# Patient Record
Sex: Male | Born: 1995
Health system: Southern US, Community
[De-identification: ages and names within clinical notes are randomized; demographics above are authoritative.]

## PROBLEM LIST (undated history)

## (undated) DIAGNOSIS — F419 Anxiety disorder, unspecified: Secondary | ICD-10-CM

## (undated) DIAGNOSIS — IMO0001 Reserved for inherently not codable concepts without codable children: Secondary | ICD-10-CM

## (undated) DIAGNOSIS — K219 Gastro-esophageal reflux disease without esophagitis: Secondary | ICD-10-CM

## (undated) HISTORY — DX: Anxiety disorder, unspecified: F41.9

## (undated) HISTORY — PX: WISDOM TOOTH EXTRACTION: SHX21

## (undated) HISTORY — PX: TONSILLECTOMY: SUR1361

---
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
## 2016-02-13 ENCOUNTER — Emergency Department (HOSPITAL_BASED_OUTPATIENT_CLINIC_OR_DEPARTMENT_OTHER)
Admission: EM | Admit: 2016-02-13 | Discharge: 2016-02-13 | Disposition: A | Discharge status: Home / Self Care | Payer: 59 | Attending: Emergency Medicine | Admitting: Emergency Medicine

## 2016-02-13 ENCOUNTER — Encounter (HOSPITAL_BASED_OUTPATIENT_CLINIC_OR_DEPARTMENT_OTHER): Payer: Self-pay | Admitting: *Deleted

## 2016-02-13 DIAGNOSIS — H66001 Acute suppurative otitis media without spontaneous rupture of ear drum, right ear: Secondary | ICD-10-CM | POA: Diagnosis not present

## 2016-02-13 DIAGNOSIS — Z79899 Other long term (current) drug therapy: Secondary | ICD-10-CM | POA: Insufficient documentation

## 2016-02-13 DIAGNOSIS — H9201 Otalgia, right ear: Secondary | ICD-10-CM | POA: Diagnosis present

## 2016-02-13 HISTORY — DX: Reserved for inherently not codable concepts without codable children: IMO0001

## 2016-02-13 HISTORY — DX: Gastro-esophageal reflux disease without esophagitis: K21.9

## 2016-02-13 MED ORDER — HYDROCODONE-ACETAMINOPHEN 5-325 MG PO TABS: 1.0000 | ORAL_TABLET | ORAL | 0 refills | Status: DC | PRN

## 2016-02-13 MED ORDER — AMOXICILLIN 500 MG PO CAPS: 500.0000 mg | ORAL_CAPSULE | Freq: Three times a day (TID) | ORAL | 0 refills | Status: DC

## 2016-02-13 MED ORDER — IBUPROFEN 800 MG PO TABS
800.0000 mg | ORAL_TABLET | Freq: Once | ORAL | Status: AC
  Administered 2016-02-13: 800 mg via ORAL
  Filled 2016-02-13: qty 1

## 2016-02-13 MED ORDER — AMOXICILLIN 500 MG PO CAPS
500.0000 mg | ORAL_CAPSULE | Freq: Once | ORAL | Status: AC
  Administered 2016-02-13: 500 mg via ORAL
  Filled 2016-02-13: qty 1

## 2016-02-13 MED ORDER — IBUPROFEN 800 MG PO TABS: 800.0000 mg | ORAL_TABLET | Freq: Three times a day (TID) | ORAL | 0 refills | Status: DC

## 2016-02-13 NOTE — ED Provider Notes (Signed)
[facility]   CSN: [id number] Arrival date & time: 02/13/16  0807  First Provider Contact:  None       History   Chief Complaint Chief Complaint  Patient presents with  . Otalgia    right    HPI Tracy Rich is a 20 y.o. male.  Pt said that he woke up around 0300 with right ear pain.  He said that he felt a loud pop.  He denies any trouble hearing.  He said that the pain is severe.      Past Medical History:  Diagnosis Date  . Reflux     There are no active problems to display for this patient.   Past Surgical History:  Procedure Laterality Date  . TONSILLECTOMY    . WISDOM TOOTH EXTRACTION         Home Medications    Prior to Admission medications   Medication Sig Start Date End Date Taking? Authorizing Provider  RaNITidine HCl (ZANTAC PO) Take by mouth.   Yes Historical Provider, MD  amoxicillin (AMOXIL) 500 MG capsule Take 1 capsule (500 mg total) by mouth 3 (three) times daily. 02/13/16   Jacalyn Lefevre, MD  HYDROcodone-acetaminophen (NORCO/VICODIN) 5-325 MG tablet Take 1 tablet by mouth every 4 (four) hours as needed. 02/13/16   Jacalyn Lefevre, MD  ibuprofen (ADVIL,MOTRIN) 800 MG tablet Take 1 tablet (800 mg total) by mouth 3 (three) times daily. 02/13/16   Jacalyn Lefevre, MD    Family History No family history on file.  Social History Social History  Substance Use Topics  . Smoking status: Not on file  . Smokeless tobacco: Not on file  . Alcohol use Not on file     Allergies   Review of patient's allergies indicates no known allergies.   Review of Systems Review of Systems  HENT: Positive for ear pain.   All other systems reviewed and are negative.    Physical Exam Updated Vital Signs BP 129/99 (BP Location: Right Arm)   Pulse 73   Temp 98.2 F (36.8 C)   Resp 22   Ht 6' (1.829 m)   Wt 250 lb (113.4 kg)   SpO2 100%   BMI 33.91 kg/m   Physical Exam  Constitutional: He appears well-developed and  well-nourished.  HENT:  Head: Normocephalic and atraumatic.  Right Ear: External ear normal. Tympanic membrane is injected, erythematous and bulging. Tympanic membrane is not perforated.  Left Ear: Tympanic membrane and external ear normal.  Nose: Nose normal.  Mouth/Throat: Oropharynx is clear and moist.  Eyes: Conjunctivae and EOM are normal. Pupils are equal, round, and reactive to light.  Neck: Normal range of motion. Neck supple.  Cardiovascular: Normal rate, regular rhythm, normal heart sounds and intact distal pulses.   Pulmonary/Chest: Effort normal and breath sounds normal.  Abdominal: Soft. Bowel sounds are normal.  Nursing note and vitals reviewed.    ED Treatments / Results  Labs (all labs ordered are listed, but only abnormal results are displayed) Labs Reviewed - No data to display  EKG  EKG Interpretation None       Radiology No results found.  Procedures Procedures (including critical care time)  Medications Ordered in ED Medications  amoxicillin (AMOXIL) capsule 500 mg (not administered)  ibuprofen (ADVIL,MOTRIN) tablet 800 mg (not administered)     Initial Impression / Assessment and Plan / ED Course  I have reviewed the triage vital signs and the nursing notes.  Pertinent labs & imaging  results that were available during my care of the patient were reviewed by me and considered in my medical decision making (see chart for details).  Clinical Course   Pt will be placed on amox and is given a rx for lortab and ibuprofen for pain.  Return if worse.  Final Clinical Impressions(s) / ED Diagnoses   Final diagnoses:  Acute suppurative otitis media of right ear without spontaneous rupture of tympanic membrane, recurrence not specified    New Prescriptions New Prescriptions   AMOXICILLIN (AMOXIL) 500 MG CAPSULE    Take 1 capsule (500 mg total) by mouth 3 (three) times daily.   HYDROCODONE-ACETAMINOPHEN (NORCO/VICODIN) 5-325 MG TABLET    Take 1  tablet by mouth every 4 (four) hours as needed.   IBUPROFEN (ADVIL,MOTRIN) 800 MG TABLET    Take 1 tablet (800 mg total) by mouth 3 (three) times daily.     Jacalyn LefevreJulie Rohen Kimes, MD 02/13/16 [phone]

## 2016-02-13 NOTE — ED Triage Notes (Signed)
Patient states approximately 2 hours ago while sleeping, he was awakened by a loud pop in his right ear, and now has severe pain.

## 2016-09-06 DIAGNOSIS — J029 Acute pharyngitis, unspecified: Secondary | ICD-10-CM | POA: Diagnosis not present

## 2016-11-16 DIAGNOSIS — L0390 Cellulitis, unspecified: Secondary | ICD-10-CM | POA: Diagnosis not present

## 2016-11-16 DIAGNOSIS — B9562 Methicillin resistant Staphylococcus aureus infection as the cause of diseases classified elsewhere: Secondary | ICD-10-CM | POA: Diagnosis not present

## 2016-11-16 DIAGNOSIS — L0291 Cutaneous abscess, unspecified: Secondary | ICD-10-CM | POA: Diagnosis not present

## 2016-11-22 DIAGNOSIS — L02416 Cutaneous abscess of left lower limb: Secondary | ICD-10-CM | POA: Diagnosis not present

## 2017-01-27 DIAGNOSIS — J019 Acute sinusitis, unspecified: Secondary | ICD-10-CM | POA: Diagnosis not present

## 2017-01-27 DIAGNOSIS — H6693 Otitis media, unspecified, bilateral: Secondary | ICD-10-CM | POA: Diagnosis not present

## 2017-02-20 DIAGNOSIS — L02416 Cutaneous abscess of left lower limb: Secondary | ICD-10-CM | POA: Diagnosis not present

## 2017-02-20 DIAGNOSIS — R062 Wheezing: Secondary | ICD-10-CM | POA: Diagnosis not present

## 2017-02-21 ENCOUNTER — Emergency Department
Admission: EM | Admit: 2017-02-21 | Discharge: 2017-02-21 | Disposition: A | Discharge status: Home / Self Care | Payer: 59 | Source: Home / Self Care | Attending: Family Medicine | Admitting: Family Medicine

## 2017-02-21 ENCOUNTER — Encounter: Payer: Self-pay | Admitting: Emergency Medicine

## 2017-02-21 DIAGNOSIS — L02416 Cutaneous abscess of left lower limb: Secondary | ICD-10-CM | POA: Diagnosis not present

## 2017-02-21 MED ORDER — HYDROCODONE-ACETAMINOPHEN 5-325 MG PO TABS: 1.0000 | ORAL_TABLET | ORAL | 0 refills | Status: DC | PRN

## 2017-02-21 NOTE — ED Provider Notes (Signed)
[facility]    CSN: [id number] Arrival date & time: 02/21/17  1736     History   Chief Complaint Chief Complaint  Patient presents with  . Abscess    HPI Tracy Rich is a 21 y.o. male.   HPI Tracy Rich is a 21 y.o. male presenting to UC with c/o gradually worsening abscess of Left medial thigh that started about 3 days ago.  He [facility] at [facility] yesterday and prescribed Clindamycin.  He has been taking as prescribed and using warm compresses but states the pain is only worsening.  Denies fever, chills, n/v/d. 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  He has had them drained in the past.    Past Medical History:  Diagnosis Date  . Reflux     There are no active problems to display for this patient.   Past Surgical History:  Procedure Laterality Date  . TONSILLECTOMY    . WISDOM TOOTH EXTRACTION         Home Medications    Prior to Admission medications   Medication Sig Start Date End Date Taking? Authorizing Provider  HYDROcodone-acetaminophen (NORCO/VICODIN) 5-325 MG tablet Take 1-2 tablets by mouth every 4 (four) hours as needed for severe pain. 02/21/17   Lurene Shadow, PA-C  ibuprofen (ADVIL,MOTRIN) 800 MG tablet Take 1 tablet (800 mg total) by mouth 3 (three) times daily. 02/13/16   Jacalyn Lefevre, MD  RaNITidine HCl (ZANTAC PO) Take by mouth.    [provider]    Family History History reviewed. No pertinent family history.  Social History Social History  Substance Use Topics  . Smoking status: Former Games developer  . Smokeless tobacco: Never Used  . Alcohol use No     Allergies   Patient has no known allergies.   Review of Systems Review of Systems  Constitutional: Negative for chills and fever.  Gastrointestinal: Positive for nausea. Negative for diarrhea and vomiting.  Skin: Positive for color change and wound. Negative for rash.     Physical Exam Triage Vital Signs ED Triage Vitals  Enc Vitals Group    BP 02/21/17 1756 129/90     Pulse Rate 02/21/17 1756 87     Resp --      Temp 02/21/17 1756 98.1 F (36.7 C)     Temp Source 02/21/17 1756 Oral     SpO2 02/21/17 1756 100 %     Weight 02/21/17 1757 260 lb (117.9 kg)     Height 02/21/17 1757 6\' 1"  (1.854 m)     Head Circumference --      Peak Flow --      Pain Score 02/21/17 1757 8     Pain Loc --      Pain Edu? --      Excl. in GC? --    No data found.   Updated Vital Signs BP 129/90 (BP Location: Left Arm)   Pulse 87   Temp 98.1 F (36.7 C) (Oral)   Ht 6\' 1"  (1.854 m)   Wt 260 lb (117.9 kg)   SpO2 100%   BMI 34.30 kg/m   Visual Acuity Right Eye Distance:   Left Eye Distance:   Bilateral Distance:    Right Eye Near:   Left Eye Near:    Bilateral Near:     Physical Exam  Constitutional: He is oriented to person, place, and time. He appears well-developed and well-nourished. No distress.  HENT:  Head: Normocephalic and  atraumatic.  Eyes: EOM are normal.  Neck: Normal range of motion.  Cardiovascular: Normal rate and regular rhythm.   Pulmonary/Chest: Effort normal.  Musculoskeletal: Normal range of motion.  Neurological: He is alert and oriented to person, place, and time.  Skin: Skin is warm and dry. He is not diaphoretic. There is erythema.  Left medial thigh: 3-4cm area of erythema, induration and tenderness with 1cm centralized fluctuance. Skin in tact. No active bleeding or drainage.   Psychiatric: He has a normal mood and affect. His behavior is normal.  Nursing note and vitals reviewed.    UC Treatments / Results  Labs (all labs ordered are listed, but only abnormal results are displayed) Labs Reviewed  WOUND CULTURE    EKG  EKG Interpretation None       Radiology No results found.  Procedures .Marland KitchenIncision and Drainage Date/Time: 02/21/2017 6:26 PM Performed by: Lurene Shadow Authorized by: Donna Christen A   Consent:    Consent obtained:  Verbal   Consent given by:  Patient    Risks discussed:  Incomplete drainage, infection, bleeding and pain   Alternatives discussed:  Delayed treatment Location:    Type:  Abscess   Size:  3.5cm   Location:  Lower extremity   Lower extremity location:  Leg   Leg location:  L upper leg Pre-procedure details:    Skin preparation:  Betadine Anesthesia (see MAR for exact dosages):    Anesthesia method:  Topical application and local infiltration   Topical anesthesia: freeze spray.   Local anesthetic:  Lidocaine 1% WITH epi Procedure type:    Complexity:  Simple Procedure details:    Incision types:  Single straight   Incision depth:  Dermal   Scalpel blade:  11   Wound management:  Probed and deloculated and irrigated with saline   Drainage:  Bloody and purulent   Drainage amount:  Moderate   Wound treatment:  Wound left open   Packing materials:  None Post-procedure details:    Patient tolerance of procedure:  Tolerated well, no immediate complications   (including critical care time)  Medications Ordered in UC Medications - No data to display   Initial Impression / Assessment and Plan / UC Course  I have reviewed the triage vital signs and the nursing notes.  Pertinent labs & imaging results that were available during my care of the patient were reviewed by me and considered in my medical decision making (see chart for details).     Hx and exam c/w cellulitis and abscess. I&D successfully performed. Wound culture sent. Encouraged to keep taking Clindamycin as prescribed. Continue warm compresses.  May take Norco as needed for severe pain.   Final Clinical Impressions(s) / UC Diagnoses   Final diagnoses:  Abscess of left thigh   Will notify patient of culture report when it results. Encouraged pt to call in 3-4 days if he has not heard back from [place]. May f/u in 3-4 days if not improving, sooner if significantly worsening.   New Prescriptions New Prescriptions   HYDROCODONE-ACETAMINOPHEN (NORCO/VICODIN)  5-325 MG TABLET    Take 1-2 tablets by mouth every 4 (four) hours as needed for severe pain.     Controlled Substance Prescriptions [place] Controlled Substance Registry consulted? Yes, I have consulted the [place] Controlled Substances Registry for this patient, and feel the risk/benefit ratio today is favorable for proceeding with this prescription for a controlled substance.   Lurene Shadow, [place] 02/21/17 [phone]

## 2017-02-21 NOTE — Discharge Instructions (Signed)
°  The culture results should come back in about 3 days, if you do not hear back from our office by this weekend, feel free to call to check on your results.    Norco/Vicodin (hydrocodone-acetaminophen) is a narcotic pain medication, do not combine these medications with others containing tylenol. While taking, do not drink alcohol, drive, or perform any other activities that requires focus while taking these medications.

## 2017-02-21 NOTE — ED Triage Notes (Signed)
Abscess on back of upper left thigh x 3 days went to [facility] yesterday and was given Clindimycin

## 2017-02-24 ENCOUNTER — Telehealth: Payer: Self-pay | Admitting: Emergency Medicine

## 2017-02-24 LAB — WOUND CULTURE
Gram Stain: NONE SEEN
Gram Stain: NONE SEEN
Organism ID, Bacteria: NORMAL

## 2017-02-24 NOTE — Telephone Encounter (Signed)
Spoke to patient about his lab culture, he remains on antibiotics and he states that he is doing much better, he was advised that if he did not continue to improve or if he got worse he would need to follow up either at [facility] or his PCP. Patient verbalized an understanding.

## 2017-02-24 NOTE — Telephone Encounter (Signed)
Spoke to patient about his lab culture, he remains on antibiotics and he states that he is doing much better, he was advised that if he did not continue to improve or if he got worse he would need to follow up either at [facility] or his PCP. Patient verbalized an understanding.  

## 2017-04-02 DIAGNOSIS — L732 Hidradenitis suppurativa: Secondary | ICD-10-CM | POA: Diagnosis not present

## 2019-03-14 ENCOUNTER — Other Ambulatory Visit: Payer: Self-pay

## 2019-03-17 ENCOUNTER — Encounter: Payer: Self-pay | Admitting: Medical

## 2019-03-17 ENCOUNTER — Ambulatory Visit: Payer: Self-pay | Admitting: Family

## 2019-03-17 ENCOUNTER — Other Ambulatory Visit: Payer: Self-pay

## 2019-03-17 ENCOUNTER — Ambulatory Visit (INDEPENDENT_AMBULATORY_CARE_PROVIDER_SITE_OTHER): Payer: No Typology Code available for payment source | Admitting: Medical

## 2019-03-17 VITALS — BP 130/80 | HR 62 | Ht 72.0 in | Wt 276.0 lb

## 2019-03-17 DIAGNOSIS — F32A Depression, unspecified: Secondary | ICD-10-CM

## 2019-03-17 DIAGNOSIS — K219 Gastro-esophageal reflux disease without esophagitis: Secondary | ICD-10-CM

## 2019-03-17 DIAGNOSIS — Z0001 Encounter for general adult medical examination with abnormal findings: Secondary | ICD-10-CM

## 2019-03-17 DIAGNOSIS — F419 Anxiety disorder, unspecified: Secondary | ICD-10-CM

## 2019-03-17 DIAGNOSIS — Z0000 Encounter for general adult medical examination without abnormal findings: Secondary | ICD-10-CM

## 2019-03-17 DIAGNOSIS — F329 Major depressive disorder, single episode, unspecified: Secondary | ICD-10-CM

## 2019-03-17 DIAGNOSIS — Z8349 Family history of other endocrine, nutritional and metabolic diseases: Secondary | ICD-10-CM | POA: Diagnosis not present

## 2019-03-17 DIAGNOSIS — R030 Elevated blood-pressure reading, without diagnosis of hypertension: Secondary | ICD-10-CM

## 2019-03-17 LAB — LIPID PANEL
Cholesterol: 145 mg/dL (ref 0–200)
HDL: 46.6 mg/dL (ref >39.00)
LDL Cholesterol: 71 mg/dL (ref 0–99)
NonHDL: 98.81
Total CHOL/HDL Ratio: 3
Triglycerides: 137 mg/dL (ref 0.0–149.0)
VLDL: 27.4 mg/dL (ref 0.0–40.0)

## 2019-03-17 LAB — COMPREHENSIVE METABOLIC PANEL
ALT: 19 U/L (ref 0–53)
AST: 17 U/L (ref 0–37)
Albumin: 4.6 g/dL (ref 3.5–5.2)
Alkaline Phosphatase: 74 U/L (ref 39–117)
BUN: 9 mg/dL (ref 6–23)
CO2: 28 mEq/L (ref 19–32)
Calcium: 9.5 mg/dL (ref 8.4–10.5)
Chloride: 104 mEq/L (ref 96–112)
Creatinine, Ser: 1.01 mg/dL (ref 0.40–1.50)
GFR: 92.28 mL/min (ref >60.00)
Glucose, Bld: 97 mg/dL (ref 70–99)
Potassium: 4.8 mEq/L (ref 3.5–5.1)
Sodium: 138 mEq/L (ref 135–145)
Total Bilirubin: 1 mg/dL (ref 0.2–1.2)
Total Protein: 6.7 g/dL (ref 6.0–8.3)

## 2019-03-17 LAB — CBC WITH DIFFERENTIAL/PLATELET
Basophils Absolute: 0.1 10*3/uL (ref 0.0–0.1)
Basophils Relative: 1.1 % (ref 0.0–3.0)
Eosinophils Absolute: 0.5 10*3/uL (ref 0.0–0.7)
Eosinophils Relative: 6.7 % — ABNORMAL HIGH (ref 0.0–5.0)
HCT: 45.2 % (ref 39.0–52.0)
Hemoglobin: 15.1 g/dL (ref 13.0–17.0)
Lymphocytes Relative: 30.5 % (ref 12.0–46.0)
Lymphs Abs: 2.1 10*3/uL (ref 0.7–4.0)
MCHC: 33.4 g/dL (ref 30.0–36.0)
MCV: 92.5 fl (ref 78.0–100.0)
Monocytes Absolute: 0.6 10*3/uL (ref 0.1–1.0)
Monocytes Relative: 9.2 % (ref 3.0–12.0)
Neutro Abs: 3.7 10*3/uL (ref 1.4–7.7)
Neutrophils Relative %: 52.5 % (ref 43.0–77.0)
Platelets: 216 10*3/uL (ref 150.0–400.0)
RBC: 4.89 Mil/uL (ref 4.22–5.81)
RDW: 12.9 % (ref 11.5–15.5)
WBC: 7 10*3/uL (ref 4.0–10.5)

## 2019-03-17 LAB — TSH: TSH: 2.82 u[IU]/mL (ref 0.35–4.50)

## 2019-03-17 MED ORDER — HYDROXYZINE HCL 10 MG PO TABS: 10.0000 mg | ORAL_TABLET | Freq: Three times a day (TID) | ORAL | 0 refills | Status: DC | PRN

## 2019-03-17 MED FILL — hydrOXYzine HCL 10 MG TABS: 10 | 2 days supply | Qty: 6 | Fill #0

## 2019-03-17 NOTE — Patient Instructions (Addendum)
For you wellness exam today I have ordered cbc, cmp, lipid panel and tsh.   Flu vaccine declined.   Recommend exercise and healthy diet.  We will let you know lab results as they come in.  Follow up date appointment will be determined after lab review.   For depression, I want you to call behavioral health and get scheduled. Give me update on which office you called so I can ask our staff to then send over the referral. But we need to know who you want to see.  If you get thoughts of harm to self or others then advise ED evaluation [place].  For anxiety low dose hydroxzine. Rx advisement on sedation.  For gerd, healthy diet. Use tums. If needed to can add on pepcid otc.  For elevated bp checked daily when not anxious. Anxiety can effect bp so we want to know how bp is when not anxious.  Follow up in 2 weeks or as needed   Preventive Care 5-46 Years Old, Male Preventive care refers to lifestyle choices and visits with your health care provider that can promote health and wellness. This includes:  A yearly physical exam. This is also called an annual well check.  Regular dental and eye exams.  Immunizations.  Screening for certain conditions.  Healthy lifestyle choices, such as eating a healthy diet, getting regular exercise, not using drugs or products that contain nicotine and tobacco, and limiting alcohol use. What can I expect for my preventive care visit? Physical exam Your health care provider will check:  Height and weight. These may be used to calculate body mass index (BMI), which is a measurement that tells if you are at a healthy weight.  Heart rate and blood pressure.  Your skin for abnormal spots. Counseling Your health care provider may ask you questions about:  Alcohol, tobacco, and drug use.  Emotional well-being.  Home and relationship well-being.  Sexual activity.  Eating habits.  Work and work Statistician. What immunizations do I  need?  Influenza (flu) vaccine  This is recommended every year. Tetanus, diphtheria, and pertussis (Tdap) vaccine  You may need a Td booster every 10 years. Varicella (chickenpox) vaccine  You may need this vaccine if you have not already been vaccinated. Human papillomavirus (HPV) vaccine  If recommended by your health care provider, you may need three doses over 6 months. Measles, mumps, and rubella (MMR) vaccine  You may need at least one dose of MMR. You may also need a second dose. Meningococcal conjugate (MenACWY) vaccine  One dose is recommended if you are 75-77 years old and a Market researcher living in a residence hall, or if you have one of several medical conditions. You may also need additional booster doses. Pneumococcal conjugate (PCV13) vaccine  You may need this if you have certain conditions and were not previously vaccinated. Pneumococcal polysaccharide (PPSV23) vaccine  You may need one or two doses if you smoke cigarettes or if you have certain conditions. Hepatitis A vaccine  You may need this if you have certain conditions or if you travel or work in places where you may be exposed to hepatitis A. Hepatitis B vaccine  You may need this if you have certain conditions or if you travel or work in places where you may be exposed to hepatitis B. Haemophilus influenzae type b (Hib) vaccine  You may need this if you have certain risk factors. You may receive vaccines as individual doses or as more than  one vaccine together in one shot (combination vaccines). Talk with your health care provider about the risks and benefits of combination vaccines. What tests do I need? Blood tests  Lipid and cholesterol levels. These may be checked every 5 years starting at age 49.  Hepatitis C test.  Hepatitis B test. Screening   Diabetes screening. This is done by checking your blood sugar (glucose) after you have not eaten for a while (fasting).  Sexually  transmitted disease (STD) testing. Talk with your health care provider about your test results, treatment options, and if necessary, the need for more tests. Follow these instructions at home: Eating and drinking   Eat a diet that includes fresh fruits and vegetables, whole grains, lean protein, and low-fat dairy products.  Take vitamin and mineral supplements as recommended by your health care provider.  Do not drink alcohol if your health care provider tells you not to drink.  If you drink alcohol: ? Limit how much you have to 0-2 drinks a day. ? Be aware of how much alcohol is in your drink. In the [place] one drink equals one 12 oz bottle of beer (355 mL), one 5 oz glass of wine (148 mL), or one 1 oz glass of hard liquor (44 mL). Lifestyle  Take daily care of your teeth and gums.  Stay active. Exercise for at least 30 minutes on 5 or more days each week.  Do not use any products that contain nicotine or tobacco, such as cigarettes, e-cigarettes, and chewing tobacco. If you need help quitting, ask your health care provider.  If you are sexually active, practice safe sex. Use a condom or other form of protection to prevent STIs (sexually transmitted infections). What's next?  Go to your health care provider once a year for a well check visit.  Ask your health care provider how often you should have your eyes and teeth checked.  Stay up to date on all vaccines. This information is not intended to replace advice given to you by your health care provider. Make sure you discuss any questions you have with your health care provider. Document Released: 08/15/2001 Document Revised: 06/13/2018 Document Reviewed: 06/13/2018 Elsevier Patient Education  2020 [employer].

## 2019-03-17 NOTE — Progress Notes (Signed)
Subjective:    Patient ID: Tracy Rich, male    DOB: 1996-01-16, 23 y.o.   MRN: [id number]  HPI  Pt here for first time. Has not been evaluated since was in pediatrician office.  Pt works part time for [employer]. He does not exercise. Nonsmoker. Used to but smoke but stopped 2 weeks ago. No alcohol use.    Pt has history of gerd symptoms since he was youth. Pt used to be on zantac for years until was recalled. He also omeprazole in the past. He now he only uses Tums. Tums helps and gets reflux 1-2 times every 2 weeks.  Pt also states he has hx of depression in the past. He states some anxiety recently as well. He states no evauuation in the past. He never told anyone about this in the past. He states bipolar, anxiety and depression strong family history.   Family history of diabetes. Dad.  Pt in the has high blood pressure at homes. His family has been checking. Mom remembers his systolic was in 355 range 3 consecutive times. The days that she checked he was very nervous.    Pt does not want flu vaccine.   Review of Systems  Constitutional: Negative for chills, fatigue and fever.  Respiratory: Negative for cough, chest tightness, shortness of breath, wheezing and stridor.   Cardiovascular: Negative for chest pain and palpitations.  Gastrointestinal: Negative for abdominal pain, constipation, nausea and vomiting.  Genitourinary: Negative for dysuria, flank pain and frequency.  Musculoskeletal: Negative for back pain, joint swelling, myalgias and neck stiffness.  Skin: Negative for pallor and rash.  Neurological: Negative for dizziness, speech difficulty, weakness, numbness and headaches.  Hematological: Negative for adenopathy. Does not bruise/bleed easily.  Psychiatric/Behavioral: Positive for dysphoric mood. Negative for agitation, self-injury, sleep disturbance and suicidal ideas. The patient is nervous/anxious.        2 weeks ago he said his mood was severely. Depressed. He was  thinking of admitting himself.  Transient thought back then of harm to self but not others.     Past Medical History:  Diagnosis Date  . Reflux      Social History   Socioeconomic History  . Marital status: Single    Spouse name: Not on file  . Number of children: Not on file  . Years of education: Not on file  . Highest education level: Not on file  Occupational History  . Not on file  Social Needs  . Financial resource strain: Not on file  . Food insecurity    Worry: Not on file    Inability: Not on file  . Transportation needs    Medical: Not on file    Non-medical: Not on file  Tobacco Use  . Smoking status: Former Research scientist (life sciences)  . Smokeless tobacco: Never Used  Substance and Sexual Activity  . Alcohol use: No  . Drug use: No  . Sexual activity: Not on file  Lifestyle  . Physical activity    Days per week: Not on file    Minutes per session: Not on file  . Stress: Not on file  Relationships  . Social Herbalist on phone: Not on file    Gets together: Not on file    Attends religious service: Not on file    Active member of club or organization: Not on file    Attends meetings of clubs or organizations: Not on file    Relationship status: Not on file  .  Intimate partner violence    Fear of current or ex partner: Not on file    Emotionally abused: Not on file    Physically abused: Not on file    Forced sexual activity: Not on file  Other Topics Concern  . Not on file  Social History Narrative  . Not on file    Past Surgical History:  Procedure Laterality Date  . TONSILLECTOMY    . WISDOM TOOTH EXTRACTION      No family history on file.  No Known Allergies  Current Outpatient Medications on File Prior to Visit  Medication Sig Dispense Refill  . HYDROcodone-acetaminophen (NORCO/VICODIN) 5-325 MG tablet Take 1-2 tablets by mouth every 4 (four) hours as needed for severe pain. 8 tablet 0  . ibuprofen (ADVIL,MOTRIN) 800 MG tablet Take 1 tablet  (800 mg total) by mouth 3 (three) times daily. 21 tablet 0  . RaNITidine HCl (ZANTAC PO) Take by mouth.     No current facility-administered medications on file prior to visit.     BP 139/85   Pulse 62   Ht 6' (1.829 m)   Wt 276 lb (125.2 kg)   SpO2 97%   BMI 37.43 kg/m       Objective:   Physical Exam  General Mental Status- Alert. General Appearance- Not in acute distress.   Skin General: Color- Normal Color. Moisture- Normal Moisture.  Neck Carotid Arteries- Normal color. Moisture- Normal Moisture. No carotid bruits. No JVD.  Chest and Lung Exam Auscultation: Breath Sounds:-Normal.  Cardiovascular Auscultation:Rythm- Regular. Murmurs & Other Heart Sounds:Auscultation of the heart reveals- No Murmurs.  Abdomen Inspection:-Inspeection Normal. Palpation/Percussion:Note:No mass. Palpation and Percussion of the abdomen reveal- Non Tender, Non Distended + BS, no rebound or guarding.   Neurologic Cranial Nerve exam:- CN III-XII intact(No nystagmus), symmetric smile. Strength:- 5/5 equal and symmetric strength both upper and lower extremities.      Assessment & Plan:  For you wellness exam today I have ordered cbc, cmp, lipid panel and tsh.   Flu vaccine declined.   Recommend exercise and healthy diet.  We will let you know lab results as they come in.  Follow up date appointment will be determined after lab review.   For depression, I want you to call behavioral health and get scheduled. Give me update on which office you called so I can ask our staff to then send over the referral. But we need to know who you want to see.  If you get thoughts of harm to self or others then advise ED evaluation [place].  For anxiety low dose hydroxzine. Rx advisement on sedation.  For gerd, healthy diet. Use tums. If needed to can add on pepcid otc.  For elevated bp checked daily when not anxious. Anxiety can effect bp so we want to know how bp is when not anxious.   Follow up in 2 weeks or as needed   [id number] in addition to wellness as addressed depression, anxiety, gerd and elevated bp. New pt and on appointment note he wanted cpe. Blood work would be beneficial so decided to go ahead and to do wellness as well.  Esperanza RichtersEdward Constanza Mincy, PA-C

## 2019-04-01 ENCOUNTER — Ambulatory Visit: Payer: Self-pay | Admitting: Family

## 2019-04-02 ENCOUNTER — Encounter: Payer: Self-pay | Admitting: Medical

## 2019-04-02 ENCOUNTER — Ambulatory Visit (INDEPENDENT_AMBULATORY_CARE_PROVIDER_SITE_OTHER): Payer: No Typology Code available for payment source | Admitting: Medical

## 2019-04-02 ENCOUNTER — Other Ambulatory Visit: Payer: Self-pay

## 2019-04-02 VITALS — BP 130/95 | HR 80 | Temp 97.4°F | Resp 16 | Ht 72.0 in | Wt 277.0 lb

## 2019-04-02 DIAGNOSIS — F419 Anxiety disorder, unspecified: Secondary | ICD-10-CM

## 2019-04-02 DIAGNOSIS — F32A Depression, unspecified: Secondary | ICD-10-CM

## 2019-04-02 DIAGNOSIS — F329 Major depressive disorder, single episode, unspecified: Secondary | ICD-10-CM | POA: Diagnosis not present

## 2019-04-02 MED ORDER — BUSPIRONE HCL 7.5 MG PO TABS: 7.5000 mg | ORAL_TABLET | Freq: Two times a day (BID) | ORAL | 0 refills | Status: DC

## 2019-04-02 MED ORDER — DOXYCYCLINE HYCLATE 100 MG PO TABS: ORAL_TABLET | ORAL | 0 refills | Status: DC

## 2019-04-02 MED ORDER — BUSPIRONE HCL 7.5 MG PO TABS
7.5000 mg | ORAL_TABLET | Freq: Two times a day (BID) | ORAL | 0 refills | Status: DC
Start: 2019-04-02 — End: 2019-04-02

## 2019-04-02 MED FILL — BUSPIRONE HCL 7.5 MG TABS: 7.5 | 30 days supply | Qty: 60 | Fill #0

## 2019-04-02 MED FILL — DOXYCYCLINE HYCLATE 100 MG: 100 | 14 days supply | Qty: 28 | Fill #0

## 2019-04-02 NOTE — Progress Notes (Signed)
Subjective:    Patient ID: Tracy Rich, male    DOB: 17-Mar-1996, 23 y.o.   MRN: [id number]  HPI  Patient is here for follow-up.  He was started on hydroxyzine 2 weeks ago for anxiety. Patient states he has not noticed any improvement. He has not slept in 2 days and reports having a panic attack yesterday that did not improve with the medication. He has taken 3 out of the 6 pills he was prescribed when he was having panic attacks and reports it didn't help any of those times.   Pt expresses he did not call behavioral health/psychiatrist as I had asked him to after explaining benefit and reasoning. He not sure why he had not called. He then explains mood is not as bad as it was but still having panic episodes. I had explained for depression component I would rather psychiatrist treat in his age range. Explained warning on ssri in pt 70-24 yr old.  Denies any specific triggers to the panic attacks. Reports he does have occasional depressive episodes, but it has not happened in several weeks. He was given a list of psych referrals to call, but he states he is not comfortable talking about it. No suicidal ideation today.  Reports his mother's side of the family has a long history of anxiety and depression.  Reports he needs another round of doxycycline. States he was taking it BID for 6 months for pustules to upper leg per a provider at [facility]. State it has been about 2 years since seeing a dermatologist. On chart review, dermatologist visit in 2018 was diagnosed with hidradenitis suppurativa.     Review of Systems  Constitutional: Negative for chills, fatigue and fever.  HENT: Negative for congestion and sore throat.   Respiratory: Negative for chest tightness and shortness of breath.   Cardiovascular: Negative for chest pain and palpitations.  Gastrointestinal: Negative for abdominal pain, constipation, diarrhea and nausea.  Genitourinary: Negative for frequency, hematuria and urgency.   Skin: Positive for rash.       Medial upper legs with chronic pustule condition. Started about 4 years ago. Pus/bloody discharge when they open   Neurological: Negative for dizziness, light-headedness and headaches.  Psychiatric/Behavioral: Positive for sleep disturbance. Negative for suicidal ideas. The patient is nervous/anxious.        No thoughts of harm to self or others.     Past Medical History:  Diagnosis Date  . Reflux      Social History   Socioeconomic History  . Marital status: Single    Spouse name: Not on file  . Number of children: Not on file  . Years of education: Not on file  . Highest education level: Not on file  Occupational History  . Not on file  Social Needs  . Financial resource strain: Not on file  . Food insecurity    Worry: Not on file    Inability: Not on file  . Transportation needs    Medical: Not on file    Non-medical: Not on file  Tobacco Use  . Smoking status: Former Games developer  . Smokeless tobacco: Never Used  Substance and Sexual Activity  . Alcohol use: No  . Drug use: No  . Sexual activity: Not on file  Lifestyle  . Physical activity    Days per week: Not on file    Minutes per session: Not on file  . Stress: Not on file  Relationships  . Social connections  Talks on phone: Not on file    Gets together: Not on file    Attends religious service: Not on file    Active member of club or organization: Not on file    Attends meetings of clubs or organizations: Not on file    Relationship status: Not on file  . Intimate partner violence    Fear of current or ex partner: Not on file    Emotionally abused: Not on file    Physically abused: Not on file    Forced sexual activity: Not on file  Other Topics Concern  . Not on file  Social History Narrative  . Not on file    Past Surgical History:  Procedure Laterality Date  . TONSILLECTOMY    . WISDOM TOOTH EXTRACTION      No family history on file.  No Known Allergies   Current Outpatient Medications on File Prior to Visit  Medication Sig Dispense Refill  . hydrOXYzine (ATARAX/VISTARIL) 10 MG tablet Take 1 tablet (10 mg total) by mouth 3 (three) times daily as needed for anxiety. 6 tablet 0   No current facility-administered medications on file prior to visit.     BP (!) 130/95   Pulse 80   Temp (!) 97.4 F (36.3 C) (Temporal)   Resp 16   Ht 6' (1.829 m)   Wt 277 lb (125.6 kg)   SpO2 99%   BMI 37.57 kg/m       Objective:   Physical Exam  General Mental Status- Alert. General Appearance- Not in acute distress.   Skin General: Color- Normal Color. Moisture- Normal Moisture.  Both sides of thigh medial aspect follicles inflamed with some hyperpigmentation. No tenderness presently.  Neck Carotid Arteries- Normal color. Moisture- Normal Moisture. No carotid bruits. No JVD.  Chest and Lung Exam Auscultation: Breath Sounds:-Normal.  Cardiovascular Auscultation:Rythm- Regular. Murmurs & Other Heart Sounds:Auscultation of the heart reveals- No Murmurs.  Abdomen Inspection:-Inspeection Normal. Palpation/Percussion:Note:No mass. Palpation and Percussion of the abdomen reveal- Non Tender, Non Distended + BS, no rebound or guarding.  Neurologic Cranial Nerve exam:- CN III-XII intact(No nystagmus), symmetric smile. Strength:- 5/5 equal and symmetric strength both upper and lower extremities.      Assessment & Plan:  For your anxiety and intermittent panic attacks, I want you to stop hydroxyzine and will prescribe BuSpar.  Hopefully daily use of BuSpar will decrease anxiety and prevent you from having any panic attacks.  I am glad to hear that your depressed mood has not returned.  So presently you can put off contacting behavioral health.  However if your depressed mood returns then would still advise you getting in with behavioral health for the reason stated previously.  For skin infection/folliculitis, I am prescribing doxycyclin  antibiotic.  Rx advised and given.  Follow-up in 2 weeks or as needed.  25 minutes spent with pt. 50% of time spent counseling pt on plan going forward.  Caleen Jobs initially evaluated pt and documented. I interviewed and made appropiate changes to note. Tx decision made by myself.  Mackie Pai, PA-C

## 2019-04-02 NOTE — Patient Instructions (Signed)
For your anxiety and intermittent panic attacks, I want you to stop hydroxyzine and will prescribe BuSpar.  Hopefully daily use of BuSpar will decrease anxiety and prevent you from having any panic attacks.  I am glad to hear that your depressed mood has not returned.  So presently you can put off contacting behavioral health.  However if your depressed mood returns then would still advise you getting in with behavioral health for the reason stated previously.  For skin infection/folliculitis, I am prescribing doxycyclin antibiotic.  Rx advised and given.  Follow-up in 2 weeks or as needed.

## 2019-04-16 ENCOUNTER — Ambulatory Visit: Payer: No Typology Code available for payment source | Admitting: Medical

## 2019-04-23 ENCOUNTER — Ambulatory Visit: Payer: No Typology Code available for payment source | Admitting: Medical

## 2019-07-09 ENCOUNTER — Ambulatory Visit: Payer: No Typology Code available for payment source | Attending: Internal Medicine

## 2019-07-09 DIAGNOSIS — Z20822 Contact with and (suspected) exposure to covid-19: Secondary | ICD-10-CM

## 2019-07-11 ENCOUNTER — Ambulatory Visit (INDEPENDENT_AMBULATORY_CARE_PROVIDER_SITE_OTHER): Payer: No Typology Code available for payment source | Admitting: Medical

## 2019-07-11 ENCOUNTER — Encounter: Payer: Self-pay | Admitting: Medical

## 2019-07-11 ENCOUNTER — Telehealth: Payer: Self-pay

## 2019-07-11 VITALS — HR 83 | Temp 98.0°F | Ht 74.0 in | Wt 260.0 lb

## 2019-07-11 DIAGNOSIS — R112 Nausea with vomiting, unspecified: Secondary | ICD-10-CM | POA: Diagnosis not present

## 2019-07-11 DIAGNOSIS — R6883 Chills (without fever): Secondary | ICD-10-CM

## 2019-07-11 DIAGNOSIS — U071 COVID-19: Secondary | ICD-10-CM | POA: Diagnosis not present

## 2019-07-11 DIAGNOSIS — R05 Cough: Secondary | ICD-10-CM | POA: Diagnosis not present

## 2019-07-11 DIAGNOSIS — R059 Cough, unspecified: Secondary | ICD-10-CM

## 2019-07-11 LAB — NOVEL CORONAVIRUS, NAA: SARS-CoV-2, NAA: DETECTED — AB

## 2019-07-11 MED ORDER — ALBUTEROL SULFATE HFA 108 (90 BASE) MCG/ACT IN AERS: 2.0000 | INHALATION_SPRAY | Freq: Four times a day (QID) | RESPIRATORY_TRACT | 0 refills | Status: DC | PRN

## 2019-07-11 MED ORDER — ONDANSETRON 8 MG PO TBDP: 8.0000 mg | ORAL_TABLET | Freq: Three times a day (TID) | ORAL | 0 refills | Status: DC | PRN

## 2019-07-11 MED ORDER — BENZONATATE 100 MG PO CAPS: 100.0000 mg | ORAL_CAPSULE | Freq: Three times a day (TID) | ORAL | 0 refills | Status: DC | PRN

## 2019-07-11 MED ORDER — AZITHROMYCIN 250 MG PO TABS: ORAL_TABLET | ORAL | 0 refills | Status: DC

## 2019-07-11 MED FILL — ALBUTEROL SULFATE HFA 108 (: 108 (90 BAS | 25 days supply | Qty: 18 | Fill #0

## 2019-07-11 MED FILL — AZITHROMYCIN 250 MG TABLET: 250 | 5 days supply | Qty: 6 | Fill #0

## 2019-07-11 MED FILL — BENZONATATE 100 MG CAPS: 100 | 10 days supply | Qty: 30 | Fill #0

## 2019-07-11 MED FILL — ONDANSETRON ODT 8 MG TABLET: 8 | 6 days supply | Qty: 20 | Fill #0

## 2019-07-11 NOTE — Progress Notes (Signed)
Subjective:    Patient ID: Tracy Rich, male    DOB: 11/16/1995, 24 y.o.   MRN: [id number]  HPI  Virtual Visit via Telephone Note  I connected with [name] on 07/11/19 at  2:00 PM EST by telephone and verified that I am speaking with the correct person using two identifiers.  Location: Patient: home Provider: home   I discussed the limitations, risks, security and privacy concerns of performing an evaluation and management service by telephone and the availability of in person appointments. I also discussed with the patient that there may be a patient responsible charge related to this service. The patient expressed understanding and agreed to proceed.   History of Present Illness:   Pt got + positive covid results today. Pt states his symptoms started around Jul 03, 2018. Pt states at first had nasal congestion with loss of taste and smell. Pt states 1st 2 days had cough. Pt states did not have productive cough. Pt wheezes some when he lays down. Pt not having any diarrhea. But he states upset stomach and vomiting. He states can keep anything down for past 2 days.   Presently his 02 sat 96%. But mentions mild sob when he walks. No hx of asthma.  Pt has not had any fever.     Observations/Objective:  General- no acute distress, pleasant patient, alert and oriented. Normal speech.      Assessment and Plan: For covid infection with possible bronchitis and sinus infection rx  Azithromycin antibiotic and benzonatate cough med.  Albuterol for any shortness of breath or wheezing.    Keep checking o2 sats. Advise on worrisome numbers as discussed/educated then be seen in ED if needed.  Stay hydrated. [employer] or sugar free gatorade. Take zofran for nausea and vomiting.  Follow up 7 days or prn.  Pt will be off work. Can't go back to work unless he tests negative.  Follow Up Instructions:    I discussed the assessment and treatment plan with the patient.  The patient was provided an opportunity to ask questions and all were answered. The patient agreed with the plan and demonstrated an understanding of the instructions.   The patient was advised to call back or seek an in-person evaluation if the symptoms worsen or if the condition fails to improve as anticipated.  I provided 20 minutes of non-face-to-face time during this encounter.   [name], PA-C   Review of Systems  Constitutional: Positive for chills. Negative for fatigue and fever.  HENT: Positive for sinus pressure and sinus pain. Negative for ear pain.   Respiratory: Positive for shortness of breath. Negative for cough and wheezing.        Feels chest congested.  Cardiovascular: Negative for chest pain and palpitations.  Gastrointestinal: Positive for nausea and vomiting. Negative for abdominal distention, abdominal pain, constipation and diarrhea.  Musculoskeletal: Positive for myalgias.    Past Medical History:  Diagnosis Date  . Reflux      Social History   Socioeconomic History  . Marital status: Single    Spouse name: Not on file  . Number of children: Not on file  . Years of education: Not on file  . Highest education level: Not on file  Occupational History  . Not on file  Tobacco Use  . Smoking status: Former Games developer  . Smokeless tobacco: Never Used  Substance and Sexual Activity  . Alcohol use: No  . Drug use: No  . Sexual activity:  Not on file  Other Topics Concern  . Not on file  Social History Narrative  . Not on file   Social Determinants of Health   Financial Resource Strain:   . Difficulty of Paying Living Expenses: Not on file  Food Insecurity:   . Worried About Charity fundraiser in the Last Year: Not on file  . Ran Out of Food in the Last Year: Not on file  Transportation Needs:   . Lack of Transportation (Medical): Not on file  . Lack of Transportation (Non-Medical): Not on file  Physical Activity:   . Days of Exercise per  Week: Not on file  . Minutes of Exercise per Session: Not on file  Stress:   . Feeling of Stress : Not on file  Social Connections:   . Frequency of Communication with Friends and Family: Not on file  . Frequency of Social Gatherings with Friends and Family: Not on file  . Attends Religious Services: Not on file  . Active Member of Clubs or Organizations: Not on file  . Attends Archivist Meetings: Not on file  . Marital Status: Not on file  Intimate Partner Violence:   . Fear of Current or Ex-Partner: Not on file  . Emotionally Abused: Not on file  . Physically Abused: Not on file  . Sexually Abused: Not on file    Past Surgical History:  Procedure Laterality Date  . TONSILLECTOMY    . WISDOM TOOTH EXTRACTION      No family history on file.  No Known Allergies  Current Outpatient Medications on File Prior to Visit  Medication Sig Dispense Refill  . busPIRone (BUSPAR) 7.5 MG tablet Take 1 tablet (7.5 mg total) by mouth 2 (two) times daily. 60 tablet 0  . hydrOXYzine (ATARAX/VISTARIL) 10 MG tablet Take 1 tablet (10 mg total) by mouth 3 (three) times daily as needed for anxiety. 6 tablet 0   No current facility-administered medications on file prior to visit.    Pulse 83   Temp 98 F (36.7 C) (Oral)   Ht 6\' 2"  (1.88 m)   Wt 260 lb (117.9 kg)   SpO2 96%   BMI 33.38 kg/m       Objective:   Physical Exam        Assessment & Plan:

## 2019-07-11 NOTE — Telephone Encounter (Signed)
Patient is having coughing, vomiting, body aches, congestion and headache with slight SOB. Patient was able to check temp during phone call it was 98.0. Patient did start vomiting on the call. Patient has not been able to keep anything down including water x2 days. He states he feels super weak and does not want to get out of bed.

## 2019-07-11 NOTE — Patient Instructions (Addendum)
For covid infection with possible bronchitis and sinus infection rx  Azithromycin antibiotic and benzonatate cough med.  Albuterol for any shortness of breath or wheezing.    Keep checking o2 sats. Advise on worrisome numbers as discussed/educated then be seen in ED if needed.  Stay hydrated. [employer] or sugar free gatorade. Take zofran for nausea and vomiting.  Follow up 7 days or prn.  Pt will be off work. Can't go back to work unless he tests negative.

## 2019-07-11 NOTE — Telephone Encounter (Signed)
Discuss on virtual visit.

## 2019-07-21 ENCOUNTER — Other Ambulatory Visit: Payer: Self-pay

## 2019-07-22 ENCOUNTER — Ambulatory Visit: Payer: No Typology Code available for payment source | Admitting: Medical

## 2019-07-22 DIAGNOSIS — Z0289 Encounter for other administrative examinations: Secondary | ICD-10-CM

## 2020-01-25 ENCOUNTER — Encounter: Payer: Self-pay | Admitting: Emergency Medicine

## 2020-01-25 ENCOUNTER — Emergency Department (HOSPITAL_COMMUNITY)
Admission: EM | Admit: 2020-01-25 | Discharge: 2020-01-25 | Disposition: A | Discharge status: Home / Self Care | Payer: No Typology Code available for payment source | Attending: Emergency Medicine | Admitting: Emergency Medicine

## 2020-01-25 ENCOUNTER — Emergency Department (HOSPITAL_COMMUNITY): Payer: No Typology Code available for payment source

## 2020-01-25 ENCOUNTER — Other Ambulatory Visit: Payer: Self-pay

## 2020-01-25 ENCOUNTER — Emergency Department
Admission: EM | Admit: 2020-01-25 | Discharge: 2020-01-25 | Disposition: A | Discharge status: Home / Self Care | Payer: No Typology Code available for payment source | Source: Home / Self Care

## 2020-01-25 ENCOUNTER — Encounter (HOSPITAL_COMMUNITY): Payer: Self-pay

## 2020-01-25 DIAGNOSIS — R1031 Right lower quadrant pain: Secondary | ICD-10-CM

## 2020-01-25 DIAGNOSIS — J029 Acute pharyngitis, unspecified: Secondary | ICD-10-CM | POA: Insufficient documentation

## 2020-01-25 DIAGNOSIS — R1011 Right upper quadrant pain: Secondary | ICD-10-CM

## 2020-01-25 DIAGNOSIS — Z87891 Personal history of nicotine dependence: Secondary | ICD-10-CM | POA: Diagnosis not present

## 2020-01-25 DIAGNOSIS — Z20822 Contact with and (suspected) exposure to covid-19: Secondary | ICD-10-CM | POA: Insufficient documentation

## 2020-01-25 DIAGNOSIS — R112 Nausea with vomiting, unspecified: Secondary | ICD-10-CM | POA: Diagnosis not present

## 2020-01-25 DIAGNOSIS — K5900 Constipation, unspecified: Secondary | ICD-10-CM

## 2020-01-25 DIAGNOSIS — R509 Fever, unspecified: Secondary | ICD-10-CM | POA: Insufficient documentation

## 2020-01-25 DIAGNOSIS — J069 Acute upper respiratory infection, unspecified: Secondary | ICD-10-CM

## 2020-01-25 LAB — CBC
HCT: 46.6 % (ref 39.0–52.0)
Hemoglobin: 15.2 g/dL (ref 13.0–17.0)
MCH: 30.1 pg (ref 26.0–34.0)
MCHC: 32.6 g/dL (ref 30.0–36.0)
MCV: 92.3 fL (ref 80.0–100.0)
Platelets: 209 10*3/uL (ref 150–400)
RBC: 5.05 MIL/uL (ref 4.22–5.81)
RDW: 11.9 % (ref 11.5–15.5)
WBC: 12.3 10*3/uL — ABNORMAL HIGH (ref 4.0–10.5)
nRBC: 0 % (ref 0.0–0.2)

## 2020-01-25 LAB — COMPREHENSIVE METABOLIC PANEL
ALT: 25 U/L (ref 0–44)
AST: 21 U/L (ref 15–41)
Albumin: 4.6 g/dL (ref 3.5–5.0)
Alkaline Phosphatase: 71 U/L (ref 38–126)
Anion gap: 11 (ref 5–15)
BUN: 8 mg/dL (ref 6–20)
CO2: 25 mmol/L (ref 22–32)
Calcium: 9.5 mg/dL (ref 8.9–10.3)
Chloride: 104 mmol/L (ref 98–111)
Creatinine, Ser: 1.24 mg/dL (ref 0.61–1.24)
GFR calc Af Amer: >60 mL/min (ref >60)
GFR calc non Af Amer: >60 mL/min (ref >60)
Glucose, Bld: 107 mg/dL — ABNORMAL HIGH (ref 70–99)
Potassium: 3.7 mmol/L (ref 3.5–5.1)
Sodium: 140 mmol/L (ref 135–145)
Total Bilirubin: 2.3 mg/dL — ABNORMAL HIGH (ref 0.3–1.2)
Total Protein: 7.6 g/dL (ref 6.5–8.1)

## 2020-01-25 LAB — URINALYSIS, ROUTINE W REFLEX MICROSCOPIC
Bilirubin Urine: NEGATIVE
Glucose, UA: NEGATIVE mg/dL
Hgb urine dipstick: NEGATIVE
Ketones, ur: NEGATIVE mg/dL
Leukocytes,Ua: NEGATIVE
Nitrite: NEGATIVE
Protein, ur: NEGATIVE mg/dL
Specific Gravity, Urine: 1.014 (ref 1.005–1.030)
pH: 6 (ref 5.0–8.0)

## 2020-01-25 LAB — POC SARS CORONAVIRUS 2 AG -  ED: SARS Coronavirus 2 Ag: NEGATIVE

## 2020-01-25 LAB — LACTIC ACID, PLASMA: Lactic Acid, Venous: 1.5 mmol/L (ref 0.5–1.9)

## 2020-01-25 LAB — POCT RAPID STREP A (OFFICE): Rapid Strep A Screen: NEGATIVE

## 2020-01-25 LAB — SARS CORONAVIRUS 2 BY RT PCR (HOSPITAL ORDER, PERFORMED IN ~~LOC~~ HOSPITAL LAB): SARS Coronavirus 2: NEGATIVE

## 2020-01-25 LAB — LIPASE, BLOOD: Lipase: 35 U/L (ref 11–51)

## 2020-01-25 MED ORDER — SODIUM CHLORIDE 0.9 % IV BOLUS
1000.0000 mL | Freq: Once | INTRAVENOUS | Status: AC
  Administered 2020-01-25: 1000 mL via INTRAVENOUS

## 2020-01-25 MED ORDER — DICYCLOMINE HCL 10 MG PO CAPS
10.0000 mg | ORAL_CAPSULE | Freq: Once | ORAL | Status: AC
  Administered 2020-01-25: 10 mg via ORAL
  Filled 2020-01-25: qty 1

## 2020-01-25 MED ORDER — ACETAMINOPHEN 325 MG PO TABS
650.0000 mg | ORAL_TABLET | Freq: Once | ORAL | Status: AC
  Administered 2020-01-25: 650 mg via ORAL

## 2020-01-25 MED ORDER — METRONIDAZOLE IN NACL 5-0.79 MG/ML-% IV SOLN
500.0000 mg | Freq: Once | INTRAVENOUS | Status: AC
  Administered 2020-01-25: 500 mg via INTRAVENOUS
  Filled 2020-01-25: qty 100

## 2020-01-25 MED ORDER — ACETAMINOPHEN 500 MG PO TABS
1000.0000 mg | ORAL_TABLET | Freq: Once | ORAL | Status: AC
  Administered 2020-01-25: 1000 mg via ORAL
  Filled 2020-01-25: qty 2

## 2020-01-25 MED ORDER — HYDROMORPHONE HCL 1 MG/ML IJ SOLN
0.5000 mg | Freq: Once | INTRAMUSCULAR | Status: AC
  Administered 2020-01-25: 0.5 mg via INTRAVENOUS
  Filled 2020-01-25: qty 1

## 2020-01-25 MED ORDER — ONDANSETRON HCL 4 MG/2ML IJ SOLN
4.0000 mg | Freq: Once | INTRAMUSCULAR | Status: AC
  Administered 2020-01-25: 4 mg via INTRAVENOUS
  Filled 2020-01-25: qty 2

## 2020-01-25 MED ORDER — IOHEXOL 300 MG/ML  SOLN
100.0000 mL | Freq: Once | INTRAMUSCULAR | Status: AC | PRN
  Administered 2020-01-25: 100 mL via INTRAVENOUS

## 2020-01-25 MED ORDER — SODIUM CHLORIDE 0.9 % IV SOLN
2.0000 g | Freq: Once | INTRAVENOUS | Status: AC
  Administered 2020-01-25: 2 g via INTRAVENOUS
  Filled 2020-01-25: qty 20

## 2020-01-25 MED ORDER — SODIUM CHLORIDE 0.9% FLUSH
3.0000 mL | Freq: Once | INTRAVENOUS | Status: AC
  Administered 2020-01-25: 3 mL via INTRAVENOUS

## 2020-01-25 NOTE — ED Provider Notes (Signed)
Care assumed from [facility], [place], at shift change, please see their notes for full documentation of patient's complaint/HPI. Briefly, pt here with 1 week of right sied abdominal pain, fevers, sore throat, and body aches. Sent from UC with concern for possible appendicitis. Rapid COVID and rapid strep test at UC negative. Results so far show mild leukocytosis 12.3. Negative CT scan for appendicitis. Awaiting recheck after additional fluid bolus. Pt did have COVID in January of this year however has been retested. Plan is to dispo accordingly.   Physical Exam  BP (!) 133/77 (BP Location: Right Arm)   Pulse (!) 118   Temp (!) 103 F (39.4 C) (Oral)   Resp (!) 25   Ht 6' (1.829 m)   Wt (!) 122.5 kg   SpO2 98%   BMI 36.62 kg/m   Physical Exam Vitals and nursing note reviewed.  Constitutional:      Appearance: He is not ill-appearing.  HENT:     Head: Normocephalic and atraumatic.     Mouth/Throat:     Mouth: Mucous membranes are moist.     Comments: + Posterior oropharyngeal erythema. No tonsillary hypertrophy. No exudate.  Eyes:     Conjunctiva/sclera: Conjunctivae normal.  Cardiovascular:     Rate and Rhythm: Regular rhythm. Tachycardia present.  Pulmonary:     Effort: Pulmonary effort is normal.     Breath sounds: Normal breath sounds. No wheezing, rhonchi or rales.  Abdominal:     Tenderness: There is abdominal tenderness in the right upper quadrant and right lower quadrant. There is no guarding or rebound.  Skin:    General: Skin is warm and dry.     Coloration: Skin is not jaundiced.  Neurological:     Mental Status: He is alert.     ED Course/Procedures   Clinical Course as of Jan 25 1723  Sun Jan 25, 2020  1603 SARS Coronavirus 2: NEGATIVE [MV]    Clinical Course User Index [MV] Tanda Rockers, PA-C    Procedures  Results for orders placed or performed during the hospital encounter of 01/25/20  SARS Coronavirus 2 by RT PCR (hospital order, performed in [facility] hospital lab) Nasopharyngeal Nasopharyngeal Swab   Specimen: Nasopharyngeal Swab  Result Value Ref Range   SARS Coronavirus 2 NEGATIVE NEGATIVE  Lipase, blood  Result Value Ref Range   Lipase 35 11 - 51 U/L  Comprehensive metabolic panel  Result Value Ref Range   Sodium 140 135 - 145 mmol/L   Potassium 3.7 3.5 - 5.1 mmol/L   Chloride 104 98 - 111 mmol/L   CO2 25 22 - 32 mmol/L   Glucose, Bld 107 (H) 70 - 99 mg/dL   BUN 8 6 - 20 mg/dL   Creatinine, Ser 8.29 0.61 - 1.24 mg/dL   Calcium 9.5 8.9 - 93.7 mg/dL   Total Protein 7.6 6.5 - 8.1 g/dL   Albumin 4.6 3.5 - 5.0 g/dL   AST 21 15 - 41 U/L   ALT 25 0 - 44 U/L   Alkaline Phosphatase 71 38 - 126 U/L   Total Bilirubin 2.3 (H) 0.3 - 1.2 mg/dL   GFR calc non Af Amer >60 >60 mL/min   GFR calc Af Amer >60 >60 mL/min   Anion gap 11 5 - 15  CBC  Result Value Ref Range   WBC 12.3 (H) 4.0 - 10.5 K/uL   RBC 5.05 4.22 - 5.81 MIL/uL   Hemoglobin 15.2 13.0 - 17.0 g/dL  HCT 46.6 39 - 52 %   MCV 92.3 80.0 - 100.0 fL   MCH 30.1 26.0 - 34.0 pg   MCHC 32.6 30.0 - 36.0 g/dL   RDW 16.1 09.6 - 04.5 %   Platelets 209 150 - 400 K/uL   nRBC 0.0 0.0 - 0.2 %  Urinalysis, Routine w reflex microscopic  Result Value Ref Range   Color, Urine YELLOW YELLOW   APPearance CLEAR CLEAR   Specific Gravity, Urine 1.014 1.005 - 1.030   pH 6.0 5.0 - 8.0   Glucose, UA NEGATIVE NEGATIVE mg/dL   Hgb urine dipstick NEGATIVE NEGATIVE   Bilirubin Urine NEGATIVE NEGATIVE   Ketones, ur NEGATIVE NEGATIVE mg/dL   Protein, ur NEGATIVE NEGATIVE mg/dL   Nitrite NEGATIVE NEGATIVE   Leukocytes,Ua NEGATIVE NEGATIVE  Lactic acid, plasma  Result Value Ref Range   Lactic Acid, Venous 1.5 0.5 - 1.9 mmol/L   CT ABDOMEN PELVIS W CONTRAST  Result Date: 01/25/2020 CLINICAL DATA:  Right lower quadrant abdominal pain EXAM: CT ABDOMEN AND PELVIS WITH CONTRAST TECHNIQUE: Multidetector CT imaging of the abdomen and pelvis was performed using the standard protocol following  bolus administration of intravenous contrast. CONTRAST:  OMNIPAQUE IOHEXOL 300 MG/ML  SOLN COMPARISON:  None. FINDINGS: Lower chest: No acute abnormality. Hepatobiliary: No solid liver abnormality is seen. No gallstones, gallbladder wall thickening, or biliary dilatation. Pancreas: Unremarkable. No pancreatic ductal dilatation or surrounding inflammatory changes. Spleen: Normal in size without significant abnormality. Adrenals/Urinary Tract: Adrenal glands are unremarkable. Kidneys are normal, without renal calculi, solid lesion, or hydronephrosis. Bladder is unremarkable. Stomach/Bowel: Stomach is within normal limits. Appendix appears normal. No evidence of bowel wall thickening, distention, or inflammatory changes. Vascular/Lymphatic: No significant vascular findings are present. No enlarged abdominal or pelvic lymph nodes. Reproductive: No mass or other significant abnormality. Other: No abdominal wall hernia or abnormality. No abdominopelvic ascites. Musculoskeletal: No acute or significant osseous findings. IMPRESSION: No CT findings of the abdomen or pelvis to explain right lower quadrant abdominal pain. Normal appendix. Electronically Signed   By: Lauralyn Primes M.D.   On: 01/25/2020 14:28    MDM  PCR COVID test negative. Repeat vitals after additional fluid bolus with temp decreased to 100.1 and HR to 97. Pt reports improvement in his symptoms. Suspect his sx are viral in nature. Will discharge home at this time. Do not think he needs abx. Have instructed on Tylenol PRN for fever and increased water intake to stay hydrated. Pt to follow up with PCP. He is in agreement with plan and stable for discharge home.   This note was prepared using Dragon voice recognition software and may include unintentional dictation errors due to the inherent limitations of voice recognition software.        Tanda Rockers, PA-C 01/25/20 1729    Virgina Norfolk, DO 01/25/20 1752

## 2020-01-25 NOTE — ED Notes (Signed)
Patient is being discharged from the Urgent Care and sent to the Emergency Department via POV driven by Dad. Per Waylan Rocher PAC, patient is in need of higher level of care due to r/o appendicitis. Patient is aware and verbalizes understanding of plan of care.  Vitals:   01/25/20 0929 01/25/20 0934  BP:  (!) 148/86  Pulse: (!) 122   Resp: 18   Temp: (!) 102.7 F (39.3 C)   SpO2: 100%

## 2020-01-25 NOTE — ED Provider Notes (Signed)
[facility] EMERGENCY DEPARTMENT Provider Note   CSN: [id number] Arrival date & time: 01/25/20  1127     History Chief Complaint  Patient presents with  . Abdominal Pain    Tracy Rich is a 24 y.o. male.  Patient with no past abdominal surgical history presents from [facility] with a 1 week history of abdominal pain. He reports feeling pain in his lower right abdomen and directly above his umbilicus one week ago. Over the next few days, the pain migrated to his mid right abdomen and became more severe. He describes it as very uncomfortable and worse when he moves. Last night, he began feeling feverish and complaining of body aches, chills, and dry heaves. He has not been able to eat or drink anything since last night due to his pain. He presented to Urgent Care this morning for the previous complaints plus a sore throat. He was tested for Covid and strep pharyngitis, both tests were negative, and was instructed to come to this facility for evaluation of appendicitis. He denies vomiting, diarrhea, cough, headache, or sick contacts. No hematuria or irritative UTI symptoms including dysuria, increased frequency or urgency. The only medication he takes is occasional ranitidine for acid reflux, no known allergies.          Past Medical History:  Diagnosis Date  . Reflux     There are no problems to display for this patient.   Past Surgical History:  Procedure Laterality Date  . TONSILLECTOMY    . WISDOM TOOTH EXTRACTION         Family History  Problem Relation Age of Onset  . Thyroid disease Mother   . Cancer Mother   . Hypertension Mother   . Healthy Father   . Healthy Sister     Social History   Tobacco Use  . Smoking status: Former Games developer  . Smokeless tobacco: Never Used  Vaping Use  . Vaping Use: Never used  Substance Use Topics  . Alcohol use: No  . Drug use: No    Home Medications Prior to Admission medications   Medication  Sig Start Date End Date Taking? Authorizing Provider  albuterol (VENTOLIN HFA) 108 (90 Base) MCG/ACT inhaler Inhale 2 puffs into the lungs every 6 (six) hours as needed. 07/11/19   Saguier, Ramon Dredge, PA-C  azithromycin (ZITHROMAX) 250 MG tablet Take 2 tablets by mouth on day 1, followed by 1 tablet by mouth daily for 4 days. 07/11/19   Saguier, Ramon Dredge, PA-C  benzonatate (TESSALON) 100 MG capsule Take 1 capsule (100 mg total) by mouth 3 (three) times daily as needed for cough. 07/11/19   Saguier, Ramon Dredge, PA-C  busPIRone (BUSPAR) 7.5 MG tablet Take 1 tablet (7.5 mg total) by mouth 2 (two) times daily. 04/02/19   Saguier, Ramon Dredge, PA-C  hydrOXYzine (ATARAX/VISTARIL) 10 MG tablet Take 1 tablet (10 mg total) by mouth 3 (three) times daily as needed for anxiety. 03/17/19   Saguier, Ramon Dredge, PA-C  ondansetron (ZOFRAN ODT) 8 MG disintegrating tablet Take 1 tablet (8 mg total) by mouth every 8 (eight) hours as needed for nausea or vomiting. 07/11/19   Saguier, Ramon Dredge, PA-C    Allergies    Patient has no known allergies.  Review of Systems   Review of Systems  Constitutional: Positive for appetite change, chills and fever.  HENT: Positive for sore throat. Negative for rhinorrhea.   Eyes: Negative for redness.  Respiratory: Negative for cough.   Cardiovascular: Negative for chest pain.  Gastrointestinal: Positive for abdominal pain and nausea. Negative for constipation, diarrhea and vomiting.  Genitourinary: Negative for dysuria, frequency and hematuria.  Musculoskeletal: Positive for myalgias.  Skin: Negative for rash.  Neurological: Negative for headaches.    Physical Exam Updated Vital Signs BP (!) 138/113 (BP Location: Left Arm)   Pulse (!) 127   Temp (!) 101.3 F (38.5 C) (Oral)   Resp 20   Ht 6' (1.829 m)   Wt (!) 122.5 kg   SpO2 99%   BMI 36.62 kg/m   Physical Exam Vitals and nursing note reviewed.  Constitutional:      Appearance: He is well-developed.  HENT:     Head: Normocephalic and  atraumatic.     Mouth/Throat:     Mouth: Mucous membranes are moist.     Pharynx: No oropharyngeal exudate.     Comments: Mild oropharyngeal erythema.  No swelling, peritonsillar abscess or exudates. Eyes:     General:        Right eye: No discharge.        Left eye: No discharge.     Conjunctiva/sclera: Conjunctivae normal.  Cardiovascular:     Rate and Rhythm: Regular rhythm. Tachycardia present.     Heart sounds: Normal heart sounds.  Pulmonary:     Effort: Pulmonary effort is normal.     Breath sounds: Normal breath sounds.  Abdominal:     Palpations: Abdomen is soft.     Tenderness: There is abdominal tenderness in the right lower quadrant, periumbilical area and suprapubic area. There is no guarding or rebound. Positive signs include McBurney's sign. Negative signs include Murphy's sign.  Musculoskeletal:     Cervical back: Normal range of motion and neck supple.  Skin:    General: Skin is warm and dry.  Neurological:     Mental Status: He is alert.     ED Results / Procedures / Treatments   Labs (all labs ordered are listed, but only abnormal results are displayed) Labs Reviewed  COMPREHENSIVE METABOLIC PANEL - Abnormal; Notable for the following components:      Result Value   Glucose, Bld 107 (*)    Total Bilirubin 2.3 (*)    All other components within normal limits  CBC - Abnormal; Notable for the following components:   WBC 12.3 (*)    All other components within normal limits  CULTURE, BLOOD (ROUTINE X 2)  CULTURE, BLOOD (ROUTINE X 2)  SARS CORONAVIRUS 2 BY RT PCR (HOSPITAL ORDER, PERFORMED IN [facility])  LIPASE, BLOOD  URINALYSIS, ROUTINE W REFLEX MICROSCOPIC  LACTIC ACID, PLASMA    EKG None  Radiology CT ABDOMEN PELVIS W CONTRAST  Result Date: 01/25/2020 CLINICAL DATA:  Right lower quadrant abdominal pain EXAM: CT ABDOMEN AND PELVIS WITH CONTRAST TECHNIQUE: Multidetector CT imaging of the abdomen and pelvis was performed using the  standard protocol following bolus administration of intravenous contrast. CONTRAST:  OMNIPAQUE IOHEXOL 300 MG/ML  SOLN COMPARISON:  None. FINDINGS: Lower chest: No acute abnormality. Hepatobiliary: No solid liver abnormality is seen. No gallstones, gallbladder wall thickening, or biliary dilatation. Pancreas: Unremarkable. No pancreatic ductal dilatation or surrounding inflammatory changes. Spleen: Normal in size without significant abnormality. Adrenals/Urinary Tract: Adrenal glands are unremarkable. Kidneys are normal, without renal calculi, solid lesion, or hydronephrosis. Bladder is unremarkable. Stomach/Bowel: Stomach is within normal limits. Appendix appears normal. No evidence of bowel wall thickening, distention, or inflammatory changes. Vascular/Lymphatic: No significant vascular findings are present. No enlarged abdominal or pelvic lymph  nodes. Reproductive: No mass or other significant abnormality. Other: No abdominal wall hernia or abnormality. No abdominopelvic ascites. Musculoskeletal: No acute or significant osseous findings. IMPRESSION: No CT findings of the abdomen or pelvis to explain right lower quadrant abdominal pain. Normal appendix. Electronically Signed   By: Lauralyn PrimesAlex  Bibbey M.D.   On: 01/25/2020 14:28    Procedures Procedures (including critical care time)  Medications Ordered in ED Medications  sodium chloride flush (NS) 0.9 % injection 3 mL (has no administration in time range)  sodium chloride 0.9 % bolus 1,000 mL (1,000 mLs Intravenous New Bag/Given 01/25/20 1258)  HYDROmorphone (DILAUDID) injection 0.5 mg (0.5 mg Intravenous Given 01/25/20 1258)  ondansetron (ZOFRAN) injection 4 mg (4 mg Intravenous Given 01/25/20 1257)  cefTRIAXone (ROCEPHIN) 2 g in sodium chloride 0.9 % 100 mL IVPB (0 g Intravenous Stopped 01/25/20 1429)    And  metroNIDAZOLE (FLAGYL) IVPB 500 mg (500 mg Intravenous New Bag/Given 01/25/20 1430)  iohexol (OMNIPAQUE) 300 MG/ML solution 100 mL (100 mLs  Intravenous Contrast Given 01/25/20 1405)  acetaminophen (TYLENOL) tablet 1,000 mg (1,000 mg Oral Given 01/25/20 1441)  sodium chloride 0.9 % bolus 1,000 mL (0 mLs Intravenous Stopped 01/25/20 1515)    ED Course  I have reviewed the triage vital signs and the nursing notes.  Pertinent labs & imaging results that were available during my care of the patient were reviewed by me and considered in my medical decision making (see chart for details).  Patient seen and examined.  Patient with right lower quadrant abdominal pain with fever, elevated white blood cell count.  Will obtain CT imaging and start antibiotics.  Vital signs reviewed and are as follows: BP (!) 138/113 (BP Location: Left Arm)   Pulse (!) 127   Temp (!) 101.3 F (38.5 C) (Oral)   Resp 20   Ht 6' (1.829 m)   Wt (!) 122.5 kg   SpO2 99%   BMI 36.62 kg/m   CT personally reviewed.  Results reviewed.  Fortunately, CT was negative for appendicitis or other obvious cause of the patient's abdominal pain and fever.  Patient updated.  Father at bedside.  Also updated mother by telephone.  With negative CT imaging, work-up is reassuring.  PCR Covid testing added.  Will give additional fluids and Tylenol.  Plan for discharged home with symptomatic treatment when temperature and pulse rate improved.   Patient counseled on supportive care for viral URI and s/s to return including worsening symptoms, persistent fever, persistent vomiting, or if they have any other concerns. Urged to see PCP if symptoms persist for more than 3 days. Patient verbalizes understanding and agrees with plan  BP (!) 133/77 (BP Location: Right Arm)   Pulse (!) 118   Temp (!) 103 F (39.4 C) (Oral)   Resp (!) 25   Ht 6' (1.829 m)   Wt (!) 122.5 kg   SpO2 98%   BMI 36.62 kg/m   Signout to [employer] PA-C at shift change.  Will reassess patient after Tylenol and IV fluids.  Plan for discharge to home.      MDM Rules/Calculators/A&P                           Patient with febrile illness, abdominal pain and sore throat.  Negative strep test at outside urgent care.  Negative rapid Covid test there.  There was concern for abdominal infection given tenderness on exam.  Fortunately CT did not show any  signs of appendicitis or other problems.  Unclear etiology, however patient appears nontoxic.  Continuing to work on symptom control with IV hydration and Tylenol.  Patient is not vomiting.  Blood cultures pending.  Given high suspicion of appendicitis on arrival, antibiotics ordered.  Given negative findings, patient will not require additional antibiotics at this time.  Low concern for pneumonia given no cough, shortness of breath.  Close follow-up and strict return instructions discussed as above.    Final Clinical Impression(s) / ED Diagnoses Final diagnoses:  Febrile illness  Right lower quadrant abdominal pain    Rx / DC Orders ED Discharge Orders    None       Renne Crigler, PA-C 01/25/20 1545    Pricilla Loveless, MD 01/28/20 [phone]

## 2020-01-25 NOTE — Discharge Instructions (Signed)
  You have declined EMS transport but it is still advised you have your dad drive you directly to the hospital of further evaluation and treatment of suspected appendicitis, which is a medical emergency.  Do not eat or drink anything along the way.

## 2020-01-25 NOTE — Discharge Instructions (Addendum)
Please read and follow all provided instructions.  Your diagnoses today include:  1. Febrile illness   2. Right lower quadrant abdominal pain     Tests performed today include:  Blood counts and electrolytes - high white blood cell count  Blood tests to check liver and kidney function  Blood tests to check pancreas function  Urine test to look for infection  CT abdomen and pelvis - does not show appendicitis or other problems  Covid test   Blood cultures  Vital signs. See below for your results today.   Medications prescribed:  Please use over-the-counter NSAID medications (ibuprofen, naproxen) or Tylenol as directed on the packaging for pain.   Take any prescribed medications only as directed.  Home care instructions:   Follow any educational materials contained in this packet.  Follow-up instructions: Please follow-up with your primary care provider in the next 3 days for further evaluation of your symptoms.    Return instructions:  SEEK IMMEDIATE MEDICAL ATTENTION IF:  The pain does not go away or becomes severe   Repeated vomiting occurs (multiple episodes)   The pain becomes localized to portions of the abdomen. The right side could possibly be appendicitis. In an adult, the left lower portion of the abdomen could be colitis or diverticulitis.   Blood is being passed in stools or vomit (bright red or black tarry stools)   You develop chest pain, difficulty breathing, dizziness or fainting, or become confused, poorly responsive, or inconsolable (young children)  If you have any other emergent concerns regarding your health  Additional Information: Abdominal (belly) pain can be caused by many things. Your caregiver performed an examination and possibly ordered blood/urine tests and imaging (CT scan, x-rays, ultrasound). Many cases can be observed and treated at home after initial evaluation in the emergency department. Even though you are being discharged home,  abdominal pain can be unpredictable. Therefore, you need a repeated exam if your pain does not resolve, returns, or worsens. Most patients with abdominal pain don't have to be admitted to the hospital or have surgery, but serious problems like appendicitis and gallbladder attacks can start out as nonspecific pain. Many abdominal conditions cannot be diagnosed in one visit, so follow-up evaluations are very important.  Your vital signs today were: BP (!) 133/77 (BP Location: Right Arm)   Pulse (!) 118   Temp (!) 103 F (39.4 C) (Oral)   Resp (!) 25   Ht 6' (1.829 m)   Wt (!) 122.5 kg   SpO2 98%   BMI 36.62 kg/m  If your blood pressure (bp) was elevated above 135/85 this visit, please have this repeated by your doctor within one month. --------------

## 2020-01-25 NOTE — ED Triage Notes (Signed)
Pt arrives to ED w/ c/o RLQ abdominal pain that started 1 WEEK AGO. Pt temp 101.3 endorses nausea. Pt reports 8/10 pain when moving.

## 2020-01-25 NOTE — ED Triage Notes (Signed)
C/o fever , generalized body aches, sore thraot since yesterday afternoon Tmax at home was 101.8 -tylenol 1000mg  at 0500 Temp in triage 102.7 No COVID vaccine Dry heaves - no emesis

## 2020-01-25 NOTE — ED Provider Notes (Signed)
[name]    CSN: [id number] Arrival date & time: 01/25/20  0905      History   Chief Complaint Chief Complaint  Patient presents with  . Generalized Body Aches  . Fever    HPI Tracy Rich is a 24 y.o. male.   HPI  Tracy Rich is a 24 y.o. male presenting to UC with c/o fever, chills, generalized body aches, sore throat since yesterday afternoon. Associated generalized abdominal pain and constipation for about 1 week, nausea and dry heaving.  He had a temp of 101.8*F this morning, took Tylenol 1g at 0500 this morning.  Temp in triage 102.7*F. due to feeling bad, he has not had anything to eat or drink today. Last meal was a small amount of chicken noodle soup yesterday afternoon.  He did not receive the Covid-19 vaccine. No sick contacts or recent travel but would like to be tested for Covid.    Past Medical History:  Diagnosis Date  . Reflux     There are no problems to display for this patient.   Past Surgical History:  Procedure Laterality Date  . TONSILLECTOMY    . WISDOM TOOTH EXTRACTION         Home Medications    Prior to Admission medications   Medication Sig Start Date End Date Taking? Authorizing Provider  ondansetron (ZOFRAN ODT) 8 MG disintegrating tablet Take 1 tablet (8 mg total) by mouth every 8 (eight) hours as needed for nausea or vomiting. 07/11/19  Yes Saguier, Ramon Dredge, PA-C  albuterol (VENTOLIN HFA) 108 (90 Base) MCG/ACT inhaler Inhale 2 puffs into the lungs every 6 (six) hours as needed. 07/11/19   Saguier, Ramon Dredge, PA-C  azithromycin (ZITHROMAX) 250 MG tablet Take 2 tablets by mouth on day 1, followed by 1 tablet by mouth daily for 4 days. 07/11/19   Saguier, Ramon Dredge, PA-C  benzonatate (TESSALON) 100 MG capsule Take 1 capsule (100 mg total) by mouth 3 (three) times daily as needed for cough. 07/11/19   Saguier, Ramon Dredge, PA-C  busPIRone (BUSPAR) 7.5 MG tablet Take 1 tablet (7.5 mg total) by mouth 2 (two) times daily. 04/02/19   Saguier,  Ramon Dredge, PA-C  hydrOXYzine (ATARAX/VISTARIL) 10 MG tablet Take 1 tablet (10 mg total) by mouth 3 (three) times daily as needed for anxiety. 03/17/19   Saguier, Ramon Dredge, PA-C    Family History Family History  Problem Relation Age of Onset  . Thyroid disease Mother   . Cancer Mother   . Hypertension Mother   . Healthy Father   . Healthy Sister     Social History Social History   Tobacco Use  . Smoking status: Former Games developer  . Smokeless tobacco: Never Used  Vaping Use  . Vaping Use: Never used  Substance Use Topics  . Alcohol use: No  . Drug use: No     Allergies   Patient has no known allergies.   Review of Systems Review of Systems  Constitutional: Positive for chills, fatigue and fever.  HENT: Positive for sore throat. Negative for congestion, ear pain, trouble swallowing and voice change.   Respiratory: Negative for cough and shortness of breath.   Cardiovascular: Positive for chest pain (soreness ). Negative for palpitations.  Gastrointestinal: Positive for abdominal pain, nausea and vomiting (dry heaves). Negative for diarrhea.  Musculoskeletal: Positive for arthralgias, back pain and myalgias.  Skin: Negative for rash.  Neurological: Positive for headaches. Negative for dizziness and light-headedness.  All other systems reviewed and are negative.  Physical Exam Triage Vital Signs ED Triage Vitals  Enc Vitals Group     BP 01/25/20 0934 (!) 148/86     Pulse Rate 01/25/20 0929 (!) 122     Resp 01/25/20 0929 18     Temp 01/25/20 0929 (!) 102.7 F (39.3 C)     Temp Source 01/25/20 0929 Oral     SpO2 01/25/20 0929 100 %     Weight 01/25/20 0937 (!) 270 lb (122.5 kg)     Height 01/25/20 0937 6\' 2"  (1.88 m)     Head Circumference --      Peak Flow --      Pain Score 01/25/20 0936 4     Pain Loc --      Pain Edu? --      Excl. in GC? --    No data found.  Updated Vital Signs BP (!) 148/86 (BP Location: Right Arm)   Pulse (!) 122   Temp (!) 102.7 F  (39.3 C) (Oral)   Resp 18   Ht 6\' 2"  (1.88 m)   Wt (!) 270 lb (122.5 kg)   SpO2 100%   BMI 34.67 kg/m   Visual Acuity Right Eye Distance:   Left Eye Distance:   Bilateral Distance:    Right Eye Near:   Left Eye Near:    Bilateral Near:     Physical Exam Vitals and nursing note reviewed.  Constitutional:      General: He is not in acute distress.    Appearance: Normal appearance. He is well-developed. He is obese. He is not ill-appearing, toxic-appearing or diaphoretic.  HENT:     Head: Normocephalic and atraumatic.     Right Ear: Tympanic membrane and ear canal normal.     Left Ear: Tympanic membrane and ear canal normal.     Nose: Nose normal.     Right Sinus: No maxillary sinus tenderness or frontal sinus tenderness.     Left Sinus: No maxillary sinus tenderness or frontal sinus tenderness.     Mouth/Throat:     Lips: Pink.     Mouth: Mucous membranes are moist.     Pharynx: Oropharynx is clear. Uvula midline. Posterior oropharyngeal erythema present. No pharyngeal swelling, oropharyngeal exudate or uvula swelling.     Tonsils: No tonsillar exudate or tonsillar abscesses.  Cardiovascular:     Rate and Rhythm: Regular rhythm. Tachycardia present.  Pulmonary:     Effort: Pulmonary effort is normal. No respiratory distress.     Breath sounds: Normal breath sounds. No stridor. No wheezing, rhonchi or rales.  Abdominal:     Palpations: Abdomen is soft. There is no mass.     Tenderness: There is abdominal tenderness in the right lower quadrant. There is rebound. There is no right CVA tenderness, left CVA tenderness or guarding. Positive signs include McBurney's sign.     Hernia: No hernia is present.       Comments: Obese abdomen, soft.   Musculoskeletal:        General: Normal range of motion.     Cervical back: Normal range of motion.  Skin:    General: Skin is warm and dry.  Neurological:     Mental Status: He is alert and oriented to person, place, and time.    Psychiatric:        Behavior: Behavior normal.      UC Treatments / Results  Labs (all labs ordered are listed, but only abnormal results are displayed) Labs Reviewed  STREP A DNA PROBE  POCT RAPID STREP A (OFFICE)  POC SARS CORONAVIRUS 2 AG -  ED    EKG   Radiology No results found.  Procedures Procedures (including critical care time)  Medications Ordered in UC Medications  acetaminophen (TYLENOL) tablet 650 mg (650 mg Oral Given 01/25/20 0949)    Initial Impression / Assessment and Plan / UC Course  I have reviewed the triage vital signs and the nursing notes.  Pertinent labs & imaging results that were available during my care of the patient were reviewed by me and considered in my medical decision making (see chart for details).    Initial concern for Covid-19 and/or strep throat, however, abdominal exam concerning for appendicitis Recommend further evaluation in ED Pt declined EMS transport but is agreeable to have his father drive him to the hospital.  Pt discharged in stable condition. AVS provided  Final Clinical Impressions(s) / UC Diagnoses   Final diagnoses:  Acute pharyngitis, unspecified etiology  RLQ abdominal pain  RUQ abdominal pain  Nausea and vomiting in adult patient  Constipation, unspecified constipation type  Viral URI with cough  Fever in adult     Discharge Instructions      You have declined EMS transport but it is still advised you have your dad drive you directly to the hospital of further evaluation and treatment of suspected appendicitis, which is a medical emergency.  Do not eat or drink anything along the way.     ED Prescriptions    None     PDMP not reviewed this encounter.   Lurene Shadow, [place] 01/25/20 1054

## 2020-01-25 NOTE — ED Notes (Signed)
Rapid COVID was negative - no send out  -[employer], PA-C verbally updated - pt is going to [facility] or [facility]  for further evaluation to r/o appendicitis & will have a COVID done for surgery at the hospital

## 2020-01-26 LAB — STREP A DNA PROBE: Group A Strep Probe: NOT DETECTED

## 2020-01-28 ENCOUNTER — Ambulatory Visit (INDEPENDENT_AMBULATORY_CARE_PROVIDER_SITE_OTHER): Payer: No Typology Code available for payment source | Admitting: Medical

## 2020-01-28 ENCOUNTER — Other Ambulatory Visit: Payer: Self-pay

## 2020-01-28 VITALS — BP 130/90 | HR 73 | Ht 72.0 in | Wt 275.0 lb

## 2020-01-28 DIAGNOSIS — D72829 Elevated white blood cell count, unspecified: Secondary | ICD-10-CM

## 2020-01-28 DIAGNOSIS — R110 Nausea: Secondary | ICD-10-CM

## 2020-01-28 DIAGNOSIS — R1031 Right lower quadrant pain: Secondary | ICD-10-CM

## 2020-01-28 LAB — CBC WITH DIFFERENTIAL/PLATELET
Basophils Absolute: 0.1 10*3/uL (ref 0.0–0.1)
Basophils Relative: 1 % (ref 0.0–3.0)
Eosinophils Absolute: 0.4 10*3/uL (ref 0.0–0.7)
Eosinophils Relative: 7.9 % — ABNORMAL HIGH (ref 0.0–5.0)
HCT: 43.2 % (ref 39.0–52.0)
Hemoglobin: 14.3 g/dL (ref 13.0–17.0)
Lymphocytes Relative: 30.9 % (ref 12.0–46.0)
Lymphs Abs: 1.7 10*3/uL (ref 0.7–4.0)
MCHC: 33.1 g/dL (ref 30.0–36.0)
MCV: 93 fl (ref 78.0–100.0)
Monocytes Absolute: 0.5 10*3/uL (ref 0.1–1.0)
Monocytes Relative: 9.1 % (ref 3.0–12.0)
Neutro Abs: 2.8 10*3/uL (ref 1.4–7.7)
Neutrophils Relative %: 51.1 % (ref 43.0–77.0)
Platelets: 188 10*3/uL (ref 150.0–400.0)
RBC: 4.65 Mil/uL (ref 4.22–5.81)
RDW: 12.8 % (ref 11.5–15.5)
WBC: 5.5 10*3/uL (ref 4.0–10.5)

## 2020-01-28 LAB — COMPREHENSIVE METABOLIC PANEL
ALT: 14 U/L (ref 0–53)
AST: 14 U/L (ref 0–37)
Albumin: 4.4 g/dL (ref 3.5–5.2)
Alkaline Phosphatase: 61 U/L (ref 39–117)
BUN: 12 mg/dL (ref 6–23)
CO2: 31 mEq/L (ref 19–32)
Calcium: 9.4 mg/dL (ref 8.4–10.5)
Chloride: 105 mEq/L (ref 96–112)
Creatinine, Ser: 1.06 mg/dL (ref 0.40–1.50)
GFR: 86.6 mL/min (ref >60.00)
Glucose, Bld: 88 mg/dL (ref 70–99)
Potassium: 4.5 mEq/L (ref 3.5–5.1)
Sodium: 141 mEq/L (ref 135–145)
Total Bilirubin: 0.7 mg/dL (ref 0.2–1.2)
Total Protein: 6.8 g/dL (ref 6.0–8.3)

## 2020-01-28 LAB — LIPASE: Lipase: 41 U/L (ref 11.0–59.0)

## 2020-01-28 MED ORDER — ONDANSETRON 4 MG PO TBDP
4.0000 mg | ORAL_TABLET | Freq: Three times a day (TID) | ORAL | 0 refills | Status: DC | PRN
Start: 2020-01-28 — End: 2020-09-23

## 2020-01-28 MED FILL — ONDANSETRON ODT 4 MG TABLET: 4 | 6 days supply | Qty: 20 | Fill #0

## 2020-01-28 NOTE — Patient Instructions (Addendum)
Patient is overall doing well.  Stable vital signs.  Recently negative work-up except for minimal WBC elevation.  Exam only shows minimal faint right lower quadrant tenderness.  Note again the CT was negative.  At this point will repeat CBC, CMP and lipase.  Advised patient to eat bland healthy diet.  Counseled on signs and symptoms of appendicitis.  If the symptoms were to worsen then would need to repeat CT.  If this WBCs are worse then we will recheck the patient assess level of discomfort and might need to place that imaging study order.  Follow-up date to be determined after lab review.

## 2020-01-28 NOTE — Progress Notes (Signed)
Subjective:    Patient ID: Tracy Rich, male    DOB: Jan 04, 1996, 24 y.o.   MRN: [id number]  HPI  Patient is in today for a follow-up from the emergency department.   Assessment plan from ED Patient with febrile illness, abdominal pain and sore throat.  Negative strep test at outside urgent care.  Negative rapid Covid test there.  There was concern for abdominal infection given tenderness on exam.  Fortunately CT did not show any signs of appendicitis or other problems.  Unclear etiology, however patient appears nontoxic.  Continuing to work on symptom control with IV hydration and Tylenol.  Patient is not vomiting.  Blood cultures pending.  Given high suspicion of appendicitis on arrival, antibiotics ordered.  Given negative findings, patient will not require additional antibiotics at this time.  Low concern for pneumonia given no cough, shortness of breath.  Close follow-up and strict return instructions discussed as above.  Patient since leaving emergency department reports that he does feel better.  Last time he had the right lower quadrant pain was 2 days ago.  Recently denies any fever, chills or vomiting.  Does report mild nausea.  His appetite is improving.  States feeling better overall.  Patient CT of abdomen did not show appendicitis as stated above.  All of his labs were normal except for slight WBC increased of 12.3.  He notes that he had diffuse body aches for 2 days prior to ED evaluation.  He mention this to the emergency department but he states they seemed to concentrate on his right lower quadrant pain.  Patient is diffuse muscle aches.day after ED evaluation.  Patient has no groin pain, testicle pain, inguinal bulge or urinary symptoms.   Review of Systems  Constitutional: Negative for chills, fatigue and fever.  Respiratory: Negative for cough, chest tightness, shortness of breath and wheezing.   Cardiovascular: Negative for chest pain and palpitations.    Gastrointestinal: Positive for nausea. Negative for abdominal distention, anal bleeding, blood in stool, constipation, diarrhea and vomiting.  Musculoskeletal: Negative for back pain.  Skin: Negative for rash.  Neurological: Negative for dizziness and headaches.  Hematological: Negative for adenopathy. Does not bruise/bleed easily.  Psychiatric/Behavioral: Negative for behavioral problems and confusion. The patient is not nervous/anxious.     Past Medical History:  Diagnosis Date   Reflux      Social History   Socioeconomic History   Marital status: Single    Spouse name: Not on file   Number of children: Not on file   Years of education: Not on file   Highest education level: Not on file  Occupational History   Not on file  Tobacco Use   Smoking status: Former Smoker   Smokeless tobacco: Never Used  Building services engineer Use: Never used  Substance and Sexual Activity   Alcohol use: No   Drug use: No   Sexual activity: Not on file  Other Topics Concern   Not on file  Social History Narrative   Not on file   Social Determinants of Health   Financial Resource Strain:    Difficulty of Paying Living Expenses:   Food Insecurity:    Worried About Programme researcher, broadcasting/film/video in the Last Year:    Barista in the Last Year:   Transportation Needs:    Freight forwarder (Medical):    Lack of Transportation (Non-Medical):   Physical Activity:    Days of Exercise per Week:  Minutes of Exercise per Session:   Stress:    Feeling of Stress :   Social Connections:    Frequency of Communication with Friends and Family:    Frequency of Social Gatherings with Friends and Family:    Attends Religious Services:    Active Member of Clubs or Organizations:    Attends Engineer, structural:    Marital Status:   Intimate Partner Violence:    Fear of Current or Ex-Partner:    Emotionally Abused:    Physically Abused:    Sexually  Abused:     Past Surgical History:  Procedure Laterality Date   TONSILLECTOMY     WISDOM TOOTH EXTRACTION      Family History  Problem Relation Age of Onset   Thyroid disease Mother    Cancer Mother    Hypertension Mother    Healthy Father    Healthy Sister     No Known Allergies  Current Outpatient Medications on File Prior to Visit  Medication Sig Dispense Refill   ondansetron (ZOFRAN ODT) 8 MG disintegrating tablet Take 1 tablet (8 mg total) by mouth every 8 (eight) hours as needed for nausea or vomiting. 20 tablet 0   albuterol (VENTOLIN HFA) 108 (90 Base) MCG/ACT inhaler Inhale 2 puffs into the lungs every 6 (six) hours as needed. (Patient not taking: Reported on 01/28/2020) 18 g 0   benzonatate (TESSALON) 100 MG capsule Take 1 capsule (100 mg total) by mouth 3 (three) times daily as needed for cough. (Patient not taking: Reported on 01/25/2020) 30 capsule 0   busPIRone (BUSPAR) 7.5 MG tablet Take 1 tablet (7.5 mg total) by mouth 2 (two) times daily. (Patient not taking: Reported on 01/25/2020) 60 tablet 0   hydrOXYzine (ATARAX/VISTARIL) 10 MG tablet Take 1 tablet (10 mg total) by mouth 3 (three) times daily as needed for anxiety. (Patient not taking: Reported on 01/25/2020) 6 tablet 0   No current facility-administered medications on file prior to visit.    BP (!) 130/90 (BP Location: Left Arm, Patient Position: Sitting, Cuff Size: Large)    Pulse 73    Ht 6' (1.829 m)    Wt (!) 275 lb (124.7 kg)    SpO2 98%    BMI 37.30 kg/m       Objective:   Physical Exam  General- No acute distress. Pleasant patient. Neck- Full range of motion, no jvd Lungs- Clear, even and unlabored. Heart- regular rate and rhythm. Neurologic- CNII- XII grossly intact. Abdomen-soft, nondistended, positive bowel sounds, no rebound or guarding.  Only faint minimal right lower quadrant tenderness to palpation.  No heel jar pain. Back no CVA tenderness.      Assessment & Plan:  Patient  is overall doing well.  Stable vital signs.  Recently negative work-up except for minimal WBC elevation.  Exam only shows minimal faint right lower quadrant tenderness.  Note again the CT was negative.  At this point will repeat CBC, CMP and lipase.  Advised patient to eat bland healthy diet.  Counseled on signs and symptoms of appendicitis.  If the symptoms were to worsen then would need to repeat CT.  If this WBCs are worse then we will recheck the patient assess level of discomfort and might need to place that imaging study order.  Follow-up date to be determined after lab review.  Time spent with patient today was 30  minutes which consisted of chart revdiew, discussing diagnosis, work up treatment and documentation.

## 2020-01-30 LAB — CULTURE, BLOOD (ROUTINE X 2)
Culture: NO GROWTH
Culture: NO GROWTH
Special Requests: ADEQUATE

## 2020-03-04 ENCOUNTER — Other Ambulatory Visit: Payer: Self-pay

## 2020-03-04 ENCOUNTER — Telehealth: Payer: Self-pay | Admitting: Medical

## 2020-03-04 ENCOUNTER — Ambulatory Visit (INDEPENDENT_AMBULATORY_CARE_PROVIDER_SITE_OTHER): Payer: No Typology Code available for payment source | Admitting: Medical

## 2020-03-04 ENCOUNTER — Encounter: Payer: Self-pay | Admitting: Medical

## 2020-03-04 VITALS — BP 126/89 | HR 83 | Resp 18 | Ht 72.0 in | Wt 277.0 lb

## 2020-03-04 DIAGNOSIS — J3489 Other specified disorders of nose and nasal sinuses: Secondary | ICD-10-CM

## 2020-03-04 DIAGNOSIS — H814 Vertigo of central origin: Secondary | ICD-10-CM | POA: Diagnosis not present

## 2020-03-04 DIAGNOSIS — R42 Dizziness and giddiness: Secondary | ICD-10-CM | POA: Diagnosis not present

## 2020-03-04 DIAGNOSIS — R519 Headache, unspecified: Secondary | ICD-10-CM | POA: Diagnosis not present

## 2020-03-04 DIAGNOSIS — R55 Syncope and collapse: Secondary | ICD-10-CM | POA: Diagnosis not present

## 2020-03-04 MED ORDER — MECLIZINE HCL 12.5 MG PO TABS: 12.5000 mg | ORAL_TABLET | Freq: Three times a day (TID) | ORAL | 0 refills | Status: DC | PRN

## 2020-03-04 MED ORDER — CEPHALEXIN 500 MG PO CAPS
500.0000 mg | ORAL_CAPSULE | Freq: Two times a day (BID) | ORAL | 0 refills | Status: DC
Start: 2020-03-04 — End: 2020-05-05

## 2020-03-04 MED FILL — CEPHALEXIN 500 MG CAPSULE: 500 | 10 days supply | Qty: 20 | Fill #0

## 2020-03-04 MED FILL — MECLIZINE 12.5 MG CAPLET: 12.5 | 10 days supply | Qty: 30 | Fill #0

## 2020-03-04 NOTE — Progress Notes (Signed)
Subjective:    Patient ID: Tracy Rich, male    DOB: 1995-11-17, 24 y.o.   MRN: [id number]  HPI  Pt in states he feels mid dizzy today. But last 2 days he was feeling light headed and confused. He describes almost prescyncope. He never passed out. During these event he was clinching his jaws.  On Monday morning passed out for about 1 minute. Later when came to he felt like arms were shakey/twitching. No loss of bladder control. Pt states he has had ha next day. Mild ha  for 3 days but now resolved.. Level 2 pain today. He does feel fatigued. Nausea. Since Monday. When passed out seated up right in bed and landed in bed.  Pt does have hx of anxiety.    Review of Systems  Constitutional: Negative for chills, fatigue and fever.  HENT: Positive for sinus pressure and sinus pain. Negative for congestion, ear discharge and rhinorrhea.   Respiratory: Negative for cough, chest tightness and wheezing.   Cardiovascular: Negative for chest pain and palpitations.  Gastrointestinal: Negative for anal bleeding.  Musculoskeletal: Negative for back pain.  Skin: Negative for rash.  Neurological: Positive for dizziness. Negative for syncope, speech difficulty, weakness and light-headedness.       Ha early in week not now.  Hematological: Negative for adenopathy. Does not bruise/bleed easily.  Psychiatric/Behavioral: Negative for agitation, behavioral problems, dysphoric mood, sleep disturbance and suicidal ideas.       Early in week mild confusion but not presenlty.   Past Medical History:  Diagnosis Date  . Reflux      Social History   Socioeconomic History  . Marital status: Single    Spouse name: Not on file  . Number of children: Not on file  . Years of education: Not on file  . Highest education level: Not on file  Occupational History  . Not on file  Tobacco Use  . Smoking status: Former Games developer  . Smokeless tobacco: Never Used  Vaping Use  . Vaping Use: Never used  Substance  and Sexual Activity  . Alcohol use: No  . Drug use: No  . Sexual activity: Not on file  Other Topics Concern  . Not on file  Social History Narrative  . Not on file   Social Determinants of Health   Financial Resource Strain:   . Difficulty of Paying Living Expenses: Not on file  Food Insecurity:   . Worried About Programme researcher, broadcasting/film/video in the Last Year: Not on file  . Ran Out of Food in the Last Year: Not on file  Transportation Needs:   . Lack of Transportation (Medical): Not on file  . Lack of Transportation (Non-Medical): Not on file  Physical Activity:   . Days of Exercise per Week: Not on file  . Minutes of Exercise per Session: Not on file  Stress:   . Feeling of Stress : Not on file  Social Connections:   . Frequency of Communication with Friends and Family: Not on file  . Frequency of Social Gatherings with Friends and Family: Not on file  . Attends Religious Services: Not on file  . Active Member of Clubs or Organizations: Not on file  . Attends Banker Meetings: Not on file  . Marital Status: Not on file  Intimate Partner Violence:   . Fear of Current or Ex-Partner: Not on file  . Emotionally Abused: Not on file  . Physically Abused: Not on file  .  Sexually Abused: Not on file    Past Surgical History:  Procedure Laterality Date  . TONSILLECTOMY    . WISDOM TOOTH EXTRACTION      Family History  Problem Relation Age of Onset  . Thyroid disease Mother   . Cancer Mother   . Hypertension Mother   . Healthy Father   . Healthy Sister     No Known Allergies  Current Outpatient Medications on File Prior to Visit  Medication Sig Dispense Refill  . albuterol (VENTOLIN HFA) 108 (90 Base) MCG/ACT inhaler Inhale 2 puffs into the lungs every 6 (six) hours as needed. (Patient not taking: Reported on 01/28/2020) 18 g 0  . benzonatate (TESSALON) 100 MG capsule Take 1 capsule (100 mg total) by mouth 3 (three) times daily as needed for cough. (Patient not  taking: Reported on 01/25/2020) 30 capsule 0  . busPIRone (BUSPAR) 7.5 MG tablet Take 1 tablet (7.5 mg total) by mouth 2 (two) times daily. (Patient not taking: Reported on 01/25/2020) 60 tablet 0  . hydrOXYzine (ATARAX/VISTARIL) 10 MG tablet Take 1 tablet (10 mg total) by mouth 3 (three) times daily as needed for anxiety. (Patient not taking: Reported on 01/25/2020) 6 tablet 0  . ondansetron (ZOFRAN ODT) 4 MG disintegrating tablet Take 1 tablet (4 mg total) by mouth every 8 (eight) hours as needed for nausea or vomiting. 20 tablet 0  . ondansetron (ZOFRAN ODT) 8 MG disintegrating tablet Take 1 tablet (8 mg total) by mouth every 8 (eight) hours as needed for nausea or vomiting. 20 tablet 0   No current facility-administered medications on file prior to visit.    BP 126/89   Pulse 83   Resp 18   Ht 6' (1.829 m)   Wt 277 lb (125.6 kg)   SpO2 97%   BMI 37.57 kg/m       Objective:   Physical Exam  General Mental Status- Alert. General Appearance- Not in acute distress.   Skin General: Color- Normal Color. Moisture- Normal Moisture.  Neck Carotid Arteries- Normal color. Moisture- Normal Moisture. No carotid bruits. No JVD.  Chest and Lung Exam Auscultation: Breath Sounds:-Normal.  Cardiovascular Auscultation:Rythm- Regular. Murmurs & Other Heart Sounds:Auscultation of the heart reveals- No Murmurs.  Abdomen Inspection:-Inspeection Normal. Palpation/Percussion:Note:No mass. Palpation and Percussion of the abdomen reveal- Non Tender, Non Distended + BS, no rebound or guarding.   Neurologic Cranial Nerve exam:- CN III-XII intact(No nystagmus), symmetric smile. Finger to Nose:- Normal/Intact Strength:- 5/5 equal and symmetric strength both upper and lower extremities.      Assessment & Plan:  [phone]   Recent syncope earlier in the week with a little bit of twitching in upper extremity shortly after you are recovering.  Based on this I do want to go ahead and refer you  to a neurologist.  On the referral I did mention the possibility of you getting EEG.  Neurologist will decide on further work-up in that regard.  Also went ahead and put in CT of the head without contrast.  I think this is reasonable in light of syncope, dizziness and recent headache.  We will see if your insurance authorizes the study.  If you do have recurrent syncope or any worsening/other signs or symptoms then recommend ED evaluation.  I gave you work excuse/note.  I do not think he should be driving or working until recent symptoms resolved.  Could extend work note on Tuesday if symptoms persist.  You are feeling better today with some residual slight  dizziness.  Will make meclizine available to use for persisting type dizziness.  Advised to keep yourself well-hydrated.  Please get CBC and CMP today.  Follow-up in 7 days or as needed.  [name], PA-C   Time spent with patient today was 40  minutes which consisted of chart review, discussing diagnosis, work up, treatment, explaining plan going forward and documentation.

## 2020-03-04 NOTE — Telephone Encounter (Signed)
Rx keflex sent to pt pharmacy.  

## 2020-03-04 NOTE — Patient Instructions (Addendum)
Recent syncope earlier in the week with a little bit of twitching in upper extremity shortly after you are recovering.  Based on this I do want to go ahead and refer you to a neurologist.  On the referral I did mention the possibility of you getting EEG.  Neurologist will decide on further work-up in that regard.  Also went ahead and put in CT of the head without contrast.  I think this is reasonable in light of syncope, dizziness and recent headache.  We will see if your insurance authorizes the study.  If you do have recurrent syncope or any worsening/other signs or symptoms then recommend ED evaluation.  I gave you work excuse/note.  I do not think he should be driving or working until recent symptoms resolved.  Could extend work note on Tuesday if symptoms persist.  You are feeling better today with some residual slight dizziness.  Will make meclizine available to use for persisting type dizziness.  Advised to keep yourself well-hydrated.  Please get CBC and CMP today.  Did explain later by my chart he had some recent sinus pressure/pain.  Based on this went ahead and sent in Keflex antibiotic.  Follow-up in 7 days or as needed.

## 2020-03-05 LAB — CBC WITH DIFFERENTIAL/PLATELET
Absolute Monocytes: 525 cells/uL (ref 200–950)
Basophils Absolute: 73 cells/uL (ref 0–200)
Basophils Relative: 1.2 %
Eosinophils Absolute: 171 cells/uL (ref 15–500)
Eosinophils Relative: 2.8 %
HCT: 46.4 % (ref 38.5–50.0)
Hemoglobin: 15.5 g/dL (ref 13.2–17.1)
Lymphs Abs: 1897 cells/uL (ref 850–3900)
MCH: 30.6 pg (ref 27.0–33.0)
MCHC: 33.4 g/dL (ref 32.0–36.0)
MCV: 91.5 fL (ref 80.0–100.0)
MPV: 12.4 fL (ref 7.5–12.5)
Monocytes Relative: 8.6 %
Neutro Abs: 3434 cells/uL (ref 1500–7800)
Neutrophils Relative %: 56.3 %
Platelets: 224 10*3/uL (ref 140–400)
RBC: 5.07 10*6/uL (ref 4.20–5.80)
RDW: 12 % (ref 11.0–15.0)
Total Lymphocyte: 31.1 %
WBC: 6.1 10*3/uL (ref 3.8–10.8)

## 2020-03-05 LAB — COMPREHENSIVE METABOLIC PANEL
AG Ratio: 1.9 (calc) (ref 1.0–2.5)
ALT: 21 U/L (ref 9–46)
AST: 19 U/L (ref 10–40)
Albumin: 5 g/dL (ref 3.6–5.1)
Alkaline phosphatase (APISO): 71 U/L (ref 36–130)
BUN: 13 mg/dL (ref 7–25)
CO2: 27 mmol/L (ref 20–32)
Calcium: 10.5 mg/dL — ABNORMAL HIGH (ref 8.6–10.3)
Chloride: 102 mmol/L (ref 98–110)
Creat: 1.1 mg/dL (ref 0.60–1.35)
Globulin: 2.6 g/dL (calc) (ref 1.9–3.7)
Glucose, Bld: 97 mg/dL (ref 65–99)
Potassium: 4.7 mmol/L (ref 3.5–5.3)
Sodium: 139 mmol/L (ref 135–146)
Total Bilirubin: 1.4 mg/dL — ABNORMAL HIGH (ref 0.2–1.2)
Total Protein: 7.6 g/dL (ref 6.1–8.1)

## 2020-03-06 ENCOUNTER — Ambulatory Visit (HOSPITAL_BASED_OUTPATIENT_CLINIC_OR_DEPARTMENT_OTHER): Admission: RE | Admit: 2020-03-06 | Payer: No Typology Code available for payment source | Source: Ambulatory Visit

## 2020-03-09 ENCOUNTER — Other Ambulatory Visit: Payer: Self-pay

## 2020-03-09 ENCOUNTER — Encounter: Payer: Self-pay | Admitting: Medical

## 2020-03-09 ENCOUNTER — Ambulatory Visit (HOSPITAL_BASED_OUTPATIENT_CLINIC_OR_DEPARTMENT_OTHER): Payer: No Typology Code available for payment source

## 2020-03-09 ENCOUNTER — Ambulatory Visit (HOSPITAL_BASED_OUTPATIENT_CLINIC_OR_DEPARTMENT_OTHER)
Admission: RE | Admit: 2020-03-09 | Discharge: 2020-03-09 | Disposition: A | Discharge status: Home / Self Care | Payer: No Typology Code available for payment source | Source: Ambulatory Visit | Attending: Medical | Admitting: Medical

## 2020-03-09 DIAGNOSIS — R519 Headache, unspecified: Secondary | ICD-10-CM

## 2020-03-09 DIAGNOSIS — H814 Vertigo of central origin: Secondary | ICD-10-CM

## 2020-03-09 DIAGNOSIS — R42 Dizziness and giddiness: Secondary | ICD-10-CM | POA: Diagnosis present

## 2020-03-09 DIAGNOSIS — R55 Syncope and collapse: Secondary | ICD-10-CM | POA: Diagnosis present

## 2020-03-10 ENCOUNTER — Encounter: Payer: Self-pay | Admitting: Diagnostic Neuroimaging

## 2020-03-10 ENCOUNTER — Ambulatory Visit: Payer: No Typology Code available for payment source | Admitting: Diagnostic Neuroimaging

## 2020-03-10 VITALS — BP 141/96 | HR 84 | Ht 70.5 in | Wt 278.0 lb

## 2020-03-10 DIAGNOSIS — R251 Tremor, unspecified: Secondary | ICD-10-CM | POA: Diagnosis not present

## 2020-03-10 DIAGNOSIS — F419 Anxiety disorder, unspecified: Secondary | ICD-10-CM | POA: Diagnosis not present

## 2020-03-10 DIAGNOSIS — G4700 Insomnia, unspecified: Secondary | ICD-10-CM

## 2020-03-10 DIAGNOSIS — R55 Syncope and collapse: Secondary | ICD-10-CM | POA: Diagnosis not present

## 2020-03-10 NOTE — Progress Notes (Signed)
[facility]  PATIENT: Tracy Rich DOB: 1996/06/25  REFERRING CLINICIAN: Saguier, Ramon Dredge, PA-C HISTORY FROM: patient  REASON FOR VISIT: new consult    HISTORICAL  CHIEF COMPLAINT:  Chief Complaint  Patient presents with  . Dizziness and syncope    rm 7 New Pt "on antibiotic for ear infection; had CT yesterday; dizziness gets really bad when I lay down, face gets red; pressure in face, ears front of head; not sleeping much at all"    HISTORY OF PRESENT ILLNESS:   24 year old male here for evaluation of abnormal sensations.  10 days ago patient had pressure sensation in his face and ears and head.  He started to get anxious, shaky, tremors, lightheadedness and dizziness.  Patient had significant problems sleeping.  He is only slept 4 hours total over the past 4 days.  Patient went to his mother's house due to severe symptoms, while holding her hand had a panic attack and passed out.  He had passed out for just about 1 minute and then woke up quickly.  No tongue biting or incontinence.  He has been feeling shaky all over.  He had similar but milder panic attacks and anxiety in the past.   REVIEW OF SYSTEMS: Full 14 system review of systems performed and negative with exception of: As per HPI.  ALLERGIES: No Known Allergies  HOME MEDICATIONS: Outpatient Medications Prior to Visit  Medication Sig Dispense Refill  . albuterol (VENTOLIN HFA) 108 (90 Base) MCG/ACT inhaler Inhale 2 puffs into the lungs every 6 (six) hours as needed. 18 g 0  . cephALEXin (KEFLEX) 500 MG capsule Take 1 capsule (500 mg total) by mouth 2 (two) times daily. 20 capsule 0  . benzonatate (TESSALON) 100 MG capsule Take 1 capsule (100 mg total) by mouth 3 (three) times daily as needed for cough. (Patient not taking: Reported on 01/25/2020) 30 capsule 0  . busPIRone (BUSPAR) 7.5 MG tablet Take 1 tablet (7.5 mg total) by mouth 2 (two) times daily. (Patient not taking: Reported on 01/25/2020) 60  tablet 0  . hydrOXYzine (ATARAX/VISTARIL) 10 MG tablet Take 1 tablet (10 mg total) by mouth 3 (three) times daily as needed for anxiety. (Patient not taking: Reported on 01/25/2020) 6 tablet 0  . meclizine (ANTIVERT) 12.5 MG tablet Take 1 tablet (12.5 mg total) by mouth 3 (three) times daily as needed for dizziness. (Patient not taking: Reported on 03/10/2020) 30 tablet 0  . ondansetron (ZOFRAN ODT) 4 MG disintegrating tablet Take 1 tablet (4 mg total) by mouth every 8 (eight) hours as needed for nausea or vomiting. (Patient not taking: Reported on 03/10/2020) 20 tablet 0  . ondansetron (ZOFRAN ODT) 8 MG disintegrating tablet Take 1 tablet (8 mg total) by mouth every 8 (eight) hours as needed for nausea or vomiting. (Patient not taking: Reported on 03/10/2020) 20 tablet 0   No facility-administered medications prior to visit.    PAST MEDICAL HISTORY: Past Medical History:  Diagnosis Date  . Reflux     PAST SURGICAL HISTORY: Past Surgical History:  Procedure Laterality Date  . TONSILLECTOMY    . WISDOM TOOTH EXTRACTION      FAMILY HISTORY: Family History  Problem Relation Age of Onset  . Thyroid disease Mother   . Cancer Mother   . Hypertension Mother   . Healthy Father   . Healthy Sister     SOCIAL HISTORY: Social History   Socioeconomic History  . Marital status: Single    Spouse name:  Not on file  . Number of children: Not on file  . Years of education: Not on file  . Highest education level: Not on file  Occupational History  . Not on file  Tobacco Use  . Smoking status: Former Games developer  . Smokeless tobacco: Never Used  Vaping Use  . Vaping Use: Never used  Substance and Sexual Activity  . Alcohol use: No  . Drug use: No  . Sexual activity: Not on file  Other Topics Concern  . Not on file  Social History Narrative   Caffeine- once every other day   Social Determinants of Health   Financial Resource Strain:   . Difficulty of Paying Living Expenses: Not on file   Food Insecurity:   . Worried About Programme researcher, broadcasting/film/video in the Last Year: Not on file  . Ran Out of Food in the Last Year: Not on file  Transportation Needs:   . Lack of Transportation (Medical): Not on file  . Lack of Transportation (Non-Medical): Not on file  Physical Activity:   . Days of Exercise per Week: Not on file  . Minutes of Exercise per Session: Not on file  Stress:   . Feeling of Stress : Not on file  Social Connections:   . Frequency of Communication with Friends and Family: Not on file  . Frequency of Social Gatherings with Friends and Family: Not on file  . Attends Religious Services: Not on file  . Active Member of Clubs or Organizations: Not on file  . Attends Banker Meetings: Not on file  . Marital Status: Not on file  Intimate Partner Violence:   . Fear of Current or Ex-Partner: Not on file  . Emotionally Abused: Not on file  . Physically Abused: Not on file  . Sexually Abused: Not on file     PHYSICAL EXAM  GENERAL EXAM/CONSTITUTIONAL: Vitals:  Vitals:   03/10/20 1103  BP: (!) 141/96  Pulse: 84  Weight: 278 lb (126.1 kg)  Height: 5' 10.5" (1.791 m)     Body mass index is 39.33 kg/m. Wt Readings from Last 3 Encounters:  03/10/20 278 lb (126.1 kg)  03/04/20 277 lb (125.6 kg)  01/28/20 (!) 275 lb (124.7 kg)     Patient is in no distress; well developed, nourished and groomed; neck is supple  CARDIOVASCULAR:  Examination of carotid arteries is normal; no carotid bruits  Regular rate and rhythm, no murmurs  Examination of peripheral vascular system by observation and palpation is normal  EYES:  Ophthalmoscopic exam of optic discs and posterior segments is normal; no papilledema or hemorrhages  No exam data present  MUSCULOSKELETAL:  Gait, strength, tone, movements noted in Neurologic exam below  NEUROLOGIC: MENTAL STATUS:  No flowsheet data found.  awake, alert, oriented to person, place and time  recent and  remote memory intact  normal attention and concentration  language fluent, comprehension intact, naming intact  fund of knowledge appropriate  CRANIAL NERVE:   2nd - no papilledema on fundoscopic exam  2nd, 3rd, 4th, 6th - pupils equal and reactive to light, visual fields full to confrontation, extraocular muscles intact, no nystagmus  5th - facial sensation symmetric  7th - facial strength symmetric  8th - hearing intact  9th - palate elevates symmetrically, uvula midline  11th - shoulder shrug symmetric  12th - tongue protrusion midline  MOTOR:   normal bulk and tone, full strength in the BUE, BLE  SENSORY:   normal and  symmetric to light touch, temperature, vibration  COORDINATION:   finger-nose-finger, fine finger movements normal  REFLEXES:   deep tendon reflexes present and symmetric  GAIT/STATION:   narrow based gait     DIAGNOSTIC DATA (LABS, IMAGING, TESTING) - I reviewed patient records, labs, notes, testing and imaging myself where available.  Lab Results  Component Value Date   WBC 6.1 03/04/2020   HGB 15.5 03/04/2020   HCT 46.4 03/04/2020   MCV 91.5 03/04/2020   PLT 224 03/04/2020      Component Value Date/Time   NA 139 03/04/2020 1304   K 4.7 03/04/2020 1304   CL 102 03/04/2020 1304   CO2 27 03/04/2020 1304   GLUCOSE 97 03/04/2020 1304   BUN 13 03/04/2020 1304   CREATININE 1.10 03/04/2020 1304   CALCIUM 10.5 (H) 03/04/2020 1304   PROT 7.6 03/04/2020 1304   ALBUMIN 4.4 01/28/2020 1333   AST 19 03/04/2020 1304   ALT 21 03/04/2020 1304   ALKPHOS 61 01/28/2020 1333   BILITOT 1.4 (H) 03/04/2020 1304   GFRNONAA >60 01/25/2020 1153   GFRAA >60 01/25/2020 1153   Lab Results  Component Value Date   CHOL 145 03/17/2019   HDL 46.60 03/17/2019   LDLCALC 71 03/17/2019   TRIG 137.0 03/17/2019   CHOLHDL 3 03/17/2019   No results found for: HGBA1C No results found for: VITAMINB12 Lab Results  Component Value Date   TSH 2.82  03/17/2019     03/09/20 CT head [I reviewed images myself and agree with interpretation. -VRP]  - Mild opacification in several visualized mastoid air cells on the right. Study otherwise unremarkable.    ASSESSMENT AND PLAN  24 y.o. year old male here with new onset of abnormal sensations, tremor, anxiety, insomnia, one episode of syncope during panic attack.  We will proceed with further work-up with MRI of the brain.  Recommend to consider medical and cardiac work-up of syncope per PCP.  May need anxiety treatment as well.   Dx:  1. Syncope, unspecified syncope type   2. Tremor   3. Anxiety   4. Insomnia, unspecified type      PLAN:  TREMOR / ANXIETY / INSOMNIA - follow up with PCP  LIGHTHEADED / SYNCOPE / SHAKING (likely convulsive syncope; unlikely seizure) - check MRI brain  - follow up with PCP; consider cardiology  - According to [place] law, you can not drive unless you are seizure / syncope free for at least 6 months and under physician's care.   - Please maintain precautions. Do not participate in activities where a loss of awareness could harm you or someone else. No swimming alone, no tub bathing, no hot tubs, no driving, no operating motorized vehicles (cars, ATVs, motocycles, etc), lawnmowers, power tools or firearms. No standing at heights, such as rooftops, ladders or stairs. Avoid hot objects such as stoves, heaters, open fires. Wear a helmet when riding a bicycle, scooter, skateboard, etc. and avoid areas of traffic. Set your water heater to 120 degrees or less.   Orders Placed This Encounter  Procedures  . MR BRAIN W WO CONTRAST   Return for pending if symptoms worsen or fail to improve.    Suanne Marker, MD 03/10/2020, 11:34 AM Certified in Neurology, Neurophysiology and Neuroimaging  [facility] [address] [place], [place] [address] [phone]

## 2020-03-10 NOTE — Patient Instructions (Signed)
TREMOR / ANXIETY / INSOMNIA - follow up with PCP   LIGHTHEADED / SYNCOPE - check MRI brain  - follow up with PCP; consider cardiology  - According to [place] law, you can not drive unless you are seizure / syncope free for at least 6 months and under physician's care.   - Please maintain precautions. Do not participate in activities where a loss of awareness could harm you or someone else. No swimming alone, no tub bathing, no hot tubs, no driving, no operating motorized vehicles (cars, ATVs, motocycles, etc), lawnmowers, power tools or firearms. No standing at heights, such as rooftops, ladders or stairs. Avoid hot objects such as stoves, heaters, open fires. Wear a helmet when riding a bicycle, scooter, skateboard, etc. and avoid areas of traffic. Set your water heater to 120 degrees or less.

## 2020-03-11 ENCOUNTER — Ambulatory Visit (INDEPENDENT_AMBULATORY_CARE_PROVIDER_SITE_OTHER): Payer: No Typology Code available for payment source | Admitting: Medical

## 2020-03-11 ENCOUNTER — Telehealth: Payer: Self-pay | Admitting: Diagnostic Neuroimaging

## 2020-03-11 ENCOUNTER — Other Ambulatory Visit: Payer: Self-pay

## 2020-03-11 VITALS — BP 135/91 | HR 74 | Temp 98.5°F | Resp 18 | Ht 70.0 in | Wt 277.0 lb

## 2020-03-11 DIAGNOSIS — F419 Anxiety disorder, unspecified: Secondary | ICD-10-CM | POA: Diagnosis not present

## 2020-03-11 DIAGNOSIS — R748 Abnormal levels of other serum enzymes: Secondary | ICD-10-CM | POA: Diagnosis not present

## 2020-03-11 LAB — COMPREHENSIVE METABOLIC PANEL
AG Ratio: 2.4 (calc) (ref 1.0–2.5)
ALT: 20 U/L (ref 9–46)
AST: 17 U/L (ref 10–40)
Albumin: 5.1 g/dL (ref 3.6–5.1)
Alkaline phosphatase (APISO): 66 U/L (ref 36–130)
BUN: 12 mg/dL (ref 7–25)
CO2: 28 mmol/L (ref 20–32)
Calcium: 10.2 mg/dL (ref 8.6–10.3)
Chloride: 105 mmol/L (ref 98–110)
Creat: 1.18 mg/dL (ref 0.60–1.35)
Globulin: 2.1 g/dL (calc) (ref 1.9–3.7)
Glucose, Bld: 99 mg/dL (ref 65–99)
Potassium: 4.7 mmol/L (ref 3.5–5.3)
Sodium: 140 mmol/L (ref 135–146)
Total Bilirubin: 1.6 mg/dL — ABNORMAL HIGH (ref 0.2–1.2)
Total Protein: 7.2 g/dL (ref 6.1–8.1)

## 2020-03-11 MED ORDER — CLONAZEPAM 0.5 MG PO TABS
ORAL_TABLET | ORAL | 0 refills | Status: DC
Start: 2020-03-11 — End: 2020-05-05

## 2020-03-11 MED ORDER — BUSPIRONE HCL 15 MG PO TABS: 15.0000 mg | ORAL_TABLET | Freq: Two times a day (BID) | ORAL | 0 refills | Status: DC

## 2020-03-11 MED ORDER — BUSPIRONE HCL 7.5 MG PO TABS: 7.5000 mg | ORAL_TABLET | Freq: Two times a day (BID) | ORAL | 0 refills | Status: DC

## 2020-03-11 MED ORDER — FLUTICASONE PROPIONATE 50 MCG/ACT NA SUSP
2.0000 | Freq: Every day | NASAL | 1 refills | Status: DC
Start: 2020-03-11 — End: 2020-09-23

## 2020-03-11 MED FILL — FLUTICASONE PROP 50 MCG SPR: 50 | 30 days supply | Qty: 16 | Fill #0

## 2020-03-11 MED FILL — clonazePAM 0.5 MG TABS: 0.5 | 2 days supply | Qty: 3 | Fill #0

## 2020-03-11 MED FILL — busPIRone HCL 15 MG TABS: 15 | 30 days supply | Qty: 60 | Fill #0

## 2020-03-11 NOTE — Patient Instructions (Signed)
For persisting but improved sinus pressure continue keflex. Update me in 3-4 days when you are done with keflex. If pressure persisting will extend antibiotic. Today adding flonase. Rocephin im offered today but declined.  Med option discussed for anxiety today. Decided on higher dose buspar with limited number of clonazepam made available to use sparingly if needed panic attack. Take buspar daily and full effect may take a week or two.  Follow mri for your syncope history. If mri negative and near syncope or syncope occurs again then would refer to cardiologist.   Follow up in one month or as needed

## 2020-03-11 NOTE — Progress Notes (Signed)
Subjective:    Patient ID: Tracy Rich, male    DOB: 1995/12/13, 24 y.o.   MRN: [id number]  HPI  Pt in for follow up.  Pt saw neurologist recently. MRI of his head ordered. See CT report.   Pt still has some sinus pressure. He also has some bilateral ear pressure. Rt side little worse. I had prescribed keflex after he send my chart message about sinus pressure. Pt states his sinus pressure is less than he had last week. He states he has about 3 days left of keflex.   Pt states he is having severe anxiety recently. He states his anxiety is severe at night. Pt states logically he states should not be worrying but is.   Pt has tried buspar in the past for lower level anxiety. Pt states low dose buspar did not help. Pt feeling that he is having panic attacks. Pt states not depressed. Pt not drinking any alcohol.  Options discussed higher dose bupsar vs effexor and clonazepam combination.   No recent syncope episodes.     Review of Systems  Constitutional: Negative for chills, fatigue and fever.  Respiratory: Negative for cough, chest tightness, shortness of breath and wheezing.   Cardiovascular: Negative for chest pain and palpitations.  Gastrointestinal: Negative for abdominal pain.  Musculoskeletal: Negative for back pain.  Skin: Negative for rash.  Neurological: Negative for dizziness, speech difficulty, weakness, numbness and headaches.  Hematological: Negative for adenopathy. Does not bruise/bleed easily.  Psychiatric/Behavioral: Negative for behavioral problems, dysphoric mood, sleep disturbance and suicidal ideas. The patient is nervous/anxious.     Past Medical History:  Diagnosis Date  . Reflux      Social History   Socioeconomic History  . Marital status: Single    Spouse name: Not on file  . Number of children: Not on file  . Years of education: Not on file  . Highest education level: Not on file  Occupational History  . Not on file  Tobacco Use  .  Smoking status: Former Games developer  . Smokeless tobacco: Never Used  Vaping Use  . Vaping Use: Never used  Substance and Sexual Activity  . Alcohol use: No  . Drug use: No  . Sexual activity: Not on file  Other Topics Concern  . Not on file  Social History Narrative   Caffeine- once every other day   Social Determinants of Health   Financial Resource Strain:   . Difficulty of Paying Living Expenses: Not on file  Food Insecurity:   . Worried About Programme researcher, broadcasting/film/video in the Last Year: Not on file  . Ran Out of Food in the Last Year: Not on file  Transportation Needs:   . Lack of Transportation (Medical): Not on file  . Lack of Transportation (Non-Medical): Not on file  Physical Activity:   . Days of Exercise per Week: Not on file  . Minutes of Exercise per Session: Not on file  Stress:   . Feeling of Stress : Not on file  Social Connections:   . Frequency of Communication with Friends and Family: Not on file  . Frequency of Social Gatherings with Friends and Family: Not on file  . Attends Religious Services: Not on file  . Active Member of Clubs or Organizations: Not on file  . Attends Banker Meetings: Not on file  . Marital Status: Not on file  Intimate Partner Violence:   . Fear of Current or Ex-Partner: Not on file  .  Emotionally Abused: Not on file  . Physically Abused: Not on file  . Sexually Abused: Not on file    Past Surgical History:  Procedure Laterality Date  . TONSILLECTOMY    . WISDOM TOOTH EXTRACTION      Family History  Problem Relation Age of Onset  . Thyroid disease Mother   . Cancer Mother   . Hypertension Mother   . Healthy Father   . Healthy Sister     No Known Allergies  Current Outpatient Medications on File Prior to Visit  Medication Sig Dispense Refill  . albuterol (VENTOLIN HFA) 108 (90 Base) MCG/ACT inhaler Inhale 2 puffs into the lungs every 6 (six) hours as needed. 18 g 0  . benzonatate (TESSALON) 100 MG capsule Take 1  capsule (100 mg total) by mouth 3 (three) times daily as needed for cough. (Patient not taking: Reported on 01/25/2020) 30 capsule 0  . cephALEXin (KEFLEX) 500 MG capsule Take 1 capsule (500 mg total) by mouth 2 (two) times daily. 20 capsule 0  . hydrOXYzine (ATARAX/VISTARIL) 10 MG tablet Take 1 tablet (10 mg total) by mouth 3 (three) times daily as needed for anxiety. (Patient not taking: Reported on 01/25/2020) 6 tablet 0  . meclizine (ANTIVERT) 12.5 MG tablet Take 1 tablet (12.5 mg total) by mouth 3 (three) times daily as needed for dizziness. (Patient not taking: Reported on 03/10/2020) 30 tablet 0  . ondansetron (ZOFRAN ODT) 4 MG disintegrating tablet Take 1 tablet (4 mg total) by mouth every 8 (eight) hours as needed for nausea or vomiting. (Patient not taking: Reported on 03/10/2020) 20 tablet 0  . ondansetron (ZOFRAN ODT) 8 MG disintegrating tablet Take 1 tablet (8 mg total) by mouth every 8 (eight) hours as needed for nausea or vomiting. (Patient not taking: Reported on 03/10/2020) 20 tablet 0   No current facility-administered medications on file prior to visit.    BP (!) 135/91   Pulse 74   Temp 98.5 F (36.9 C) (Oral)   Resp 18   Ht 5\' 10"  (1.778 m)   Wt 277 lb (125.6 kg)   SpO2 98%   BMI 39.75 kg/m       Objective:   Physical Exam   General Mental Status- Alert. General Appearance- Not in acute distress.   Skin General: Color- Normal Color. Moisture- Normal Moisture.  Neck Carotid Arteries- Normal color. Moisture- Normal Moisture. No carotid bruits. No JVD.  Chest and Lung Exam Auscultation: Breath Sounds:-Normal.  Cardiovascular Auscultation:Rythm- Regular. Murmurs & Other Heart Sounds:Auscultation of the heart reveals- No Murmurs.  Abdomen Inspection:-Inspeection Normal. Palpation/Percussion:Note:No mass. Palpation and Percussion of the abdomen reveal- Non Tender, Non Distended + BS, no rebound or guarding.    Neurologic Cranial Nerve exam:- CN III-XII  intact(No nystagmus), symmetric smile. Strength:- 5/5 equal and symmetric strength both upper and lower extremities.   heent- canal clear and tm normal. No mastoid tenderness. Very faint maxillary sinus pressure.    Assessment & Plan:  For persisting but improved sinus pressure continue keflex. Update me in 3-4 days when you are done with keflex. If pressure persisting will extend antibiotic. Today adding flonase. Rocephin im offered today but declined.  Med option discussed for anxiety today. Decided on higher dose buspar with limited number of clonazepam made available to use sparingly if needed panic attack. Take buspar daily and full effect may take a week or two.  Follow mri for your syncope history. If mri negative and near syncope or syncope occurs again  then would refer to cardiologist.   Follow up in one month or as needed

## 2020-03-11 NOTE — Telephone Encounter (Signed)
no to the covid questions MR Brain w/wo contrast Dr. Verlene Mayer [facility] Berkley Harvey: [id number] (exp. 03/16/20 to 04/15/20). Patient is scheduled at [facility] for 03/17/20.

## 2020-03-17 ENCOUNTER — Other Ambulatory Visit: Payer: Self-pay | Admitting: Diagnostic Neuroimaging

## 2020-03-17 ENCOUNTER — Other Ambulatory Visit: Payer: Self-pay

## 2020-03-17 ENCOUNTER — Ambulatory Visit: Payer: No Typology Code available for payment source

## 2020-03-17 ENCOUNTER — Ambulatory Visit: Payer: No Typology Code available for payment source | Admitting: Medical

## 2020-03-17 DIAGNOSIS — R55 Syncope and collapse: Secondary | ICD-10-CM

## 2020-03-17 DIAGNOSIS — F419 Anxiety disorder, unspecified: Secondary | ICD-10-CM | POA: Diagnosis not present

## 2020-03-17 DIAGNOSIS — R251 Tremor, unspecified: Secondary | ICD-10-CM

## 2020-03-18 ENCOUNTER — Ambulatory Visit (INDEPENDENT_AMBULATORY_CARE_PROVIDER_SITE_OTHER): Payer: No Typology Code available for payment source | Admitting: Medical

## 2020-03-18 ENCOUNTER — Other Ambulatory Visit: Payer: Self-pay

## 2020-03-18 ENCOUNTER — Encounter: Payer: Self-pay | Admitting: *Deleted

## 2020-03-18 ENCOUNTER — Ambulatory Visit (HOSPITAL_BASED_OUTPATIENT_CLINIC_OR_DEPARTMENT_OTHER)
Admission: RE | Admit: 2020-03-18 | Discharge: 2020-03-18 | Disposition: A | Discharge status: Home / Self Care | Payer: No Typology Code available for payment source | Source: Ambulatory Visit | Attending: Medical | Admitting: Medical

## 2020-03-18 VITALS — BP 129/80 | HR 67 | Resp 18 | Ht 70.0 in | Wt 275.4 lb

## 2020-03-18 DIAGNOSIS — G5602 Carpal tunnel syndrome, left upper limb: Secondary | ICD-10-CM

## 2020-03-18 DIAGNOSIS — M545 Low back pain, unspecified: Secondary | ICD-10-CM

## 2020-03-18 DIAGNOSIS — J309 Allergic rhinitis, unspecified: Secondary | ICD-10-CM

## 2020-03-18 DIAGNOSIS — R519 Headache, unspecified: Secondary | ICD-10-CM | POA: Diagnosis not present

## 2020-03-18 DIAGNOSIS — S46811A Strain of other muscles, fascia and tendons at shoulder and upper arm level, right arm, initial encounter: Secondary | ICD-10-CM

## 2020-03-18 DIAGNOSIS — F419 Anxiety disorder, unspecified: Secondary | ICD-10-CM

## 2020-03-18 MED ORDER — CYCLOBENZAPRINE HCL 5 MG PO TABS
5.0000 mg | ORAL_TABLET | Freq: Every day | ORAL | 0 refills | Status: DC
Start: 2020-03-18 — End: 2020-09-23

## 2020-03-18 MED FILL — CYCLOBENZAPRINE HCL 5 MG TA: 5 | 4 days supply | Qty: 4 | Fill #0

## 2020-03-18 NOTE — Patient Instructions (Signed)
You have had recent anxiety and report that the days that you are on BuSpar that anxiety was worse.  Since stopping BuSpar you report that your anxiety has been controlled.  Only recommend presently use clonazepam for severe anxiety/panic attack.  If you begin to have recurrent daily low level anxiety then would recommend prescribing low-dose Effexor.  Please keep me updated.  Recent frontal sinus region pain with improvement using Flonase.  So recommend that you keep using Flonase as this suggests allergies.  Previously used Keflex and CT/MRI imaging did not indicate sinus infection.  So not prescribing additional antibiotic presently.  You speculated about potential tension headache as well.  You do have some right trapezius tenderness to palpation on exam.  Describe recent sinus pressure as slight headache.  So making Flexeril low-dose 5 mg to use at night over the next 4 nights.  Let me know if this seems to help recent neck pain/frontal region pressure/headache.  Recent left hand tingling sensation.  On physical exam wrist manipulation induces sensation still seems to be consistent with carpal tunnel syndrome.  Start ibuprofen 400 mg every 8 hours for the next 5 to 7 days.  Ibuprofen might help with potential tension headache as well.  Recent 2 months of back pain.  It is in the region of the upper lumbar and lower thoracic junction.  Will get x-ray of lumbar spine and ask radiology to include use of lower thoracic vertebrae as well.  Use ibuprofen as well for this pain.  Can try back exercises below as tolerated.  Follow-up in 2 weeks or as needed. Back Exercises These exercises help to make your trunk and back strong. They also help to keep the lower back flexible. Doing these exercises can help to prevent back pain or lessen existing pain.  If you have back pain, try to do these exercises 2-3 times each day or as told by your doctor.  As you get better, do the exercises once each day.  Repeat the exercises more often as told by your doctor.  To stop back pain from coming back, do the exercises once each day, or as told by your doctor. Exercises Single knee to chest Do these steps 3-5 times in a row for each leg: 1. Lie on your back on a firm bed or the floor with your legs stretched out. 2. Bring one knee to your chest. 3. Grab your knee or thigh with both hands and hold them it in place. 4. Pull on your knee until you feel a gentle stretch in your lower back or buttocks. 5. Keep doing the stretch for 10-30 seconds. 6. Slowly let go of your leg and straighten it. Pelvic tilt Do these steps 5-10 times in a row: 1. Lie on your back on a firm bed or the floor with your legs stretched out. 2. Bend your knees so they point up to the ceiling. Your feet should be flat on the floor. 3. Tighten your lower belly (abdomen) muscles to press your lower back against the floor. This will make your tailbone point up to the ceiling instead of pointing down to your feet or the floor. 4. Stay in this position for 5-10 seconds while you gently tighten your muscles and breathe evenly. Cat-cow Do these steps until your lower back bends more easily: 1. Get on your hands and knees on a firm surface. Keep your hands under your shoulders, and keep your knees under your hips. You may put padding under  your knees. 2. Let your head hang down toward your chest. Tighten (contract) the muscles in your belly. Point your tailbone toward the floor so your lower back becomes rounded like the back of a cat. 3. Stay in this position for 5 seconds. 4. Slowly lift your head. Let the muscles of your belly relax. Point your tailbone up toward the ceiling so your back forms a sagging arch like the back of a cow. 5. Stay in this position for 5 seconds.  Press-ups Do these steps 5-10 times in a row: 1. Lie on your belly (face-down) on the floor. 2. Place your hands near your head, about shoulder-width  apart. 3. While you keep your back relaxed and keep your hips on the floor, slowly straighten your arms to raise the top half of your body and lift your shoulders. Do not use your back muscles. You may change where you place your hands in order to make yourself more comfortable. 4. Stay in this position for 5 seconds. 5. Slowly return to lying flat on the floor.  Bridges Do these steps 10 times in a row: 1. Lie on your back on a firm surface. 2. Bend your knees so they point up to the ceiling. Your feet should be flat on the floor. Your arms should be flat at your sides, next to your body. 3. Tighten your butt muscles and lift your butt off the floor until your waist is almost as high as your knees. If you do not feel the muscles working in your butt and the back of your thighs, slide your feet 1-2 inches farther away from your butt. 4. Stay in this position for 3-5 seconds. 5. Slowly lower your butt to the floor, and let your butt muscles relax. If this exercise is too easy, try doing it with your arms crossed over your chest. Belly crunches Do these steps 5-10 times in a row: 1. Lie on your back on a firm bed or the floor with your legs stretched out. 2. Bend your knees so they point up to the ceiling. Your feet should be flat on the floor. 3. Cross your arms over your chest. 4. Tip your chin a little bit toward your chest but do not bend your neck. 5. Tighten your belly muscles and slowly raise your chest just enough to lift your shoulder blades a tiny bit off of the floor. Avoid raising your body higher than that, because it can put too much stress on your low back. 6. Slowly lower your chest and your head to the floor. Back lifts Do these steps 5-10 times in a row: 1. Lie on your belly (face-down) with your arms at your sides, and rest your forehead on the floor. 2. Tighten the muscles in your legs and your butt. 3. Slowly lift your chest off of the floor while you keep your hips on the  floor. Keep the back of your head in line with the curve in your back. Look at the floor while you do this. 4. Stay in this position for 3-5 seconds. 5. Slowly lower your chest and your face to the floor. Contact a doctor if:  Your back pain gets a lot worse when you do an exercise.  Your back pain does not get better 2 hours after you exercise. If you have any of these problems, stop doing the exercises. Do not do them again unless your doctor says it is okay. Get help right away if:  You have  sudden, very bad back pain. If this happens, stop doing the exercises. Do not do them again unless your doctor says it is okay. This information is not intended to replace advice given to you by your health care provider. Make sure you discuss any questions you have with your health care provider. Document Revised: 03/14/2018 Document Reviewed: 03/14/2018 Elsevier Patient Education  2020 [employer].

## 2020-03-18 NOTE — Progress Notes (Signed)
Subjective:    Patient ID: Tracy Rich, male    DOB: Dec 05, 1995, 24 y.o.   MRN: [id number]  HPI  Pt in for follow up on anxiety.   He did try buspar 15 mg dose. He took it for 3 days. He states with meds seemed to get more anxious and felt like panic attack. He stopped buspar.   Since last visit and stopping buspar he states less anxiety. Pt never used clonazepam. He states does not need extra med presently.  Pt had syncope 1 month ago. Neurology work up showed negative CT and just recent got negative mri. Pt ekg was negative when I first evauated.  Pt does report some frontal sinus area. On 03-11-2020 I rx'd keflex for possible sinus infection. He thought that helped. He has been using flonase in am and he states this seems to help his frontal region ha. Since he responds to flonase he thinks must have some allergies.  Pt also wonders if possible tension ha component maybe causing frontal ha/sinus region pain.  Back pain for 2 months. No injury or fall.  Also transient left hand tingling yesterday before he got mri.     Review of Systems  Constitutional: Negative for chills, fatigue and fever.  HENT: Positive for congestion. Negative for ear discharge, ear pain, nosebleeds and postnasal drip.   Respiratory: Negative for cough, chest tightness, shortness of breath and wheezing.   Cardiovascular: Negative for chest pain and palpitations.  Musculoskeletal:       Driving to get mri noticed tingling to his left hand. No night time numbness.   Neurological: Positive for headaches. Negative for dizziness.       See  hpi.  Hematological: Negative for adenopathy. Does not bruise/bleed easily.  Psychiatric/Behavioral: Negative for behavioral problems, sleep disturbance and suicidal ideas. The patient is nervous/anxious.     Past Medical History:  Diagnosis Date  . Reflux      Social History   Socioeconomic History  . Marital status: Single    Spouse name: Not on file  .  Number of children: Not on file  . Years of education: Not on file  . Highest education level: Not on file  Occupational History  . Not on file  Tobacco Use  . Smoking status: Former Games developer  . Smokeless tobacco: Never Used  Vaping Use  . Vaping Use: Never used  Substance and Sexual Activity  . Alcohol use: No  . Drug use: No  . Sexual activity: Not on file  Other Topics Concern  . Not on file  Social History Narrative   Caffeine- once every other day   Social Determinants of Health   Financial Resource Strain:   . Difficulty of Paying Living Expenses: Not on file  Food Insecurity:   . Worried About Programme researcher, broadcasting/film/video in the Last Year: Not on file  . Ran Out of Food in the Last Year: Not on file  Transportation Needs:   . Lack of Transportation (Medical): Not on file  . Lack of Transportation (Non-Medical): Not on file  Physical Activity:   . Days of Exercise per Week: Not on file  . Minutes of Exercise per Session: Not on file  Stress:   . Feeling of Stress : Not on file  Social Connections:   . Frequency of Communication with Friends and Family: Not on file  . Frequency of Social Gatherings with Friends and Family: Not on file  . Attends Religious Services: Not  on file  . Active Member of Clubs or Organizations: Not on file  . Attends Banker Meetings: Not on file  . Marital Status: Not on file  Intimate Partner Violence:   . Fear of Current or Ex-Partner: Not on file  . Emotionally Abused: Not on file  . Physically Abused: Not on file  . Sexually Abused: Not on file    Past Surgical History:  Procedure Laterality Date  . TONSILLECTOMY    . WISDOM TOOTH EXTRACTION      Family History  Problem Relation Age of Onset  . Thyroid disease Mother   . Cancer Mother   . Hypertension Mother   . Healthy Father   . Healthy Sister     No Known Allergies  Current Outpatient Medications on File Prior to Visit  Medication Sig Dispense Refill  .  albuterol (VENTOLIN HFA) 108 (90 Base) MCG/ACT inhaler Inhale 2 puffs into the lungs every 6 (six) hours as needed. 18 g 0  . benzonatate (TESSALON) 100 MG capsule Take 1 capsule (100 mg total) by mouth 3 (three) times daily as needed for cough. (Patient not taking: Reported on 01/25/2020) 30 capsule 0  . busPIRone (BUSPAR) 15 MG tablet Take 1 tablet (15 mg total) by mouth 2 (two) times daily. Fill 15 mg rx. (Patient not taking: Reported on 03/18/2020) 60 tablet 0  . cephALEXin (KEFLEX) 500 MG capsule Take 1 capsule (500 mg total) by mouth 2 (two) times daily. 20 capsule 0  . clonazePAM (KLONOPIN) 0.5 MG tablet 1 tab po bid prn severe anxiety/panic 3 tablet 0  . fluticasone (FLONASE) 50 MCG/ACT nasal spray Place 2 sprays into both nostrils daily. 16 g 1  . hydrOXYzine (ATARAX/VISTARIL) 10 MG tablet Take 1 tablet (10 mg total) by mouth 3 (three) times daily as needed for anxiety. (Patient not taking: Reported on 01/25/2020) 6 tablet 0  . meclizine (ANTIVERT) 12.5 MG tablet Take 1 tablet (12.5 mg total) by mouth 3 (three) times daily as needed for dizziness. (Patient not taking: Reported on 03/10/2020) 30 tablet 0  . ondansetron (ZOFRAN ODT) 4 MG disintegrating tablet Take 1 tablet (4 mg total) by mouth every 8 (eight) hours as needed for nausea or vomiting. (Patient not taking: Reported on 03/10/2020) 20 tablet 0  . ondansetron (ZOFRAN ODT) 8 MG disintegrating tablet Take 1 tablet (8 mg total) by mouth every 8 (eight) hours as needed for nausea or vomiting. (Patient not taking: Reported on 03/10/2020) 20 tablet 0   No current facility-administered medications on file prior to visit.    BP 129/80   Pulse 67   Resp 18   Ht 5\' 10"  (1.778 m)   Wt 275 lb 6.4 oz (124.9 kg)   SpO2 98%   BMI 39.52 kg/m       Objective:   Physical Exam  General Mental Status- Alert. General Appearance- Not in acute distress.   Skin General: Color- Normal Color. Moisture- Normal Moisture.  Neck Carotid Arteries-  Normal color. Moisture- Normal Moisture. No carotid bruits. No JVD.  Chest and Lung Exam Auscultation: Breath Sounds:-Normal.  Cardiovascular Auscultation:Rythm- Regular. Murmurs & Other Heart Sounds:Auscultation of the heart reveals- No Murmurs.  Abdomen Inspection:-Inspeection Normal. Palpation/Percussion:Note:No mass. Palpation and Percussion of the abdomen reveal- Non Tender, Non Distended + BS, no rebound or guarding.  heent- faint frontal sinus region tenderness.    Neurologic Cranial Nerve exam:- CN III-XII intact(No nystagmus), symmetric smile. Strength:- 5/5 equal and symmetric strength both upper and lower  extremities.  Left upper ext/wrist- wrist manipulation causes tingling sensation.  Back- mid tspine and lpsine junction pain.    Assessment & Plan:  You have had recent anxiety and report that the days that you are on BuSpar that anxiety was worse.  Since stopping BuSpar you report that your anxiety has been controlled.  Only recommend presently use clonazepam for severe anxiety/panic attack.  If you begin to have recurrent daily low level anxiety then would recommend prescribing low-dose Effexor.  Please keep me updated.  Recent frontal sinus region pain with improvement using Flonase.  So recommend that you keep using Flonase as this suggests allergies.  Previously used Keflex and CT/MRI imaging did not indicate sinus infection.  So not prescribing additional antibiotic presently.  You speculated about potential tension headache as well.  You do have some right trapezius tenderness to palpation on exam.  Describe recent sinus pressure as slight headache.  So making Flexeril low-dose 5 mg to use at night over the next 4 nights.  Let me know if this seems to help recent neck pain/frontal region pressure/headache.  Recent left hand tingling sensation.  On physical exam wrist manipulation induces sensation still seems to be consistent with carpal tunnel syndrome.  Start  ibuprofen 400 mg every 8 hours for the next 5 to 7 days.  Ibuprofen might help with potential tension headache as well.  Recent 2 months of back pain.  It is in the region of the upper lumbar and lower thoracic junction.  Will get x-ray of lumbar spine and ask radiology to include use of lower thoracic vertebrae as well.  Use ibuprofen as well for this pain.  Can try back exercises below as tolerated.  Follow-up in 2 weeks or as needed.  [name], PA-C   Time spent with patient today was 44  minutes which consisted of chart review, discussing diagnosis, work up, treatment, answering questions  and documentation.

## 2020-03-19 ENCOUNTER — Other Ambulatory Visit: Payer: Self-pay | Admitting: Sleep Medicine

## 2020-03-19 ENCOUNTER — Other Ambulatory Visit: Payer: No Typology Code available for payment source

## 2020-03-19 DIAGNOSIS — Z20822 Contact with and (suspected) exposure to covid-19: Secondary | ICD-10-CM

## 2020-03-22 LAB — NOVEL CORONAVIRUS, NAA: SARS-CoV-2, NAA: NOT DETECTED

## 2020-04-09 ENCOUNTER — Ambulatory Visit: Payer: No Typology Code available for payment source | Admitting: Medical

## 2020-05-05 ENCOUNTER — Encounter: Payer: Self-pay | Admitting: Medical

## 2020-05-05 ENCOUNTER — Ambulatory Visit (INDEPENDENT_AMBULATORY_CARE_PROVIDER_SITE_OTHER): Payer: No Typology Code available for payment source | Admitting: Medical

## 2020-05-05 ENCOUNTER — Other Ambulatory Visit: Payer: Self-pay | Admitting: Medical

## 2020-05-05 ENCOUNTER — Other Ambulatory Visit: Payer: Self-pay

## 2020-05-05 VITALS — BP 139/80 | HR 85 | Resp 18 | Ht 70.0 in | Wt 269.8 lb

## 2020-05-05 DIAGNOSIS — I10 Essential (primary) hypertension: Secondary | ICD-10-CM

## 2020-05-05 DIAGNOSIS — R591 Generalized enlarged lymph nodes: Secondary | ICD-10-CM

## 2020-05-05 DIAGNOSIS — B9789 Other viral agents as the cause of diseases classified elsewhere: Secondary | ICD-10-CM

## 2020-05-05 DIAGNOSIS — F419 Anxiety disorder, unspecified: Secondary | ICD-10-CM | POA: Diagnosis not present

## 2020-05-05 DIAGNOSIS — K121 Other forms of stomatitis: Secondary | ICD-10-CM

## 2020-05-05 DIAGNOSIS — Z79899 Other long term (current) drug therapy: Secondary | ICD-10-CM

## 2020-05-05 DIAGNOSIS — L089 Local infection of the skin and subcutaneous tissue, unspecified: Secondary | ICD-10-CM | POA: Diagnosis not present

## 2020-05-05 MED ORDER — CEPHALEXIN 500 MG PO CAPS: 500.0000 mg | ORAL_CAPSULE | Freq: Two times a day (BID) | ORAL | 0 refills | Status: DC

## 2020-05-05 MED ORDER — CLONAZEPAM 0.25 MG PO TBDP: 0.2500 mg | ORAL_TABLET | Freq: Every day | ORAL | 0 refills | Status: DC

## 2020-05-05 MED FILL — CEPHALEXIN 500 MG CAPSULE: 500 | 10 days supply | Qty: 20 | Fill #0

## 2020-05-05 MED FILL — clonazePAM 0.5 MG TABS: 0.5 | 30 days supply | Qty: 15 | Fill #0

## 2020-05-05 NOTE — Progress Notes (Signed)
Subjective:    Patient ID: Tracy Rich, male    DOB: 1995-12-24, 24 y.o.   MRN: [id number]  HPI Pt in for follow up.   Last visit buspar stopped. See last note. He felt buspar did not help. Pt did use clonazepam 2 weeks after I saw him. He states that did stop panic anxiety. He often states he will get panic attack and severe anxiety at night.  He tried buspar again and still did not help.  During the day he is not having anxiety.  He states severe anxiety at night. States will have anxiety about 4 nights a week.  Pt also has some mild submandibular lymph node swelling. No teeth pain. No sore throat.  He states had a sore underneath his tongue on Friday day that he thought lymph nodes were swollen.  Left side neck he felt small more prominent lymph nodes. Also redness to skin in same area.  Bp at home is 140/90 often despite conservative measures.      Review of Systems  Constitutional: Negative for chills, fatigue and fever.  Respiratory: Negative for cough, chest tightness, shortness of breath and wheezing.   Cardiovascular: Negative for chest pain and palpitations.  Genitourinary: Negative for enuresis.  Musculoskeletal: Negative for back pain and myalgias.  Skin: Negative for rash.  Neurological: Negative for dizziness, seizures, speech difficulty, weakness and headaches.  Hematological: Negative for adenopathy. Does not bruise/bleed easily.  Psychiatric/Behavioral: Negative for decreased concentration, dysphoric mood, self-injury and suicidal ideas. The patient is nervous/anxious.    Past Medical History:  Diagnosis Date  . Reflux      Social History   Socioeconomic History  . Marital status: Single    Spouse name: Not on file  . Number of children: Not on file  . Years of education: Not on file  . Highest education level: Not on file  Occupational History  . Not on file  Tobacco Use  . Smoking status: Former Games developer  . Smokeless tobacco: Never Used    Vaping Use  . Vaping Use: Never used  Substance and Sexual Activity  . Alcohol use: No  . Drug use: No  . Sexual activity: Not on file  Other Topics Concern  . Not on file  Social History Narrative   Caffeine- once every other day   Social Determinants of Health   Financial Resource Strain:   . Difficulty of Paying Living Expenses: Not on file  Food Insecurity:   . Worried About Programme researcher, broadcasting/film/video in the Last Year: Not on file  . Ran Out of Food in the Last Year: Not on file  Transportation Needs:   . Lack of Transportation (Medical): Not on file  . Lack of Transportation (Non-Medical): Not on file  Physical Activity:   . Days of Exercise per Week: Not on file  . Minutes of Exercise per Session: Not on file  Stress:   . Feeling of Stress : Not on file  Social Connections:   . Frequency of Communication with Friends and Family: Not on file  . Frequency of Social Gatherings with Friends and Family: Not on file  . Attends Religious Services: Not on file  . Active Member of Clubs or Organizations: Not on file  . Attends Banker Meetings: Not on file  . Marital Status: Not on file  Intimate Partner Violence:   . Fear of Current or Ex-Partner: Not on file  . Emotionally Abused: Not on file  .  Physically Abused: Not on file  . Sexually Abused: Not on file    Past Surgical History:  Procedure Laterality Date  . TONSILLECTOMY    . WISDOM TOOTH EXTRACTION      Family History  Problem Relation Age of Onset  . Thyroid disease Mother   . Cancer Mother   . Hypertension Mother   . Healthy Father   . Healthy Sister     No Known Allergies  Current Outpatient Medications on File Prior to Visit  Medication Sig Dispense Refill  . albuterol (VENTOLIN HFA) 108 (90 Base) MCG/ACT inhaler Inhale 2 puffs into the lungs every 6 (six) hours as needed. 18 g 0  . cephALEXin (KEFLEX) 500 MG capsule Take 1 capsule (500 mg total) by mouth 2 (two) times daily. 20 capsule 0   . clonazePAM (KLONOPIN) 0.5 MG tablet 1 tab po bid prn severe anxiety/panic 3 tablet 0  . cyclobenzaprine (FLEXERIL) 5 MG tablet Take 1 tablet (5 mg total) by mouth at bedtime. 4 tablet 0  . fluticasone (FLONASE) 50 MCG/ACT nasal spray Place 2 sprays into both nostrils daily. 16 g 1  . benzonatate (TESSALON) 100 MG capsule Take 1 capsule (100 mg total) by mouth 3 (three) times daily as needed for cough. (Patient not taking: Reported on 01/25/2020) 30 capsule 0  . busPIRone (BUSPAR) 15 MG tablet Take 1 tablet (15 mg total) by mouth 2 (two) times daily. Fill 15 mg rx. (Patient not taking: Reported on 03/18/2020) 60 tablet 0  . hydrOXYzine (ATARAX/VISTARIL) 10 MG tablet Take 1 tablet (10 mg total) by mouth 3 (three) times daily as needed for anxiety. (Patient not taking: Reported on 01/25/2020) 6 tablet 0  . meclizine (ANTIVERT) 12.5 MG tablet Take 1 tablet (12.5 mg total) by mouth 3 (three) times daily as needed for dizziness. (Patient not taking: Reported on 03/10/2020) 30 tablet 0  . ondansetron (ZOFRAN ODT) 4 MG disintegrating tablet Take 1 tablet (4 mg total) by mouth every 8 (eight) hours as needed for nausea or vomiting. (Patient not taking: Reported on 03/10/2020) 20 tablet 0  . ondansetron (ZOFRAN ODT) 8 MG disintegrating tablet Take 1 tablet (8 mg total) by mouth every 8 (eight) hours as needed for nausea or vomiting. (Patient not taking: Reported on 03/10/2020) 20 tablet 0   No current facility-administered medications on file prior to visit.    BP (!) 135/95   Pulse 85   Resp 18   Ht 5\' 10"  (1.778 m)   Wt 269 lb 12.8 oz (122.4 kg)   SpO2 97%   BMI 38.71 kg/m       Objective:   Physical Exam   General Mental Status- Alert. General Appearance- Not in acute distress.   Skin General: Color- Normal Color. Moisture- Normal Moisture.  Neck Carotid Arteries- Normal color. Moisture- Normal Moisture. No carotid bruits. No JVD.  Chest and Lung Exam Auscultation: Breath  Sounds:-Normal.  Cardiovascular Auscultation:Rythm- Regular. Murmurs & Other Heart Sounds:Auscultation of the heart reveals- No Murmurs.  Abdomen Inspection:-Inspeection Normal. Palpation/Percussion:Note:No mass. Palpation and Percussion of the abdomen reveal- Non Tender, Non Distended + BS, no rebound or guarding.   Neurologic Cranial Nerve exam:- CN III-XII intact(No nystagmus), symmetric smile. Strength:- 5/5 equal and symmetric strength both upper and lower extremities.   Neck- mild enlarged submandibular lymph node. One anterior cervidal lymph nodes over small red area to skin.  heent/mouth- no lesions under tongue or buccal mucosa. Teeth appear  normal. No sinus pressure tm normal both sides.  Assessment & Plan:  You do have moderate to severe anxiety.  BuSpar did not help and most recently anxiety/panic feeling occurring at night.  You have done well with low-dose clonazepam 0.25 mg at night.  Use sparingly/only as needed.  Currently requiring only 4 nights a week.  Do think best to go ahead and put you on contract and that you get UDS.  Controlled med regulations discussed today.  Recent mild cough no enlargement in submandibular node region and in anterior cervical node region.  These seem to coincide with viral stomatitis type presentation and possible skin infection.  Will prescribe Keflex antibiotic.  Follow-up in 14 days.  If lymph node still enlarged then will get CBC and ultrasound of neck/soft tissue.  Hypertension on average in the 140/90 range.  Discussed benefit of potentially using metoprolol low-dose.  You are hesitant to use metoprolol.  Recommend that you research the medication and secondary benefit to help with anxiety.  Follow-up in 2 weeks or as needed.  [name], PA-C   Time spent with patient today was 43  minutes which consisted of chart review, discussing diagnosis, work up treatment and documentation.

## 2020-05-05 NOTE — Addendum Note (Signed)
Addended by: Gwenevere Abbot on: 05/05/2020 12:21 PM   Modules accepted: Orders

## 2020-05-05 NOTE — Patient Instructions (Addendum)
You do have moderate to severe anxiety.  BuSpar did not help and most recently anxiety/panic feeling occurring at night.  You have done well with low-dose clonazepam 0.25 mg at night.  Use sparingly/only as needed.  Currently requiring only 4 nights a week.  Do think best to go ahead and put you on contract and that you get UDS.  Controlled med regulations discussed today.  Recent mild cough no enlargement in submandibular node region and in anterior cervical node region.  These seem to coincide with viral stomatitis type presentation and possible skin infection.  Will prescribe Keflex antibiotic.  Follow-up in 14 days.  If lymph node still enlarged then will get CBC and ultrasound of neck/soft tissue.  Hypertension on average in the 140/90 range.  Discussed benefit of potentially using metoprolol low-dose.  You are hesitant to use metoprolol.  Recommend that you research the medication and secondary benefit to help with anxiety.  Follow-up in 2 weeks or as needed.

## 2020-05-06 LAB — DRUG MONITORING, PANEL 8 WITH CONFIRMATION, URINE
6 Acetylmorphine: NEGATIVE ng/mL (ref <10)
Alcohol Metabolites: NEGATIVE ng/mL
Amphetamines: NEGATIVE ng/mL (ref <500)
Benzodiazepines: NEGATIVE ng/mL (ref <100)
Buprenorphine, Urine: NEGATIVE ng/mL (ref <5)
Cocaine Metabolite: NEGATIVE ng/mL (ref <150)
Creatinine: 46.5 mg/dL
MDMA: NEGATIVE ng/mL (ref <500)
Marijuana Metabolite: NEGATIVE ng/mL (ref <20)
Opiates: NEGATIVE ng/mL (ref <100)
Oxidant: NEGATIVE ug/mL
Oxycodone: NEGATIVE ng/mL (ref <100)
pH: 6.9 (ref 4.5–9.0)

## 2020-05-06 LAB — DM TEMPLATE

## 2020-05-19 ENCOUNTER — Ambulatory Visit (INDEPENDENT_AMBULATORY_CARE_PROVIDER_SITE_OTHER): Payer: No Typology Code available for payment source | Admitting: Medical

## 2020-05-19 ENCOUNTER — Other Ambulatory Visit: Payer: Self-pay

## 2020-05-19 VITALS — BP 119/72 | HR 73 | Temp 98.2°F | Resp 18 | Ht 70.0 in | Wt 268.0 lb

## 2020-05-19 DIAGNOSIS — E669 Obesity, unspecified: Secondary | ICD-10-CM | POA: Diagnosis not present

## 2020-05-19 DIAGNOSIS — R591 Generalized enlarged lymph nodes: Secondary | ICD-10-CM

## 2020-05-19 DIAGNOSIS — F419 Anxiety disorder, unspecified: Secondary | ICD-10-CM | POA: Diagnosis not present

## 2020-05-19 NOTE — Progress Notes (Signed)
Subjective:    Patient ID: Tracy Rich, male    DOB: 1995/09/22, 24 y.o.   MRN: [id number]  HPI  Pt in for follow up.  Pt has hx of some anxiety. See last note. He usually has anxiety at night. Rx'd low dose clonazepam and using about 4 nights a week. Up to date on uds and contract.Pt states he tries not to use tablet. If anxiety causing insomnia then will take.  I gave and antiobiotic on last visit for below reason from avs below.  "Recent mild cough no enlargement in submandibular node region and in anterior cervical node region.  These seem to coincide with viral stomatitis type presentation and possible skin infection.  Will prescribe Keflex antibiotic.  Follow-up in 14 days.  If lymph node still enlarged then will get CBC and ultrasound of neck/soft tissue."  Since he thought lymph nodes was swollen I gave keflex. Pt stopping feeling neck and states when rechecked could not feel and has no pain. Pt did take the antibiotic.  Pt bp is better today than last time. Was 139/80. He was hesitant to take b blocker. Today bp is better. Pt states he has been checking bp and staying low. Low salt diet. Pt walks every night 1/2-1 mile.   Pt is obese. He wants to loose weight. Lowest he has been since high school was 250. Couple of years after high school weighted over 300. Has lost weight since then. He wants to eat healthy     Review of Systems  Constitutional: Negative for chills, diaphoresis and fever.  HENT: Negative for congestion and drooling.   Respiratory: Negative for cough, chest tightness, shortness of breath and wheezing.   Cardiovascular: Negative for chest pain and palpitations.  Gastrointestinal: Negative for abdominal distention, abdominal pain and nausea.  Musculoskeletal: Negative for back pain.  Psychiatric/Behavioral: Positive for sleep disturbance. Negative for agitation, confusion and suicidal ideas. The patient is nervous/anxious.        Anxiety and insomnia is  better with clonazepam.     Past Medical History:  Diagnosis Date   Reflux      Social History   Socioeconomic History   Marital status: Single    Spouse name: Not on file   Number of children: Not on file   Years of education: Not on file   Highest education level: Not on file  Occupational History   Not on file  Tobacco Use   Smoking status: Former Smoker   Smokeless tobacco: Never Used  Building services engineer Use: Never used  Substance and Sexual Activity   Alcohol use: No   Drug use: No   Sexual activity: Not on file  Other Topics Concern   Not on file  Social History Narrative   Caffeine- once every other day   Social Determinants of Health   Financial Resource Strain:    Difficulty of Paying Living Expenses: Not on file  Food Insecurity:    Worried About Programme researcher, broadcasting/film/video in the Last Year: Not on file   [employer] of Food in the Last Year: Not on file  Transportation Needs:    Lack of Transportation (Medical): Not on file   Lack of Transportation (Non-Medical): Not on file  Physical Activity:    Days of Exercise per Week: Not on file   Minutes of Exercise per Session: Not on file  Stress:    Feeling of Stress : Not on file  Social Connections:  Frequency of Communication with Friends and Family: Not on file   Frequency of Social Gatherings with Friends and Family: Not on file   Attends Religious Services: Not on file   Active Member of Clubs or Organizations: Not on file   Attends Banker Meetings: Not on file   Marital Status: Not on file  Intimate Partner Violence:    Fear of Current or Ex-Partner: Not on file   Emotionally Abused: Not on file   Physically Abused: Not on file   Sexually Abused: Not on file    Past Surgical History:  Procedure Laterality Date   TONSILLECTOMY     WISDOM TOOTH EXTRACTION      Family History  Problem Relation Age of Onset   Thyroid disease Mother    Cancer Mother      Hypertension Mother    Healthy Father    Healthy Sister     No Known Allergies  Current Outpatient Medications on File Prior to Visit  Medication Sig Dispense Refill   albuterol (VENTOLIN HFA) 108 (90 Base) MCG/ACT inhaler Inhale 2 puffs into the lungs every 6 (six) hours as needed. 18 g 0   cephALEXin (KEFLEX) 500 MG capsule Take 1 capsule (500 mg total) by mouth 2 (two) times daily. 20 capsule 0   clonazePAM (KLONOPIN) 0.25 MG disintegrating tablet Take 1 tablet (0.25 mg total) by mouth at bedtime. 30 tablet 0   cyclobenzaprine (FLEXERIL) 5 MG tablet Take 1 tablet (5 mg total) by mouth at bedtime. 4 tablet 0   fluticasone (FLONASE) 50 MCG/ACT nasal spray Place 2 sprays into both nostrils daily. 16 g 1   benzonatate (TESSALON) 100 MG capsule Take 1 capsule (100 mg total) by mouth 3 (three) times daily as needed for cough. (Patient not taking: Reported on 01/25/2020) 30 capsule 0   busPIRone (BUSPAR) 15 MG tablet Take 1 tablet (15 mg total) by mouth 2 (two) times daily. Fill 15 mg rx. (Patient not taking: Reported on 03/18/2020) 60 tablet 0   hydrOXYzine (ATARAX/VISTARIL) 10 MG tablet Take 1 tablet (10 mg total) by mouth 3 (three) times daily as needed for anxiety. (Patient not taking: Reported on 01/25/2020) 6 tablet 0   meclizine (ANTIVERT) 12.5 MG tablet Take 1 tablet (12.5 mg total) by mouth 3 (three) times daily as needed for dizziness. (Patient not taking: Reported on 03/10/2020) 30 tablet 0   ondansetron (ZOFRAN ODT) 4 MG disintegrating tablet Take 1 tablet (4 mg total) by mouth every 8 (eight) hours as needed for nausea or vomiting. (Patient not taking: Reported on 03/10/2020) 20 tablet 0   ondansetron (ZOFRAN ODT) 8 MG disintegrating tablet Take 1 tablet (8 mg total) by mouth every 8 (eight) hours as needed for nausea or vomiting. (Patient not taking: Reported on 03/10/2020) 20 tablet 0   No current facility-administered medications on file prior to visit.    BP 119/72     Pulse 73    Temp 98.2 F (36.8 C) (Oral)    Resp 18    Ht 5\' 10"  (1.778 m)    Wt 268 lb (121.6 kg)    SpO2 98%    BMI 38.45 kg/m       Objective:   Physical Exam  General Mental Status- Alert. General Appearance- Not in acute distress.   Skin General: Color- Normal Color. Moisture- Normal Moisture.  Neck No lymphadenopathy. No tenderness on palpation.  Chest and Lung Exam Auscultation: Breath Sounds:-Normal.  Cardiovascular Auscultation:Rythm- Regular. Murmurs & Other  Heart Sounds:Auscultation of the heart reveals- No Murmurs.  Abdomen Inspection:-Inspeection Normal. Palpation/Percussion:Note:No mass. Palpation and Percussion of the abdomen reveal- Non Tender, Non Distended + BS, no rebound or guarding.   Neurologic Cranial Nerve exam:- CN III-XII intact(No nystagmus), symmetric smile. Strength:- 5/5 equal and symmetric strength both upper and lower extremities.      Assessment & Plan:  Your anxiety is recently improved with intermittent use of clonazepam only at night.  Seems to be helping you sleep better on the nights when the anxiety is more severe.  Continue to encourage limited use of such as 4 nights a week as you are doing presently.  Rx advisement given.  Up-to-date on UDS and controlled med contract.  Your previous concern for a neck pain/potential lymph node enlargement is resolved.  Seem to resolve with use of Keflex antibiotic.  Since exam is completely normal today decided not to get CBC and ultrasound.  But if you have recurrence then let me know.  Your blood pressure is better today than it was on last time.  Continue low-salt diet and exercise.  No need presently for blood pressure medication.  History of obesity in had discussion on ways to lose weight.  Particularly encouraged to try weight watchers app.  Follow-up in 4 months for controlled medication visit.  Asked to give me an updat via MyChart e in 1 month regarding weight loss efforts.  Time  spent with patient today was  30 minutes which consisted of chart review, discussing diagnoses, work up, treatment, counseling on weight loss and documentation.

## 2020-05-19 NOTE — Patient Instructions (Signed)
Your anxiety is recently improved with intermittent use of clonazepam only at night.  Seems to be helping you sleep better on the nights when the anxiety is more severe.  Continue to encourage limited use of such as 4 nights a week as you are doing presently.  Rx advisement given.  Up-to-date on UDS and controlled med contract.  Your previous concern for a neck pain/potential lymph node enlargement is resolved.  Seem to resolve with use of Keflex antibiotic.  Since exam is completely normal today decided not to get CBC and ultrasound.  But if you have recurrence then let me know.  Your blood pressure is better today than it was on last time.  Continue low-salt diet and exercise.  No need presently for blood pressure medication.  History of obesity in had discussion on ways to lose weight.  Particularly encouraged to try weight watchers app.  Follow-up in 4 months for controlled medication visit.  Asked to give me an updat via MyChart e in 1 month regarding weight loss efforts.

## 2020-06-11 ENCOUNTER — Telehealth: Payer: Self-pay | Admitting: Medical

## 2020-06-11 ENCOUNTER — Other Ambulatory Visit: Payer: Self-pay | Admitting: Medical

## 2020-06-11 ENCOUNTER — Encounter: Payer: Self-pay | Admitting: Medical

## 2020-06-11 MED ORDER — CLONAZEPAM 0.25 MG PO TBDP: 0.2500 mg | ORAL_TABLET | Freq: Every day | ORAL | 0 refills | Status: DC

## 2020-06-11 MED FILL — CLONAZEPAM 0.25 MG TBDP: 0.25 | 6 days supply | Qty: 6 | Fill #0

## 2020-06-11 NOTE — Telephone Encounter (Signed)
6 tab limited rx of clonazepam sent pending follow up next week. See my chart message.

## 2020-06-11 NOTE — Telephone Encounter (Signed)
Requesting: clonazepam Contract:05/2020 UDS:05/2020 Last Visit:05/2020 Next Visit: n/a Last Refill:05/2020  Please Advise

## 2020-06-15 ENCOUNTER — Other Ambulatory Visit: Payer: Self-pay

## 2020-06-15 ENCOUNTER — Other Ambulatory Visit: Payer: Self-pay | Admitting: Medical

## 2020-06-15 ENCOUNTER — Encounter: Payer: Self-pay | Admitting: Medical

## 2020-06-15 ENCOUNTER — Telehealth (INDEPENDENT_AMBULATORY_CARE_PROVIDER_SITE_OTHER): Payer: No Typology Code available for payment source | Admitting: Medical

## 2020-06-15 DIAGNOSIS — F419 Anxiety disorder, unspecified: Secondary | ICD-10-CM | POA: Diagnosis not present

## 2020-06-15 MED ORDER — SERTRALINE HCL 25 MG PO TABS: 25.0000 mg | ORAL_TABLET | Freq: Every day | ORAL | 0 refills | Status: DC

## 2020-06-15 MED ORDER — CLONAZEPAM 0.5 MG PO TABS: ORAL_TABLET | ORAL | 0 refills | Status: DC

## 2020-06-15 MED FILL — SERTRALINE HCL 25 MG TABLET: 25 | 30 days supply | Qty: 30 | Fill #0

## 2020-06-15 MED FILL — clonazePAM 0.5 MG TABS: 0.5 | 10 days supply | Qty: 20 | Fill #0

## 2020-06-15 NOTE — Progress Notes (Signed)
° °  Subjective:    Patient ID: Tracy Rich, male    DOB: March 11, 1996, 24 y.o.   MRN: [id number]  HPI  Virtual Visit via Video Note  I connected with Tracy Rich on 06/15/20 at  1:20 PM EST by a video enabled telemedicine application and verified that I am speaking with the correct person using two identifiers.  Location: Patient: home Provider: office  Pt did not check vitals signs,   I discussed the limitations of evaluation and management by telemedicine and the availability of in person appointments. The patient expressed understanding and agreed to proceed.  History of Present Illness: Pt states that he is having anxiety and panic attacks through out the past week.   Pt not sure what is causing him anxiety.   Pt states he was in past taking clonazepam at night out but now having to take sometimes during the day. Stared out taking 0.25 mg but finding not adequeate.  Pt initially tried buspar and he states did not help.   Pt reports some fatigue as well. Cbc and cmp was normal.   Observations/Objective:General-no acute distress, pleasant, oriented. Lungs- on inspection lungs appear unlabored. Neck- no tracheal deviation or jvd on inspection. Neuro- gross motor function appears intact.  Assessment and Plan: History of recent increasing anxiety.  Using increased clonazepam to help.  So I do think it would be a good idea to use SSRI/sertraline 25mg  daily and use clonazepam as backup severe anxiety/panic attack.   Sertraline prescription sent to patient's pharmacy.  Also sent in new prescription of clonazepam 0.5 mg dose to use 1 to 2 tablets p.o. twice daily as needed anxiety.  Follow-up in 2 weeks.  We will modify controlled medication contract based on how much clonazepam he is using over the next couple weeks.  Also might  increase sertraline to 50 mg if necessary.  Follow Up Instructions:    I discussed the assessment and treatment plan with the patient. The  patient was provided an opportunity to ask questions and all were answered. The patient agreed with the plan and demonstrated an understanding of the instructions.   The patient was advised to call back or seek an in-person evaluation if the symptoms worsen or if the condition fails to improve as anticipated.  Time spent with patient today was  25  minutes which consisted of chart revdiew, discussing diagnosis, work up treatment and documentation.   , PA-C   Review of Systems     Objective:   Physical Exam        Assessment & Plan:

## 2020-06-15 NOTE — Patient Instructions (Signed)
History of recent increasing anxiety.  Using increased clonazepam to help.  So I do think it would be a good idea to use SSRI/sertraline 25mg  daily and use clonazepam as backup severe anxiety/panic attack.   Sertraline prescription sent to patient's pharmacy.  Also sent in new prescription of clonazepam 0.5 mg dose to use 1 to 2 tablets p.o. twice daily as needed anxiety.  Follow-up in 2 weeks.  We will modify controlled medication contract based on how much clonazepam he is using over the next couple weeks.  Also might  increase sertraline to 50 mg if necessary.

## 2020-07-01 ENCOUNTER — Other Ambulatory Visit: Payer: Self-pay

## 2020-07-01 ENCOUNTER — Ambulatory Visit: Payer: No Typology Code available for payment source | Admitting: Medical

## 2020-07-05 ENCOUNTER — Other Ambulatory Visit: Payer: Self-pay | Admitting: Medical

## 2020-07-05 ENCOUNTER — Other Ambulatory Visit: Payer: Self-pay

## 2020-07-05 ENCOUNTER — Ambulatory Visit (INDEPENDENT_AMBULATORY_CARE_PROVIDER_SITE_OTHER): Payer: No Typology Code available for payment source | Admitting: Medical

## 2020-07-05 VITALS — BP 128/69 | HR 63 | Resp 18 | Ht 70.0 in | Wt 272.0 lb

## 2020-07-05 DIAGNOSIS — F419 Anxiety disorder, unspecified: Secondary | ICD-10-CM

## 2020-07-05 MED ORDER — CLONAZEPAM 0.5 MG PO TABS: 0.5000 mg | ORAL_TABLET | Freq: Two times a day (BID) | ORAL | 0 refills | Status: DC | PRN

## 2020-07-05 MED FILL — clonazePAM 0.5 MG TABS: 0.5 | 30 days supply | Qty: 60 | Fill #0

## 2020-07-05 NOTE — Patient Instructions (Addendum)
Hx of anxiety and failed ssri sertraline and buspar. Did respond well to clonazepam.  Up to date on contact and uds.  Counseled on limiting use if not anxious. Rx advisement given. If needs higher dose or more frequency then refer to counseling and psychiatrist. Pt expressed understanding.  Follow up in 4 months or as needed

## 2020-07-05 NOTE — Progress Notes (Signed)
Subjective:    Patient ID: Tracy Rich, male    DOB: 02-26-1996, 25 y.o.   MRN: [id number]  HPI   Pt in for follow up.  Pt has some chronic daily anxiety. Pt states he did try sertraline. He states first day no side effects. 2nd day he felt like had hangover. He states just felt like he drank but does not drink alcohol 3rd and 4th worse.  Pt states buspar had no side effects but did not help at all.  Pt states clonazepam controlled anxiety and takes 0.5 mg twice a day. Pt signed controlled med contract and gave uds.  Pt states he lost his job at [employer]. Now working Garment/textile technologist at [employer].  Pt states has worked Garment/textile technologist and states much less stressful thatn [employer].  He does walk daily with his dogs. Walks 1.0 to 1.5 miles.   Review of Systems  Constitutional: Negative for chills, fatigue and fever.  Respiratory: Negative for cough, chest tightness and wheezing.   Cardiovascular: Negative for chest pain and palpitations.  Gastrointestinal: Negative for abdominal pain, nausea, rectal pain and vomiting.  Genitourinary: Negative for dysuria.  Musculoskeletal: Negative for back pain.  Skin: Negative for rash.  Neurological: Negative for dizziness, light-headedness and headaches.  Hematological: Negative for adenopathy. Does not bruise/bleed easily.  Psychiatric/Behavioral: Negative for behavioral problems, decreased concentration, dysphoric mood and suicidal ideas. The patient is nervous/anxious.     Past Medical History:  Diagnosis Date  . Reflux      Social History   Socioeconomic History  . Marital status: Single    Spouse name: Not on file  . Number of children: Not on file  . Years of education: Not on file  . Highest education level: Not on file  Occupational History  . Not on file  Tobacco Use  . Smoking status: Former Games developer  . Smokeless tobacco: Never Used  Vaping Use  . Vaping Use: Never used  Substance and Sexual Activity  .  Alcohol use: No  . Drug use: No  . Sexual activity: Not on file  Other Topics Concern  . Not on file  Social History Narrative   Caffeine- once every other day   Social Determinants of Health   Financial Resource Strain: Not on file  Food Insecurity: Not on file  Transportation Needs: Not on file  Physical Activity: Not on file  Stress: Not on file  Social Connections: Not on file  Intimate Partner Violence: Not on file    Past Surgical History:  Procedure Laterality Date  . TONSILLECTOMY    . WISDOM TOOTH EXTRACTION      Family History  Problem Relation Age of Onset  . Thyroid disease Mother   . Cancer Mother   . Hypertension Mother   . Healthy Father   . Healthy Sister     No Known Allergies  Current Outpatient Medications on File Prior to Visit  Medication Sig Dispense Refill  . albuterol (VENTOLIN HFA) 108 (90 Base) MCG/ACT inhaler Inhale 2 puffs into the lungs every 6 (six) hours as needed. 18 g 0  . cephALEXin (KEFLEX) 500 MG capsule Take 1 capsule (500 mg total) by mouth 2 (two) times daily. 20 capsule 0  . clonazePAM (KLONOPIN) 0.5 MG tablet 1/2-1 tab po bid prn anxiety 20 tablet 0  . cyclobenzaprine (FLEXERIL) 5 MG tablet Take 1 tablet (5 mg total) by mouth at bedtime. 4 tablet 0  . fluticasone (FLONASE) 50 MCG/ACT nasal  spray Place 2 sprays into both nostrils daily. 16 g 1  . sertraline (ZOLOFT) 25 MG tablet Take 1 tablet (25 mg total) by mouth daily. 30 tablet 0  . benzonatate (TESSALON) 100 MG capsule Take 1 capsule (100 mg total) by mouth 3 (three) times daily as needed for cough. (Patient not taking: No sig reported) 30 capsule 0  . busPIRone (BUSPAR) 15 MG tablet Take 1 tablet (15 mg total) by mouth 2 (two) times daily. Fill 15 mg rx. (Patient not taking: No sig reported) 60 tablet 0  . hydrOXYzine (ATARAX/VISTARIL) 10 MG tablet Take 1 tablet (10 mg total) by mouth 3 (three) times daily as needed for anxiety. (Patient not taking: No sig reported) 6 tablet  0  . meclizine (ANTIVERT) 12.5 MG tablet Take 1 tablet (12.5 mg total) by mouth 3 (three) times daily as needed for dizziness. (Patient not taking: No sig reported) 30 tablet 0  . ondansetron (ZOFRAN ODT) 4 MG disintegrating tablet Take 1 tablet (4 mg total) by mouth every 8 (eight) hours as needed for nausea or vomiting. (Patient not taking: No sig reported) 20 tablet 0  . ondansetron (ZOFRAN ODT) 8 MG disintegrating tablet Take 1 tablet (8 mg total) by mouth every 8 (eight) hours as needed for nausea or vomiting. (Patient not taking: No sig reported) 20 tablet 0   No current facility-administered medications on file prior to visit.    BP 128/69   Pulse 63   Resp 18   Ht 5\' 10"  (1.778 m)   Wt 272 lb (123.4 kg)   SpO2 97%   BMI 39.03 kg/m       Objective:   Physical Exam   General Mental Status- Alert. General Appearance- Not in acute distress.   Skin General: Color- Normal Color. Moisture- Normal Moisture.  Neck Carotid Arteries- Normal color. Moisture- Normal Moisture. No carotid bruits. No JVD.  Chest and Lung Exam Auscultation: Breath Sounds:-Normal.  Cardiovascular Auscultation:Rythm- Regular. Murmurs & Other Heart Sounds:Auscultation of the heart reveals- No Murmurs.  Abdomen Inspection:-Inspeection Normal. Palpation/Percussion:Note:No mass. Palpation and Percussion of the abdomen reveal- Non Tender, Non Distended + BS, no rebound or guarding.    Neurologic Cranial Nerve exam:- CN III-XII intact(No nystagmus), symmetric smile. Drift Test:- No drift. Romberg Exam:- Negative.  Heal to Toe Gait exam:-Normal. Finger to Nose:- Normal/Intact Strength:- 5/5 equal and symmetric strength both upper and lower extremities.     Assessment & Plan:  Hx of anxiety and failed ssri sertraline and buspar. Did respond well to clonazepam.  Up to date on contact and uds.  Counseled on limiting use if not anxious. Rx advisement given. If needs higher dose or more frequency  then refer to counseling and psychiatrist. Pt expressed understanding.  Follow up in 4 months or as needed  , [employer]

## 2020-07-22 ENCOUNTER — Ambulatory Visit: Payer: No Typology Code available for payment source | Admitting: Medical

## 2020-08-10 ENCOUNTER — Encounter: Payer: Self-pay | Admitting: Medical

## 2020-08-10 ENCOUNTER — Other Ambulatory Visit: Payer: Self-pay

## 2020-08-10 ENCOUNTER — Ambulatory Visit (INDEPENDENT_AMBULATORY_CARE_PROVIDER_SITE_OTHER): Payer: No Typology Code available for payment source | Admitting: Medical

## 2020-08-10 VITALS — BP 130/80 | HR 88 | Resp 18 | Ht 70.0 in | Wt 266.0 lb

## 2020-08-10 DIAGNOSIS — R002 Palpitations: Secondary | ICD-10-CM | POA: Diagnosis not present

## 2020-08-10 DIAGNOSIS — K219 Gastro-esophageal reflux disease without esophagitis: Secondary | ICD-10-CM | POA: Diagnosis not present

## 2020-08-10 DIAGNOSIS — F419 Anxiety disorder, unspecified: Secondary | ICD-10-CM | POA: Diagnosis not present

## 2020-08-10 DIAGNOSIS — R739 Hyperglycemia, unspecified: Secondary | ICD-10-CM | POA: Diagnosis not present

## 2020-08-10 LAB — TSH: TSH: 2.32 u[IU]/mL (ref 0.35–4.50)

## 2020-08-10 LAB — T4, FREE: Free T4: 0.8 ng/dL (ref 0.60–1.60)

## 2020-08-10 NOTE — Patient Instructions (Addendum)
You have history of palpitations. Advise continue to not use caffeine beverages. Will refer to cardiologist. You may get zio-patch. Also get tsh and t4 today. Today ekg normal sinus rhythm.  For anxiety particularly at night can use clonazepam 1/2-1 tab at night. This would be helpful to see if stops palpitation sensation quickly.  For hx of gerd would also recommend that you take famotadine daily until you see cardiologist.  For elevated sugar will get a1c today.  Follow up 14 days or as needed.

## 2020-08-10 NOTE — Progress Notes (Signed)
Subjective:    Patient ID: Tracy Rich, male    DOB: 31-Jan-1996, 25 y.o.   MRN: [id number]  HPI  Pt in stating about 2 weeks ago. He would feel some upper chest/suprasternal pain. Describes like palpitation in his throat. This makes him nervous and almost panic. But he states not really making him panic but is a concern. Not drinking a lot of caffeine beverages. Last soda was on Sunday.  Pt has history of anxiety in the past. He tends to get anxious in past. But he states would feel this more at night. He states sometimes fluttering sensation. Will fall asleep and wake up feeling normal.   Pt is taking at most 1/2 clonazpam in the past. He states trying to use sparingly and has not used in 10 days.   Today he has felt constant palpitation sensation since 9 pm. Pt states does not have pain.  Pt has failed buspar in past and sertraline caused him to feel hungover.    Pt does not have any belching or heart burn at night. Pt tried gas-x but did not help. Pt does have hx of gerd in past. Recently h2 blockers and does not make impact.  Pt is about to start program/comsmetology.   Review of Systems  Constitutional: Negative for chills, fatigue and fever.  Respiratory: Negative for cough, chest tightness, shortness of breath and wheezing.   Cardiovascular: Positive for palpitations. Negative for chest pain.  Gastrointestinal: Negative for abdominal pain.  Genitourinary: Negative for dysuria and flank pain.  Musculoskeletal: Negative for back pain.  Neurological: Negative for seizures, syncope, facial asymmetry, speech difficulty, weakness and light-headedness.  Hematological: Negative for adenopathy. Does not bruise/bleed easily.  Psychiatric/Behavioral: Negative for behavioral problems, confusion, sleep disturbance and suicidal ideas. The patient is nervous/anxious.     Past Medical History:  Diagnosis Date  . Reflux      Social History   Socioeconomic History  . Marital  status: Single    Spouse name: Not on file  . Number of children: Not on file  . Years of education: Not on file  . Highest education level: Not on file  Occupational History  . Not on file  Tobacco Use  . Smoking status: Former Games developer  . Smokeless tobacco: Never Used  Vaping Use  . Vaping Use: Never used  Substance and Sexual Activity  . Alcohol use: No  . Drug use: No  . Sexual activity: Not on file  Other Topics Concern  . Not on file  Social History Narrative   Caffeine- once every other day   Social Determinants of Health   Financial Resource Strain: Not on file  Food Insecurity: Not on file  Transportation Needs: Not on file  Physical Activity: Not on file  Stress: Not on file  Social Connections: Not on file  Intimate Partner Violence: Not on file    Past Surgical History:  Procedure Laterality Date  . TONSILLECTOMY    . WISDOM TOOTH EXTRACTION      Family History  Problem Relation Age of Onset  . Thyroid disease Mother   . Cancer Mother   . Hypertension Mother   . Healthy Father   . Healthy Sister     No Known Allergies  Current Outpatient Medications on File Prior to Visit  Medication Sig Dispense Refill  . albuterol (VENTOLIN HFA) 108 (90 Base) MCG/ACT inhaler Inhale 2 puffs into the lungs every 6 (six) hours as needed. 18 g 0  .  clonazePAM (KLONOPIN) 0.5 MG tablet 1/2-1 tab po bid prn anxiety 20 tablet 0  . clonazePAM (KLONOPIN) 0.5 MG tablet Take 1 tablet (0.5 mg total) by mouth 2 (two) times daily as needed for anxiety. 60 tablet 0  . cyclobenzaprine (FLEXERIL) 5 MG tablet Take 1 tablet (5 mg total) by mouth at bedtime. 4 tablet 0  . fluticasone (FLONASE) 50 MCG/ACT nasal spray Place 2 sprays into both nostrils daily. 16 g 1  . sertraline (ZOLOFT) 25 MG tablet Take 1 tablet (25 mg total) by mouth daily. 30 tablet 0  . benzonatate (TESSALON) 100 MG capsule Take 1 capsule (100 mg total) by mouth 3 (three) times daily as needed for cough. (Patient  not taking: No sig reported) 30 capsule 0  . busPIRone (BUSPAR) 15 MG tablet Take 1 tablet (15 mg total) by mouth 2 (two) times daily. Fill 15 mg rx. (Patient not taking: No sig reported) 60 tablet 0  . cephALEXin (KEFLEX) 500 MG capsule Take 1 capsule (500 mg total) by mouth 2 (two) times daily. (Patient not taking: Reported on 08/10/2020) 20 capsule 0  . hydrOXYzine (ATARAX/VISTARIL) 10 MG tablet Take 1 tablet (10 mg total) by mouth 3 (three) times daily as needed for anxiety. (Patient not taking: No sig reported) 6 tablet 0  . meclizine (ANTIVERT) 12.5 MG tablet Take 1 tablet (12.5 mg total) by mouth 3 (three) times daily as needed for dizziness. (Patient not taking: No sig reported) 30 tablet 0  . ondansetron (ZOFRAN ODT) 4 MG disintegrating tablet Take 1 tablet (4 mg total) by mouth every 8 (eight) hours as needed for nausea or vomiting. (Patient not taking: No sig reported) 20 tablet 0  . ondansetron (ZOFRAN ODT) 8 MG disintegrating tablet Take 1 tablet (8 mg total) by mouth every 8 (eight) hours as needed for nausea or vomiting. (Patient not taking: No sig reported) 20 tablet 0   No current facility-administered medications on file prior to visit.    BP (!) 145/80   Pulse 88   Resp 18   Ht 5\' 10"  (1.778 m)   Wt 266 lb (120.7 kg)   SpO2 98%   BMI 38.17 kg/m    Past Medical History:  Diagnosis Date  . Reflux      Social History   Socioeconomic History  . Marital status: Single    Spouse name: Not on file  . Number of children: Not on file  . Years of education: Not on file  . Highest education level: Not on file  Occupational History  . Not on file  Tobacco Use  . Smoking status: Former Games developer  . Smokeless tobacco: Never Used  Vaping Use  . Vaping Use: Never used  Substance and Sexual Activity  . Alcohol use: No  . Drug use: No  . Sexual activity: Not on file  Other Topics Concern  . Not on file  Social History Narrative   Caffeine- once every other day   Social  Determinants of Health   Financial Resource Strain: Not on file  Food Insecurity: Not on file  Transportation Needs: Not on file  Physical Activity: Not on file  Stress: Not on file  Social Connections: Not on file  Intimate Partner Violence: Not on file    Past Surgical History:  Procedure Laterality Date  . TONSILLECTOMY    . WISDOM TOOTH EXTRACTION      Family History  Problem Relation Age of Onset  . Thyroid disease Mother   .  Cancer Mother   . Hypertension Mother   . Healthy Father   . Healthy Sister     No Known Allergies  Current Outpatient Medications on File Prior to Visit  Medication Sig Dispense Refill  . albuterol (VENTOLIN HFA) 108 (90 Base) MCG/ACT inhaler Inhale 2 puffs into the lungs every 6 (six) hours as needed. 18 g 0  . clonazePAM (KLONOPIN) 0.5 MG tablet 1/2-1 tab po bid prn anxiety 20 tablet 0  . clonazePAM (KLONOPIN) 0.5 MG tablet Take 1 tablet (0.5 mg total) by mouth 2 (two) times daily as needed for anxiety. 60 tablet 0  . cyclobenzaprine (FLEXERIL) 5 MG tablet Take 1 tablet (5 mg total) by mouth at bedtime. 4 tablet 0  . fluticasone (FLONASE) 50 MCG/ACT nasal spray Place 2 sprays into both nostrils daily. 16 g 1  . sertraline (ZOLOFT) 25 MG tablet Take 1 tablet (25 mg total) by mouth daily. 30 tablet 0  . benzonatate (TESSALON) 100 MG capsule Take 1 capsule (100 mg total) by mouth 3 (three) times daily as needed for cough. (Patient not taking: No sig reported) 30 capsule 0  . busPIRone (BUSPAR) 15 MG tablet Take 1 tablet (15 mg total) by mouth 2 (two) times daily. Fill 15 mg rx. (Patient not taking: No sig reported) 60 tablet 0  . cephALEXin (KEFLEX) 500 MG capsule Take 1 capsule (500 mg total) by mouth 2 (two) times daily. (Patient not taking: Reported on 08/10/2020) 20 capsule 0  . hydrOXYzine (ATARAX/VISTARIL) 10 MG tablet Take 1 tablet (10 mg total) by mouth 3 (three) times daily as needed for anxiety. (Patient not taking: No sig reported) 6 tablet 0   . meclizine (ANTIVERT) 12.5 MG tablet Take 1 tablet (12.5 mg total) by mouth 3 (three) times daily as needed for dizziness. (Patient not taking: No sig reported) 30 tablet 0  . ondansetron (ZOFRAN ODT) 4 MG disintegrating tablet Take 1 tablet (4 mg total) by mouth every 8 (eight) hours as needed for nausea or vomiting. (Patient not taking: No sig reported) 20 tablet 0  . ondansetron (ZOFRAN ODT) 8 MG disintegrating tablet Take 1 tablet (8 mg total) by mouth every 8 (eight) hours as needed for nausea or vomiting. (Patient not taking: No sig reported) 20 tablet 0   No current facility-administered medications on file prior to visit.    BP (!) 145/80   Pulse 88   Resp 18   Ht 5\' 10"  (1.778 m)   Wt 266 lb (120.7 kg)   SpO2 98%   BMI 38.17 kg/m       Objective:   Physical Exam  General Mental Status- Alert. General Appearance- Not in acute distress.   Skin General: Color- Normal Color. Moisture- Normal Moisture.  Neck Carotid Arteries- Normal color. Moisture- Normal Moisture. No carotid bruits. No JVD.  Chest and Lung Exam Auscultation: Breath Sounds:-Normal.  Cardiovascular Auscultation:Rythm- Regular. Murmurs & Other Heart Sounds:Auscultation of the heart reveals- No Murmurs.  Abdomen Inspection:-Inspeection Normal. Palpation/Percussion:Note:No mass. Palpation and Percussion of the abdomen reveal- Non Tender, Non Distended + BS, no rebound or guarding.   Neurologic Cranial Nerve exam:- CN III-XII intact(No nystagmus), symmetric smile. Strength:- 5/5 equal and symmetric strength both upper and lower extremities.      Assessment & Plan:  You have history of palpitations. Advise continue to not use caffeine beverages. Will refer to cardiologist. You may get zio-patch. Also get tsh and t4 today.  Today ekg normal sinus rhythm.  For anxiety particularly at  night can use clonazepam 1/2-1 tab at night. This would be helpful to see if stops palpitation sensation  quickly.  For hx of gerd would also recommend that you take famotadine daily until you see cardiologist.  For elevated sugar will get a1c today.  Follow up 14 days or as needed.  Time spent with patient today was 40  minutes which consisted of chart revdiew, discussing diagnosis, work up, treatment, answered all questions and documentation.

## 2020-09-15 ENCOUNTER — Other Ambulatory Visit: Payer: Self-pay

## 2020-09-15 ENCOUNTER — Other Ambulatory Visit: Payer: Self-pay | Admitting: Medical

## 2020-09-15 ENCOUNTER — Ambulatory Visit (INDEPENDENT_AMBULATORY_CARE_PROVIDER_SITE_OTHER): Payer: No Typology Code available for payment source | Admitting: Medical

## 2020-09-15 ENCOUNTER — Encounter: Payer: Self-pay | Admitting: Medical

## 2020-09-15 VITALS — BP 139/92 | HR 83 | Temp 97.4°F | Resp 18 | Ht 70.0 in | Wt 263.2 lb

## 2020-09-15 DIAGNOSIS — F419 Anxiety disorder, unspecified: Secondary | ICD-10-CM

## 2020-09-15 DIAGNOSIS — I10 Essential (primary) hypertension: Secondary | ICD-10-CM

## 2020-09-15 MED ORDER — METOPROLOL SUCCINATE ER 25 MG PO TB24: 25.0000 mg | ORAL_TABLET | Freq: Every day | ORAL | 3 refills | Status: DC

## 2020-09-15 MED ORDER — FAMOTIDINE 20 MG PO TABS: 20.0000 mg | ORAL_TABLET | Freq: Two times a day (BID) | ORAL | 3 refills | Status: DC

## 2020-09-15 NOTE — Patient Instructions (Addendum)
With recent severe anxiety and panic attacks. Have not responded to various meds and had paradoxical reaction to clonazepam recently. Can still try if needed. Rx advisement.  Will prescribe low dose metoprolol since bp has been elevated and b blocker can help with anxiety. Check bp daily. Bring rading to office.  Referral to psychiatrist placed. Gave list. Please contact office and would recommend getting both psychiatry service and counseling at same location if possible.   For gerd rx famotadine  Follow up 2-3 weeks or as needed

## 2020-09-15 NOTE — Progress Notes (Signed)
Subjective:    Patient ID: Tracy Rich, male    DOB: 1996/06/09, 25 y.o.   MRN: [id number]  HPI  Pt in for follow up on anxiety. Pt states he is having panic attacks daily.  Pt having to use klonopin 0.5 mg twice daily. He would like to see pyschiatrist.  He states at least one panic attack a day. Pt states has not used klonopin in last 4 days. Reason being is that he states last time he used it seemed to actually anxiety worse.Describes more that med ma not have worked. But he did take full tab.  Pt bp has been elevated as well. Pt states during on panic attack his bp was 160/117. Took bp again 10 mintues later and bp was 140/100. Then 2 hours later bp was 137/98.   Pt in past tried buspar, sertraline and hydroxyzine. He indicates did not do well with this.       Review of Systems  Constitutional: Negative for chills, fatigue and fever.  Respiratory: Negative for cough, chest tightness, shortness of breath and wheezing.   Cardiovascular: Negative for chest pain and palpitations.  Gastrointestinal: Negative for abdominal pain, constipation, nausea and vomiting.  Musculoskeletal: Negative for back pain.  Skin: Negative for rash.  Neurological: Negative for dizziness, syncope, weakness, numbness and headaches.  Psychiatric/Behavioral: Negative for behavioral problems, decreased concentration, sleep disturbance and suicidal ideas. The patient is nervous/anxious.     Past Medical History:  Diagnosis Date  . Reflux      Social History   Socioeconomic History  . Marital status: Single    Spouse name: Not on file  . Number of children: Not on file  . Years of education: Not on file  . Highest education level: Not on file  Occupational History  . Not on file  Tobacco Use  . Smoking status: Former Games developer  . Smokeless tobacco: Never Used  Vaping Use  . Vaping Use: Never used  Substance and Sexual Activity  . Alcohol use: No  . Drug use: No  . Sexual activity: Not on  file  Other Topics Concern  . Not on file  Social History Narrative   Caffeine- once every other day   Social Determinants of Health   Financial Resource Strain: Not on file  Food Insecurity: Not on file  Transportation Needs: Not on file  Physical Activity: Not on file  Stress: Not on file  Social Connections: Not on file  Intimate Partner Violence: Not on file    Past Surgical History:  Procedure Laterality Date  . TONSILLECTOMY    . WISDOM TOOTH EXTRACTION      Family History  Problem Relation Age of Onset  . Thyroid disease Mother   . Cancer Mother   . Hypertension Mother   . Healthy Father   . Healthy Sister     No Known Allergies  Current Outpatient Medications on File Prior to Visit  Medication Sig Dispense Refill  . clonazePAM (KLONOPIN) 0.5 MG tablet 1/2-1 tab po bid prn anxiety 20 tablet 0  . albuterol (VENTOLIN HFA) 108 (90 Base) MCG/ACT inhaler Inhale 2 puffs into the lungs every 6 (six) hours as needed. (Patient not taking: Reported on 09/15/2020) 18 g 0  . benzonatate (TESSALON) 100 MG capsule Take 1 capsule (100 mg total) by mouth 3 (three) times daily as needed for cough. (Patient not taking: Reported on 09/15/2020) 30 capsule 0  . busPIRone (BUSPAR) 15 MG tablet Take 1 tablet (15 mg  total) by mouth 2 (two) times daily. Fill 15 mg rx. (Patient not taking: Reported on 09/15/2020) 60 tablet 0  . cephALEXin (KEFLEX) 500 MG capsule Take 1 capsule (500 mg total) by mouth 2 (two) times daily. (Patient not taking: Reported on 09/15/2020) 20 capsule 0  . clonazePAM (KLONOPIN) 0.5 MG tablet Take 1 tablet (0.5 mg total) by mouth 2 (two) times daily as needed for anxiety. (Patient not taking: Reported on 09/15/2020) 60 tablet 0  . cyclobenzaprine (FLEXERIL) 5 MG tablet Take 1 tablet (5 mg total) by mouth at bedtime. (Patient not taking: Reported on 09/15/2020) 4 tablet 0  . fluticasone (FLONASE) 50 MCG/ACT nasal spray Place 2 sprays into both nostrils daily. (Patient not  taking: Reported on 09/15/2020) 16 g 1  . hydrOXYzine (ATARAX/VISTARIL) 10 MG tablet Take 1 tablet (10 mg total) by mouth 3 (three) times daily as needed for anxiety. (Patient not taking: Reported on 09/15/2020) 6 tablet 0  . meclizine (ANTIVERT) 12.5 MG tablet Take 1 tablet (12.5 mg total) by mouth 3 (three) times daily as needed for dizziness. (Patient not taking: Reported on 09/15/2020) 30 tablet 0  . ondansetron (ZOFRAN ODT) 4 MG disintegrating tablet Take 1 tablet (4 mg total) by mouth every 8 (eight) hours as needed for nausea or vomiting. (Patient not taking: Reported on 09/15/2020) 20 tablet 0  . ondansetron (ZOFRAN ODT) 8 MG disintegrating tablet Take 1 tablet (8 mg total) by mouth every 8 (eight) hours as needed for nausea or vomiting. (Patient not taking: Reported on 09/15/2020) 20 tablet 0  . sertraline (ZOLOFT) 25 MG tablet Take 1 tablet (25 mg total) by mouth daily. (Patient not taking: Reported on 09/15/2020) 30 tablet 0   No current facility-administered medications on file prior to visit.    BP (!) 142/87 (BP Location: Left Arm, Patient Position: Sitting, Cuff Size: Normal)   Pulse 83   Temp (!) 97.4 F (36.3 C) (Oral)   Resp 18   Wt 263 lb 3.2 oz (119.4 kg)   SpO2 100%   BMI 37.77 kg/m       Objective:   Physical Exam  General Mental Status- Alert. General Appearance- Not in acute distress.   Skin General: Color- Normal Color. Moisture- Normal Moisture.  Neck Carotid Arteries- Normal color. Moisture- Normal Moisture. No carotid bruits. No JVD.  Chest and Lung Exam Auscultation: Breath Sounds:-Normal.  Cardiovascular Auscultation:Rythm- Regular. Murmurs & Other Heart Sounds:Auscultation of the heart reveals- No Murmurs.   Neurologic Cranial Nerve exam:- CN III-XII intact(No nystagmus), symmetric smile. Strength:- 5/5 equal and symmetric strength both upper and lower extremities.      Assessment & Plan:  With recent severe anxiety and panic attacks. Have  not responded to various meds and had paradoxical reaction to clonazepam recently. Can still try if needed.  Will prescribe low dose metoprolol since bp has been elevated and b blocker can help with anxiety.  Referral to psychiatrist placed. Gave list. Please contact office and would recommend getting both psychiatry service and counseling at same location if possible.  For gerd rx famotadine.  Follow up 2-3 weeks or as needed

## 2020-09-17 ENCOUNTER — Encounter: Payer: Self-pay | Admitting: Medical

## 2020-09-22 ENCOUNTER — Other Ambulatory Visit: Payer: Self-pay

## 2020-09-22 ENCOUNTER — Encounter: Payer: Self-pay | Admitting: Gastroenterology

## 2020-09-22 ENCOUNTER — Other Ambulatory Visit: Payer: Self-pay | Admitting: Medical

## 2020-09-22 ENCOUNTER — Ambulatory Visit (INDEPENDENT_AMBULATORY_CARE_PROVIDER_SITE_OTHER): Payer: No Typology Code available for payment source | Admitting: Medical

## 2020-09-22 ENCOUNTER — Encounter: Payer: Self-pay | Admitting: Medical

## 2020-09-22 VITALS — BP 127/79 | HR 97 | Resp 18 | Ht 70.0 in | Wt 254.0 lb

## 2020-09-22 DIAGNOSIS — F419 Anxiety disorder, unspecified: Secondary | ICD-10-CM | POA: Diagnosis not present

## 2020-09-22 DIAGNOSIS — K219 Gastro-esophageal reflux disease without esophagitis: Secondary | ICD-10-CM | POA: Diagnosis not present

## 2020-09-22 MED ORDER — OMEPRAZOLE 20 MG PO CPDR: 20.0000 mg | DELAYED_RELEASE_CAPSULE | Freq: Every day | ORAL | 3 refills | Status: DC

## 2020-09-22 MED FILL — OMEPRAZOLE 20 MG CAP: 20 | 30 days supply | Qty: 30 | Fill #0

## 2020-09-22 NOTE — Patient Instructions (Addendum)
For history of anxiety see psychiatrist. Specialist will need to decide on management as standard med option have caused side effects.   I do think you have reflux symptoms. Refer to gi. Continue famotadine and adding omeprazole.  If you have worse or changing symptoms let us know.    Side effect to b blocker. Dropped pulse excessively. So stopped med.  Follow up in 4-6 weeks or as needed

## 2020-09-22 NOTE — Progress Notes (Signed)
   Subjective:    Patient ID: Tracy Rich, male    DOB: 1996-04-15, 25 y.o.   MRN: [id number]  HPI  Pt in for follow up on anxiety. I had given b blocker as alternative treatment but his pulse was too low. Pt pulse dropped to 39 with med so stopped. Pulse increased now that med was stopeed. Side effects/reaction to ssri and benzodiapenes. Also failed hydroxyzine and buspar.   I have referred to psychiatrist. October 04, 2020 first appointment.  Pt also has some heart burn symptoms with eating. Will get  nausea and vomit at times. Pressure in upper chest that occurs consistently with eating.  Pt is on famotadine. Prior ekg in feb was normal. At that time had palpitations. Seems to have a lot symptoms with anxiety in past.   Pt states after eating face flushed, nausea and sometimes will vomit after eating. Pt states he has not been eating much.   On further discussion he states he feels like can only eat smoothies. Pt has had to seen gi intermittenly since age 26 yo. He mentions various egd in past.    Review of Systems  Constitutional: Negative for chills, fatigue and fever.  Respiratory: Negative for cough, chest tightness, shortness of breath and wheezing.   Cardiovascular: Negative for chest pain and palpitations.  Gastrointestinal: Positive for abdominal pain.  Genitourinary: Negative for dysuria.  Musculoskeletal: Negative for back pain, myalgias and neck pain.  Skin: Negative for rash.  Neurological: Negative for dizziness and headaches.  Hematological: Negative for adenopathy. Does not bruise/bleed easily.  Psychiatric/Behavioral: Negative for behavioral problems and sleep disturbance. The patient is nervous/anxious.        Objective:   Physical Exam  General Mental Status- Alert. General Appearance- Not in acute distress.   Skin General: Color- Normal Color. Moisture- Normal Moisture.  Neck Carotid Arteries- Normal color. Moisture- Normal Moisture. No carotid bruits. No  JVD.  Chest and Lung Exam Auscultation: Breath Sounds:-Normal.  Cardiovascular Auscultation:Rythm- Regular. Murmurs & Other Heart Sounds:Auscultation of the heart reveals- No Murmurs.  Abdomen Inspection:-Inspeection Normal. Palpation/Percussion:Note:No mass. Palpation and Percussion of the abdomen reveal- Non Tender, Non Distended + BS, no rebound or guarding.   Neurologic Cranial Nerve exam:- CN III-XII intact(No nystagmus), symmetric smile. Strength:- 5/5 equal and symmetric strength both upper and lower extremities.      Assessment & Plan:  For history of anxiety see psychiatrist. Specialist will need to decide on management as standard med option have caused side effects.   I do think you have reflux symptoms. Refer to gi. Continue famotadine and adding omeprazole.  If you have worse or changing symptoms let us know.  Side effect to b blocker. Dropped pulse excessively. So stopped. Med.  Follow up in 4-6 weeks or as needed

## 2020-09-23 ENCOUNTER — Ambulatory Visit (INDEPENDENT_AMBULATORY_CARE_PROVIDER_SITE_OTHER): Payer: No Typology Code available for payment source | Admitting: Gastroenterology

## 2020-09-23 ENCOUNTER — Encounter: Payer: Self-pay | Admitting: Gastroenterology

## 2020-09-23 ENCOUNTER — Other Ambulatory Visit: Payer: Self-pay | Admitting: Gastroenterology

## 2020-09-23 VITALS — BP 134/80 | HR 83 | Ht 72.0 in | Wt 257.5 lb

## 2020-09-23 DIAGNOSIS — R112 Nausea with vomiting, unspecified: Secondary | ICD-10-CM | POA: Diagnosis not present

## 2020-09-23 DIAGNOSIS — K219 Gastro-esophageal reflux disease without esophagitis: Secondary | ICD-10-CM | POA: Diagnosis not present

## 2020-09-23 DIAGNOSIS — R1310 Dysphagia, unspecified: Secondary | ICD-10-CM

## 2020-09-23 DIAGNOSIS — R17 Unspecified jaundice: Secondary | ICD-10-CM

## 2020-09-23 DIAGNOSIS — R12 Heartburn: Secondary | ICD-10-CM

## 2020-09-23 DIAGNOSIS — F411 Generalized anxiety disorder: Secondary | ICD-10-CM

## 2020-09-23 MED ORDER — OMEPRAZOLE 20 MG PO CPDR: 20.0000 mg | DELAYED_RELEASE_CAPSULE | Freq: Two times a day (BID) | ORAL | 3 refills | Status: DC

## 2020-09-23 NOTE — Progress Notes (Signed)
Chief Complaint: GERD, dysphagia, weight loss   Referring Provider:     [name], PA-C   HPI:     Tracy Rich is a 25 y.o. male with a history of anxiety referred to the Gastroenterology Clinic for evaluation of GERD, dysphagia, and reported weight loss.  Reports a longstanding history of reflux since childhood.  Reportedly followed with a Gastroenterologist from ages [age] for reflux and reported possible concern for Barrett's Esophagus.  Has been previously evaluated with esophagram's, but does not think he has had an EGD before.  Has been taking H2 blockers since childhood.  Index symptoms of heartburn, particularly postprandial.  Episodes of nausea and nonbloody emesis.  -Esophagram in 07/2004 was essentially normal. -CT abdomen/pelvis in 01/2020 unremarkable -CT head in 03/2020 without intracranial lesion -MRI brain in 03/2020 was normal  More recently he c/o solid food dysphagia and lower chest pressure.  No history of food impactions.  Because of his heightened anxiety with regards to the dysphagia, he has essentially stopped eating solid foods and mainly maintains a diet of vegetable and fruit smoothies.  Has subsequently lost nearly 20 pounds, previously weighing 280, now 257 lbs.  No night sweats, fever, chills.  No hematochezia or melena.  He does report a strong history of anxiety and panic attacks.  He does note that his UGI symptoms tend to induce panic attacks and vice versa.  He is scheduled to see a Psychiatrist early next month.  No previous GI records available for review today.  Separately, intermittently elevated TBili in the past, with otherwise normal liver enzymes.  No bilirubin fractionization.  No known family history of CRC, GI malignancy, liver disease, pancreatic disease, or IBD.   Past Medical History:  Diagnosis Date  . Anxiety   . Reflux      Past Surgical History:  Procedure Laterality Date  . TONSILLECTOMY    . WISDOM  TOOTH EXTRACTION     Family History  Problem Relation Age of Onset  . Thyroid disease Mother   . Cancer Mother   . Hypertension Mother   . Thyroid cancer Mother   . GER disease Mother   . Healthy Father   . GER disease Father   . Healthy Sister   . Esophageal cancer Neg Hx   . Colon cancer Neg Hx    Social History   Tobacco Use  . Smoking status: Former Games developer  . Smokeless tobacco: Never Used  Vaping Use  . Vaping Use: Never used  Substance Use Topics  . Alcohol use: No  . Drug use: No   Current Outpatient Medications  Medication Sig Dispense Refill  . famotidine (PEPCID) 20 MG tablet Take 1 tablet (20 mg total) by mouth 2 (two) times daily. (Patient taking differently: Take 20 mg by mouth as needed.) 60 tablet 3   No current facility-administered medications for this visit.   No Known Allergies   Review of Systems: All systems reviewed and negative except where noted in HPI.     Physical Exam:    Wt Readings from Last 3 Encounters:  09/23/20 257 lb 8 oz (116.8 kg)  09/22/20 254 lb (115.2 kg)  09/15/20 263 lb 3.2 oz (119.4 kg)    BP 134/80   Pulse 83   Ht 6' (1.829 m)   Wt 257 lb 8 oz (116.8 kg)   BMI 34.92 kg/m  Constitutional:  Pleasant, in no acute distress.  Psychiatric: Normal mood and affect. Behavior is normal. EENT: Pupils normal.  Conjunctivae are normal. No scleral icterus. Neck supple. No cervical LAD. Cardiovascular: Normal rate, regular rhythm. No edema Pulmonary/chest: Effort normal and breath sounds normal. No wheezing, rales or rhonchi. Abdominal: Soft, nondistended, nontender. Bowel sounds active throughout. There are no masses palpable. No hepatomegaly. Neurological: Alert and oriented to person place and time. Skin: Skin is warm and dry. No rashes noted.   ASSESSMENT AND PLAN;   1) Heartburn 2) Dysphagia 3) Nausea with nonbloody emesis 25 year old male with longstanding history of heartburn, and intermittent episodes of  nausea/vomiting.  Has been taking H2 RA for many years.  While he certainly could have GERD, he also has significant underlying anxiety and panic disorder.  Discussed the strong overlap between his UGI symptoms and underlying anxiety today.   -I recommended EGD to evaluate for erosive esophagitis and for evaluation of potential treatment of dysphagia, but he declined -Trial course of omeprazole 20 mg PO BID for diagnostic and therapeutic intent -Okay to resume Pepcid for breakthrough - Barium esophagram to evaluate for luminal/mucosal irregularity, obvious motility disorder, hiatal hernia, and refluxate  4) Anxiety -Has appointment with Psychiatry in a couple of weeks -As above, discussed overlapping symptomatology and underlying anxiety  5) Elevated bilirubin -Intermittent episodes of elevated bilirubin with otherwise normal alkaline phosphatase and liver enzymes.  High suspicion for Gilbert's -Repeat hepatic panel with bilirubin fractionization with next set of labs    Shellia Cleverly, DO, FACG  09/23/2020, 2:22 PM   Saguier, Ramon Dredge, PA-C

## 2020-09-23 NOTE — Patient Instructions (Signed)
If you are age 24 or older, your body mass index should be between 23-30. Your Body mass index is 34.92 kg/m. If this is out of the aforementioned range listed, please consider follow up with your Primary Care Provider.  If you are age 24 or younger, your body mass index should be between 19-25. Your Body mass index is 34.92 kg/m. If this is out of the aformentioned range listed, please consider follow up with your Primary Care Provider.    We have sent the following medications to your pharmacy for you to pick up at your convenience:  Prilosec twice a day  You have been scheduled for a Barium Esophogram at [facility] (1st floor of the hospital) on ________at ____. Please arrive 15 minutes prior to your appointment for registration. Make certain not to have anything to eat or drink 3 hours prior to your test. If you need to reschedule for any reason, please contact radiology at [phone] to do so. __________________________________________________________________ A barium swallow is an examination that concentrates on views of the esophagus. This tends to be a double contrast exam (barium and two liquids which, when combined, create a gas to distend the wall of the oesophagus) or single contrast (non-ionic iodine based). The study is usually tailored to your symptoms so a good history is essential. Attention is paid during the study to the form, structure and configuration of the esophagus, looking for functional disorders (such as aspiration, dysphagia, achalasia, motility and reflux) EXAMINATION You may be asked to change into a gown, depending on the type of swallow being performed. A radiologist and radiographer will perform the procedure. The radiologist will advise you of the type of contrast selected for your procedure and direct you during the exam. You will be asked to stand, sit or lie in several different positions and to hold a small amount of fluid in your mouth before  being asked to swallow while the imaging is performed .In some instances you may be asked to swallow barium coated marshmallows to assess the motility of a solid food bolus. The exam can be recorded as a digital or video fluoroscopy procedure. POST PROCEDURE It will take 1-2 days for the barium to pass through your system. To facilitate this, it is important, unless otherwise directed, to increase your fluids for the next 24-48hrs and to resume your normal diet.  This test typically takes about 30 minutes to perform. __________________________________________________________________________________   Bonita Quin will be contacted by [facility] in the next 2 days to arrange a DG esophagram.  The number on your caller ID will be [phone], please answer when they call.  If you have not heard from them in 2 days please call [phone] to schedule.    Due to recent changes in healthcare laws, you may see the results of your imaging and laboratory studies on MyChart before your provider has had a chance to review them.  We understand that in some cases there may be results that are confusing or concerning to you. Not all laboratory results come back in the same time frame and the provider may be waiting for multiple results in order to interpret others.  Please give [place] 48 hours in order for your provider to thoroughly review all the results before contacting the office for clarification of your results.   Thank you for choosing me and [facility].  Vito Cirigliano, D.O.

## 2020-10-06 ENCOUNTER — Other Ambulatory Visit: Payer: Self-pay

## 2020-10-06 ENCOUNTER — Ambulatory Visit (HOSPITAL_COMMUNITY)
Admission: RE | Admit: 2020-10-06 | Discharge: 2020-10-06 | Disposition: A | Discharge status: Home / Self Care | Payer: No Typology Code available for payment source | Source: Ambulatory Visit | Attending: Gastroenterology | Admitting: Gastroenterology

## 2020-10-06 DIAGNOSIS — K219 Gastro-esophageal reflux disease without esophagitis: Secondary | ICD-10-CM | POA: Diagnosis present

## 2020-10-06 DIAGNOSIS — R1310 Dysphagia, unspecified: Secondary | ICD-10-CM | POA: Diagnosis present

## 2020-10-07 ENCOUNTER — Encounter: Payer: Self-pay | Admitting: Medical

## 2020-10-07 NOTE — Telephone Encounter (Signed)
Requesting: klonopnin  Contract:05/2020 UDS:05/2020 Last Visit:08/2020 Next Visit:n/a Last Refill: 05/2020  Please Advise

## 2020-10-08 ENCOUNTER — Telehealth: Payer: Self-pay | Admitting: Medical

## 2020-10-08 ENCOUNTER — Other Ambulatory Visit (HOSPITAL_BASED_OUTPATIENT_CLINIC_OR_DEPARTMENT_OTHER): Payer: Self-pay

## 2020-10-08 MED ORDER — CLONAZEPAM 0.5 MG PO TABS
ORAL_TABLET | ORAL | 0 refills | Status: DC
  Filled 2020-10-08: qty 15, 30d supply, fill #0

## 2020-10-08 NOTE — Telephone Encounter (Signed)
Rx clonazepam sent to pt pharmacy. My chart message he sent indicated no reactio/side effect and had tried recently.

## 2020-10-11 ENCOUNTER — Other Ambulatory Visit (HOSPITAL_BASED_OUTPATIENT_CLINIC_OR_DEPARTMENT_OTHER): Payer: Self-pay

## 2020-10-12 ENCOUNTER — Other Ambulatory Visit (HOSPITAL_BASED_OUTPATIENT_CLINIC_OR_DEPARTMENT_OTHER): Payer: Self-pay

## 2020-10-25 ENCOUNTER — Other Ambulatory Visit: Payer: Self-pay

## 2020-10-25 ENCOUNTER — Emergency Department (INDEPENDENT_AMBULATORY_CARE_PROVIDER_SITE_OTHER)
Admission: EM | Admit: 2020-10-25 | Discharge: 2020-10-25 | Disposition: A | Discharge status: Home / Self Care | Payer: No Typology Code available for payment source | Source: Home / Self Care

## 2020-10-25 DIAGNOSIS — M549 Dorsalgia, unspecified: Secondary | ICD-10-CM

## 2020-10-25 DIAGNOSIS — R079 Chest pain, unspecified: Secondary | ICD-10-CM | POA: Diagnosis not present

## 2020-10-25 DIAGNOSIS — M79602 Pain in left arm: Secondary | ICD-10-CM

## 2020-10-25 DIAGNOSIS — R42 Dizziness and giddiness: Secondary | ICD-10-CM | POA: Diagnosis not present

## 2020-10-25 LAB — POCT FASTING CBG KUC MANUAL ENTRY: POCT Glucose (KUC): 113 mg/dL — AB (ref 70–99)

## 2020-10-25 NOTE — ED Triage Notes (Signed)
Patient presents to Urgent Care with complaints of left sided chest pain, non-radiating since about 90 minutes ago. Patient reports he has hx of severe anxiety, feels like the left side of his face is numb. Pt takes klonopin for anxiety, has not taken one in about a month.

## 2020-10-25 NOTE — ED Provider Notes (Signed)
[facility]    CSN: [id number] Arrival date & time: 10/25/20  1617      History   Chief Complaint Chief Complaint  Patient presents with  . Chest Pain    HPI Tracy Rich is a 25 y.o. male.   Reports that he has been experiencing left-sided chest pain.  States that the pain does not radiate, and has been going on for about the last hour and a half.  Reports that he also has history of severe anxiety, feels like the left side of his face is numb.  Patient has taken Klonopin in the past for anxiety, does not have any medication at this time.  Patient is followed by psychiatry as well as primary care.  Has had extensive work-up for chest pain in the past that was negative for cardiac etiology, pulmonary etiology.  Denies personal and family cardiac history.  Denies increased fatigue, diaphoresis, arm pain, jaw pain, radiating pain, shortness of breath, other symptoms.  There do not seem to be aggravating or alleviating factors.  ROS per HPI  The history is provided by the patient.    Past Medical History:  Diagnosis Date  . Anxiety   . Reflux     There are no problems to display for this patient.   Past Surgical History:  Procedure Laterality Date  . TONSILLECTOMY    . WISDOM TOOTH EXTRACTION         Home Medications    Prior to Admission medications   Medication Sig Start Date End Date Taking? Authorizing Provider  famotidine (PEPCID) 20 MG tablet TAKE 1 TABLET BY MOUTH TWICE DAILY Patient taking differently: Take 20 mg by mouth as needed. 09/15/20 09/15/21 Yes Saguier, Ramon Dredge, PA-C  clonazePAM (KLONOPIN) 0.5 MG tablet TAKE 1/2 TABLET (0.25 MG TOTAL) BY MOUTH AT BEDTIME. 10/08/20 04/06/21  Saguier, Ramon Dredge, PA-C  omeprazole (PRILOSEC) 20 MG capsule TAKE 1 CAPSULE (20 MG TOTAL) BY MOUTH 2 (TWO) TIMES DAILY BEFORE A MEAL. 09/23/20 09/23/21  Cirigliano, Vito V, DO  metoprolol succinate (TOPROL-XL) 25 MG 24 hr tablet Take 1 tablet (25 mg total) by mouth daily.  09/15/20 09/23/20  Saguier, Ramon Dredge, PA-C  sertraline (ZOLOFT) 25 MG tablet Take 1 tablet (25 mg total) by mouth daily. Patient not taking: No sig reported 06/15/20 09/23/20  Saguier, Ramon Dredge, PA-C    Family History Family History  Problem Relation Age of Onset  . Thyroid disease Mother   . Cancer Mother   . Hypertension Mother   . Thyroid cancer Mother   . GER disease Mother   . Healthy Father   . GER disease Father   . Healthy Sister   . Esophageal cancer Neg Hx   . Colon cancer Neg Hx     Social History Social History   Tobacco Use  . Smoking status: Former Games developer  . Smokeless tobacco: Never Used  Vaping Use  . Vaping Use: Never used  Substance Use Topics  . Alcohol use: No  . Drug use: No     Allergies   Patient has no known allergies.   Review of Systems Review of Systems   Physical Exam Triage Vital Signs ED Triage Vitals  Enc Vitals Group     BP 10/25/20 1628 (!) 137/97     Pulse Rate 10/25/20 1628 83     Resp 10/25/20 1628 17     Temp 10/25/20 1628 98 F (36.7 C)     Temp Source 10/25/20 1628 Oral  SpO2 10/25/20 1628 99 %     Weight --      Height --      Head Circumference --      Peak Flow --      Pain Score 10/25/20 1627 8     Pain Loc --      Pain Edu? --      Excl. in GC? --    No data found.  Updated Vital Signs BP (!) 137/97 (BP Location: Left Arm)   Pulse 83   Temp 98 F (36.7 C) (Oral)   Resp 17   SpO2 99%   Visual Acuity Right Eye Distance:   Left Eye Distance:   Bilateral Distance:    Right Eye Near:   Left Eye Near:    Bilateral Near:     Physical Exam Vitals and nursing note reviewed.  Constitutional:      General: He is not in acute distress.    Appearance: He is well-developed. He is obese. He is not ill-appearing, toxic-appearing or diaphoretic.  HENT:     Head: Normocephalic and atraumatic.  Eyes:     Conjunctiva/sclera: Conjunctivae normal.  Cardiovascular:     Rate and Rhythm: Normal rate and regular  rhythm.     Pulses:          Carotid pulses are 2+ on the right side and 2+ on the left side.      Radial pulses are 2+ on the right side and 2+ on the left side.       Dorsalis pedis pulses are 2+ on the right side and 2+ on the left side.     Heart sounds: Normal heart sounds. No murmur heard.   Pulmonary:     Effort: Pulmonary effort is normal. No respiratory distress.     Breath sounds: Normal breath sounds.  Abdominal:     Palpations: Abdomen is soft.     Tenderness: There is no abdominal tenderness.  Musculoskeletal:        General: Normal range of motion.     Cervical back: Normal range of motion and neck supple.  Skin:    General: Skin is warm and dry.     Capillary Refill: Capillary refill takes less than 2 seconds.  Neurological:     General: No focal deficit present.     Mental Status: He is alert and oriented to person, place, and time.  Psychiatric:        Mood and Affect: Mood normal.        Behavior: Behavior normal.      UC Treatments / Results  Labs (all labs ordered are listed, but only abnormal results are displayed) Labs Reviewed  POCT FASTING CBG [facility] MANUAL ENTRY - Abnormal; Notable for the following components:      Result Value   POCT Glucose ([facility]) 113 (*)    All other components within normal limits    EKG   Radiology No results found.  Procedures Procedures (including critical care time)  Medications Ordered in UC Medications - No data to display  Initial Impression / Assessment and Plan / UC Course  I have reviewed the triage vital signs and the nursing notes.  Pertinent labs & imaging results that were available during my care of the patient were reviewed by me and considered in my medical decision making (see chart for details).    Chest pain Dizziness Left arm pain Back pain  EKG in office today shows normal sinus rhythm  Blood sugar today was 113, nonfasting Low suspicion for ACS given normal EKG, no cardiac history, no  changes or loss of sensation or strength in extremities, heaviness in extremities Discussed with patient that this could likely be anxiety Discussed that if symptoms acutely worsen, if you lose consciousness, have trouble swallowing, trouble breathing, that you need to follow-up in the ER immediately Otherwise follow-up with PCP as scheduled  Final Clinical Impressions(s) / UC Diagnoses   Final diagnoses:  Chest pain, unspecified type  Dizziness and giddiness  Left arm pain  Other acute back pain     Discharge Instructions     Your EKG shows normal sinus rhythm today  Blood sugar today was   I think there is definitely some anxiety at play with your chest pain.  However, if pain does not improve, if dizziness persists as well, I would have you follow-up in the ER for further evaluation and treatment    ED Prescriptions    None     PDMP not reviewed this encounter.   Moshe Cipro, NP 10/30/20 [phone]

## 2020-10-25 NOTE — Discharge Instructions (Addendum)
Your EKG shows normal sinus rhythm today  Blood sugar today was   I think there is definitely some anxiety at play with your chest pain.  However, if pain does not improve, if dizziness persists as well, I would have you follow-up in the ER for further evaluation and treatment

## 2020-11-04 ENCOUNTER — Other Ambulatory Visit (HOSPITAL_BASED_OUTPATIENT_CLINIC_OR_DEPARTMENT_OTHER): Payer: Self-pay

## 2020-11-04 MED ORDER — ESCITALOPRAM OXALATE 10 MG PO TABS
ORAL_TABLET | Freq: Every day | ORAL | 1 refills | Status: DC
Start: 2020-11-04 — End: 2021-01-03
  Filled 2020-11-04: qty 30, 30d supply, fill #0

## 2020-11-08 ENCOUNTER — Other Ambulatory Visit (HOSPITAL_BASED_OUTPATIENT_CLINIC_OR_DEPARTMENT_OTHER): Payer: Self-pay

## 2020-11-08 ENCOUNTER — Other Ambulatory Visit: Payer: Self-pay | Admitting: Medical

## 2020-11-08 NOTE — Telephone Encounter (Addendum)
Requesting: klonopnin Contract:05/2020 UDS:05/2020 Last Visit:08/2020 Next Visit:n/a Last Refill:10/08/2020  Please Advise   Reviewed recent UC note. States he had recent anxiety and went to [facility] for evaluation.  That note states he is seeing me and psychiatrist. I had referred him to psychiatrist 2 months ago. Has he started seeing already? If so would ask him to notify psychiatrist for med management. Various medication that I have prescribed really have not helped.  Let me know what he says. I had last refilled clonazepam early April after he described that he thought it did not help.

## 2020-11-09 ENCOUNTER — Other Ambulatory Visit (HOSPITAL_BASED_OUTPATIENT_CLINIC_OR_DEPARTMENT_OTHER): Payer: Self-pay

## 2020-11-23 ENCOUNTER — Ambulatory Visit: Payer: No Typology Code available for payment source | Admitting: Medical

## 2020-11-25 ENCOUNTER — Ambulatory Visit: Payer: No Typology Code available for payment source | Admitting: Medical

## 2020-12-09 ENCOUNTER — Other Ambulatory Visit (HOSPITAL_BASED_OUTPATIENT_CLINIC_OR_DEPARTMENT_OTHER): Payer: Self-pay

## 2020-12-09 MED ORDER — HYDROXYZINE HCL 10 MG PO TABS
ORAL_TABLET | ORAL | 1 refills | Status: DC
Start: 2020-12-09 — End: 2021-01-03
  Filled 2020-12-09: qty 30, 15d supply, fill #0

## 2020-12-09 MED ORDER — CLONAZEPAM 0.5 MG PO TABS
ORAL_TABLET | ORAL | 0 refills | Status: DC
Start: 2020-12-09 — End: 2021-01-03
  Filled 2020-12-09: qty 15, 30d supply, fill #0

## 2020-12-23 ENCOUNTER — Other Ambulatory Visit (HOSPITAL_COMMUNITY): Payer: Self-pay

## 2021-01-03 ENCOUNTER — Encounter: Payer: Self-pay | Admitting: Emergency Medicine

## 2021-01-03 ENCOUNTER — Emergency Department (INDEPENDENT_AMBULATORY_CARE_PROVIDER_SITE_OTHER)
Admission: EM | Admit: 2021-01-03 | Discharge: 2021-01-03 | Disposition: A | Discharge status: Home / Self Care | Payer: No Typology Code available for payment source | Source: Home / Self Care

## 2021-01-03 ENCOUNTER — Other Ambulatory Visit: Payer: Self-pay

## 2021-01-03 DIAGNOSIS — L0291 Cutaneous abscess, unspecified: Secondary | ICD-10-CM | POA: Diagnosis not present

## 2021-01-03 DIAGNOSIS — L732 Hidradenitis suppurativa: Secondary | ICD-10-CM

## 2021-01-03 HISTORY — DX: Hidradenitis suppurativa: L73.2

## 2021-01-03 MED ORDER — IBUPROFEN 800 MG PO TABS
800.0000 mg | ORAL_TABLET | ORAL | Status: AC
  Administered 2021-01-03: 20:00:00 800 mg via ORAL

## 2021-01-03 NOTE — ED Triage Notes (Signed)
Cyst to right ear x 3 days  Pt has had one in the same spot 18 months ago & lanced  it at home  OTC - tylenol PM this am No COVID vaccine

## 2021-01-03 NOTE — Discharge Instructions (Addendum)
This area May continue to drain for a day or 2 Return as needed

## 2021-01-03 NOTE — ED Provider Notes (Signed)
[name]    CSN: [id number] Arrival date & time: 01/03/21  1927      History   Chief Complaint Chief Complaint  Patient presents with   Cyst    Right ear    HPI Tracy Rich is a 25 y.o. male.   HPI  Patient states he has a history of hidradenitis suppurativa.  He states that he has had abscesses in his groin.  Today he is here for an abscess in his right ear.  He states it is very painful.  He has a cyst in this area that has had to be drained previously.  He is currently not taking any of his medications.  Past Medical History:  Diagnosis Date   Anxiety    Reflux     Patient Active Problem List   Diagnosis Date Noted   Hydradenitis 01/03/2021    Past Surgical History:  Procedure Laterality Date   TONSILLECTOMY     WISDOM TOOTH EXTRACTION         Home Medications    Prior to Admission medications   Medication Sig Start Date End Date Taking? Authorizing Provider  metoprolol succinate (TOPROL-XL) 25 MG 24 hr tablet Take 1 tablet (25 mg total) by mouth daily. 09/15/20 09/23/20  Saguier, Ramon Dredge, PA-C  sertraline (ZOLOFT) 25 MG tablet Take 1 tablet (25 mg total) by mouth daily. Patient not taking: No sig reported 06/15/20 09/23/20  Saguier, Ramon Dredge, PA-C    Family History Family History  Problem Relation Age of Onset   Thyroid disease Mother    Cancer Mother    Hypertension Mother    Thyroid cancer Mother    GER disease Mother    Healthy Father    GER disease Father    Healthy Sister    Esophageal cancer Neg Hx    Colon cancer Neg Hx     Social History Social History   Tobacco Use   Smoking status: Former    Pack years: 0.00   Smokeless tobacco: Never  Vaping Use   Vaping Use: Never used  Substance Use Topics   Alcohol use: No   Drug use: No     Allergies   Patient has no known allergies.   Review of Systems Review of Systems See HPI  Physical Exam Triage Vital Signs ED Triage Vitals  Enc Vitals Group     BP  01/03/21 1936 (!) 127/91     Pulse Rate 01/03/21 1936 92     Resp 01/03/21 1936 16     Temp 01/03/21 1936 99 F (37.2 C)     Temp Source 01/03/21 1936 Oral     SpO2 01/03/21 1936 98 %     Weight 01/03/21 1939 250 lb (113.4 kg)     Height 01/03/21 1939 6' (1.829 m)     Head Circumference --      Peak Flow --      Pain Score 01/03/21 1938 6     Pain Loc --      Pain Edu? --      Excl. in GC? --    No data found.  Updated Vital Signs BP (!) 127/91 (BP Location: Right Arm)   Pulse 92   Temp 99 F (37.2 C) (Oral)   Resp 16   Ht 6' (1.829 m)   Wt 113.4 kg   SpO2 98%   BMI 33.91 kg/m      Physical Exam Constitutional:      General: He is  not in acute distress.    Appearance: He is well-developed.  HENT:     Head: Normocephalic and atraumatic.     Ears:   Eyes:     Conjunctiva/sclera: Conjunctivae normal.     Pupils: Pupils are equal, round, and reactive to light.  Cardiovascular:     Rate and Rhythm: Normal rate.  Pulmonary:     Effort: Pulmonary effort is normal. No respiratory distress.  Abdominal:     General: There is no distension.     Palpations: Abdomen is soft.  Musculoskeletal:        General: Normal range of motion.     Cervical back: Normal range of motion.  Skin:    General: Skin is warm and dry.  Neurological:     Mental Status: He is alert.     UC Treatments / Results  Labs (all labs ordered are listed, but only abnormal results are displayed) Labs Reviewed - No data to display  EKG   Radiology No results found.  Procedures Procedures (including critical care time)  Medications Ordered in UC Medications - No data to display  Initial Impression / Assessment and Plan / UC Course  I have reviewed the triage vital signs and the nursing notes.  Pertinent labs & imaging results that were available during my care of the patient were reviewed by me and considered in my medical decision making (see chart for details).     Return as  needed.  Discussed that after an abscess I&D antibiotics not indicated unless there is cellulitis Final Clinical Impressions(s) / UC Diagnoses   Final diagnoses:  Abscess     Discharge Instructions      This area May continue to drain for a day or 2 Return as needed   ED Prescriptions   None    PDMP not reviewed this encounter.   Eustace Moore, MD 01/03/21 2004

## 2021-01-04 ENCOUNTER — Other Ambulatory Visit (HOSPITAL_BASED_OUTPATIENT_CLINIC_OR_DEPARTMENT_OTHER): Payer: Self-pay

## 2021-01-04 ENCOUNTER — Telehealth: Payer: Self-pay | Admitting: Emergency Medicine

## 2021-01-04 MED ORDER — DOXYCYCLINE HYCLATE 100 MG PO CAPS
100.0000 mg | ORAL_CAPSULE | Freq: Two times a day (BID) | ORAL | 0 refills | Status: DC
  Filled 2021-01-04: qty 14, 7d supply, fill #0

## 2021-01-04 NOTE — Telephone Encounter (Signed)
Call from [facility] regarding r ear pain from cyst drained - pain has increased - called to see if he should start an antibiotic

## 2021-01-04 NOTE — Telephone Encounter (Signed)
Will call in antibiotics

## 2021-01-04 NOTE — Telephone Encounter (Signed)
Call back to [facility] to let him know doxycycline had been sent to [facility] - [facility] to start today, eat fist & take at the end of his meal. Pt verbalized an understanding

## 2021-01-06 ENCOUNTER — Ambulatory Visit (INDEPENDENT_AMBULATORY_CARE_PROVIDER_SITE_OTHER): Payer: No Typology Code available for payment source | Admitting: Medical

## 2021-01-06 ENCOUNTER — Encounter: Payer: Self-pay | Admitting: Medical

## 2021-01-06 ENCOUNTER — Ambulatory Visit: Payer: No Typology Code available for payment source | Admitting: Family Medicine

## 2021-01-06 ENCOUNTER — Other Ambulatory Visit: Payer: Self-pay

## 2021-01-06 VITALS — BP 139/87 | HR 84 | Resp 18 | Ht 72.0 in | Wt 250.2 lb

## 2021-01-06 DIAGNOSIS — R55 Syncope and collapse: Secondary | ICD-10-CM | POA: Diagnosis not present

## 2021-01-06 DIAGNOSIS — L0291 Cutaneous abscess, unspecified: Secondary | ICD-10-CM

## 2021-01-06 NOTE — Progress Notes (Signed)
Subjective:    Patient ID: Tracy Rich, male    DOB: Jul 31, 1995, 25 y.o.   MRN: [id number]  HPI Pt in for rt ear canal swollen area just at entry rt ear canal. Pt went to urgent care at [facility].  Hpi from UC visit.  "HPI   Patient states he has a history of hidradenitis suppurativa.  He states that he has had abscesses in his groin.  Today he is here for an abscess in his right ear.  He states it is very painful.  He has a cyst in this area that has had to be drained previously.   He is currently not taking any of his medications."   Dx given was abscess.  Pt had I and D. Done without lidocaine. Pt declined the lidocaine injection.   Pain level today 4/10. Pain less after doxycyline on Tuesday.  Pt tells me pain started on Friday and over 2 days got larger before he went to UC.   Review of Systems  Constitutional:  Negative for chills and fatigue.  HENT:  Positive for ear pain. Negative for congestion and mouth sores.   Respiratory:  Negative for cough and chest tightness.   Cardiovascular:  Negative for chest pain and palpitations.  Gastrointestinal:  Negative for abdominal pain.  Musculoskeletal:  Negative for back pain.    Past Medical History:  Diagnosis Date   Anxiety    Reflux      Social History   Socioeconomic History   Marital status: Single    Spouse name: Not on file   Number of children: Not on file   Years of education: Not on file   Highest education level: Not on file  Occupational History   Not on file  Tobacco Use   Smoking status: Former    Pack years: 0.00   Smokeless tobacco: Never  Vaping Use   Vaping Use: Never used  Substance and Sexual Activity   Alcohol use: No   Drug use: No   Sexual activity: Not on file  Other Topics Concern   Not on file  Social History Narrative   Caffeine- once every other day   Social Determinants of Health   Financial Resource Strain: Not on file  Food Insecurity: Not on file   Transportation Needs: Not on file  Physical Activity: Not on file  Stress: Not on file  Social Connections: Not on file  Intimate Partner Violence: Not on file    Past Surgical History:  Procedure Laterality Date   TONSILLECTOMY     WISDOM TOOTH EXTRACTION      Family History  Problem Relation Age of Onset   Thyroid disease Mother    Cancer Mother    Hypertension Mother    Thyroid cancer Mother    GER disease Mother    Healthy Father    GER disease Father    Healthy Sister    Esophageal cancer Neg Hx    Colon cancer Neg Hx     No Known Allergies  Current Outpatient Medications on File Prior to Visit  Medication Sig Dispense Refill   doxycycline (VIBRAMYCIN) 100 MG capsule Take 1 capsule (100 mg total) by mouth 2 (two) times daily. 14 capsule 0   [DISCONTINUED] metoprolol succinate (TOPROL-XL) 25 MG 24 hr tablet Take 1 tablet (25 mg total) by mouth daily. 30 tablet 3   [DISCONTINUED] sertraline (ZOLOFT) 25 MG tablet Take 1 tablet (25 mg total) by mouth daily. (Patient not taking: No  sig reported) 30 tablet 0   No current facility-administered medications on file prior to visit.    BP 139/87   Pulse 84   Resp 18   Ht 6' (1.829 m)   Wt 250 lb 3.2 oz (113.5 kg)   SpO2 99%   BMI 33.93 kg/m       Objective:   Physical Exam  General- No acute distress. Pleasant patient. Neck- Full range of motion, no jvd Lungs- Clear, even and unlabored. Heart- regular rate and rhythm. Neurologic- CNII- XII grossly intact.  Rt ear-entry to canal/floor of canal has 64mm x 4 mm raised area with appearnce of head to area but no fluctuant.      Assessment & Plan:   t appears that you had recent small abscess in the entry area to right ear canal versus infected sebaceous cyst.  By history given appears that small incision area done by urgent care closed.  Discussed treatment options.  At this point discussed repeat I&D, getting wound culture from potential discharge and staying on  doxycycline.  Explained benefits versus risk of procedure.  If not improving by early next week then would try to get you in with ENT MD.  Educated on procedure process.  Patient signed consent form.  Cleaned area with normal saline and Betadine swabs.  Used pain ease spray to anesthetize the area.  Used blade to incise/drain area.  Culture discharge done.  We will follow those results.  Continue doxycycline.  Note patient decided not to use lidocaine.  On previous I&D's done he stated he declined lidocaine.  Advised over the weekend he can apply warm salt water soaks to area and lightly compressed to try to get more discharge.  Follow-up this coming Monday or sooner if needed.  At that point we will decide if ENT referral needed.  Time spent with patient today was 45  minutes which consisted of chart review, discussing diagnosis, work up ,treatment and documentation.

## 2021-01-06 NOTE — Telephone Encounter (Signed)
Called patient and spoke with him and made him a same day appointment

## 2021-01-06 NOTE — Patient Instructions (Addendum)
It appears that you had recent small abscess in the entry area to right ear canal versus infected sebaceous cyst.  By history given appears that small incision area done by urgent care closed.  Discussed treatment options.  At this point discussed repeat I&D, getting wound culture from potential discharge and staying on doxycycline.  Explained benefits versus risk of procedure.  If not improving by early next week then would try to get you in with ENT MD.  Educated on procedure process.  Patient signed consent form.  Cleaned area with normal saline and Betadine swabs.  Used pain ease spray to anesthetize the area.  Used blade to incise/drain area.  Culture discharge done.  We will follow those results.  Continue doxycycline.  Note patient decided not to use lidocaine.  On previous I&D's done he stated he declined lidocaine.  Advised over the weekend he can apply warm salt water soaks to area and lightly compressed to try to get more discharge. After care measures explained.  Follow-up this coming Monday or sooner if needed.  At that point we will decide if ENT referral needed.  Near syncope after I and D. Had him lay on left side until symptoms passed and stable. Then let pt go. Educated on near syncope/vasovagal syncope. Pt assured felt well before leaving. Stood up and appeared stable. Ambulated well.

## 2021-01-07 ENCOUNTER — Ambulatory Visit: Payer: No Typology Code available for payment source | Admitting: Family

## 2021-01-09 LAB — WOUND CULTURE
MICRO NUMBER:: 12091760
SPECIMEN QUALITY:: ADEQUATE

## 2021-01-10 ENCOUNTER — Ambulatory Visit: Payer: No Typology Code available for payment source | Admitting: Medical

## 2021-01-10 ENCOUNTER — Other Ambulatory Visit (HOSPITAL_BASED_OUTPATIENT_CLINIC_OR_DEPARTMENT_OTHER): Payer: Self-pay

## 2021-01-10 ENCOUNTER — Other Ambulatory Visit: Payer: Self-pay

## 2021-01-10 ENCOUNTER — Ambulatory Visit (INDEPENDENT_AMBULATORY_CARE_PROVIDER_SITE_OTHER): Payer: No Typology Code available for payment source | Admitting: Medical

## 2021-01-10 VITALS — BP 119/84 | HR 99 | Resp 18 | Ht 73.0 in | Wt 245.6 lb

## 2021-01-10 DIAGNOSIS — G4700 Insomnia, unspecified: Secondary | ICD-10-CM

## 2021-01-10 DIAGNOSIS — L739 Follicular disorder, unspecified: Secondary | ICD-10-CM | POA: Diagnosis not present

## 2021-01-10 DIAGNOSIS — E669 Obesity, unspecified: Secondary | ICD-10-CM

## 2021-01-10 DIAGNOSIS — F419 Anxiety disorder, unspecified: Secondary | ICD-10-CM

## 2021-01-10 MED ORDER — DOXYCYCLINE HYCLATE 100 MG PO TABS
100.0000 mg | ORAL_TABLET | Freq: Two times a day (BID) | ORAL | 0 refills | Status: DC
  Filled 2021-01-10: qty 10, 5d supply, fill #0

## 2021-01-10 MED ORDER — CLONAZEPAM 0.5 MG PO TABS
0.5000 mg | ORAL_TABLET | Freq: Two times a day (BID) | ORAL | 0 refills | Status: DC | PRN
  Filled 2021-01-10: qty 30, 15d supply, fill #0

## 2021-01-10 MED ORDER — FAMOTIDINE 20 MG PO TABS
20.0000 mg | ORAL_TABLET | Freq: Two times a day (BID) | ORAL | 2 refills | Status: DC
  Filled 2021-01-10: qty 60, 30d supply, fill #0
  Filled 2021-11-04: qty 60, 30d supply, fill #1
  Filled 2021-12-08: qty 60, 30d supply, fill #2

## 2021-01-10 NOTE — Patient Instructions (Addendum)
For anxiety and insomnia will prescribe clonazepam 0.5 tab #30 to use if needed. Well see how often need refill. In past was on contract and will do uds again in November 2022. Also try to get on different shift to help improve sleep.   We discussed sleep apnea concerns. You are loosing wt and your blood pressure is well controlled. Consider referral to sleep specialist if you continue to sleep poorly on second shift or you find out you snore.   For left calf folliculitis rx doxycycline antibiotic. This may help you ear region looks a lot better and now mostly resolved. If area reoccurs let me know.  For gerd rx famotadine.  For obesity continue to try to loose weight as we discussed.  Follow up 1-2 months or as needed.

## 2021-01-10 NOTE — Progress Notes (Signed)
Subjective:    Patient ID: Tracy Rich, male    DOB: 06/23/1996, 25 y.o.   MRN: [id number]  HPI  Here for anxiety, insominia, obesity, gerd and folliculitis. In addition to discuss area of ear that recnenty had I and D.  I had given pt him doxycycline post I and D. Pt states he used doxycycline for 4 days until he lost the rx. He states he was massaging area and got colored dc. No no pain.  Pt states he is feeling anxious still. Pt had not responded to ssri type meds sertraline or lexapro. I had referred pt to psychiatrist but he states it has not helped much. Specialist tried lexapro 2nd time and did not help again. Pt states it is too expensive. She also prescribed clonzapam which does help when he gets panic attacks.    Pt states past 2 weeks has not sleeping well. Pt has concern for sleep apnea. Pt not aware if he snores. He lives alone so not aware if he snores. Pt states sleep issues have started since he started working 3rd shift at [employer] center. Tries to sleep 8:30-9 and wakes up around 2.    Recent flare of reflux symptoms and needs refill of famotadine.   Review of Systems  Constitutional:  Negative for chills, fatigue and fever.  HENT:  Negative for congestion, ear discharge and ear pain.   Respiratory:  Negative for cough, chest tightness, shortness of breath and wheezing.   Cardiovascular:  Negative for chest pain and palpitations.  Gastrointestinal:  Negative for abdominal pain.       Recent flare of reflux.  Musculoskeletal:  Negative for gait problem.  Skin:  Negative for rash.       Left calf follicultiis/  Neurological:  Negative for dizziness, speech difficulty, weakness and light-headedness.  Psychiatric/Behavioral:  Positive for sleep disturbance. Negative for suicidal ideas. The patient is nervous/anxious.     Past Medical History:  Diagnosis Date   Anxiety    Reflux      Social History   Socioeconomic History   Marital status: Single     Spouse name: Not on file   Number of children: Not on file   Years of education: Not on file   Highest education level: Not on file  Occupational History   Not on file  Tobacco Use   Smoking status: Former    Pack years: 0.00   Smokeless tobacco: Never  Vaping Use   Vaping Use: Never used  Substance and Sexual Activity   Alcohol use: No   Drug use: No   Sexual activity: Not on file  Other Topics Concern   Not on file  Social History Narrative   Caffeine- once every other day   Social Determinants of Health   Financial Resource Strain: Not on file  Food Insecurity: Not on file  Transportation Needs: Not on file  Physical Activity: Not on file  Stress: Not on file  Social Connections: Not on file  Intimate Partner Violence: Not on file    Past Surgical History:  Procedure Laterality Date   TONSILLECTOMY     WISDOM TOOTH EXTRACTION      Family History  Problem Relation Age of Onset   Thyroid disease Mother    Cancer Mother    Hypertension Mother    Thyroid cancer Mother    GER disease Mother    Healthy Father    GER disease Father    Healthy Sister  Esophageal cancer Neg Hx    Colon cancer Neg Hx     No Known Allergies  Current Outpatient Medications on File Prior to Visit  Medication Sig Dispense Refill   doxycycline (VIBRAMYCIN) 100 MG capsule Take 1 capsule (100 mg total) by mouth 2 (two) times daily. 14 capsule 0   [DISCONTINUED] metoprolol succinate (TOPROL-XL) 25 MG 24 hr tablet Take 1 tablet (25 mg total) by mouth daily. 30 tablet 3   [DISCONTINUED] sertraline (ZOLOFT) 25 MG tablet Take 1 tablet (25 mg total) by mouth daily. (Patient not taking: No sig reported) 30 tablet 0   No current facility-administered medications on file prior to visit.    BP 119/84   Pulse 99   Resp 18   Ht 6\' 1"  (1.854 m)   Wt 245 lb 9.6 oz (111.4 kg)   SpO2 99%   BMI 32.40 kg/m       Objective:   Physical Exam  General Mental Status- Alert. General  Appearance- Not in acute distress.   Skin General: Color- Normal Color. Moisture- Normal Moisture.  Neck Carotid Arteries- Normal color. Moisture- Normal Moisture. No carotid bruits. No JVD.  Chest and Lung Exam Auscultation: Breath Sounds:-Normal.  Cardiovascular Auscultation:Rythm- Regular. Murmurs & Other Heart Sounds:Auscultation of the heart reveals- No Murmurs.  Abdomen Inspection:-Inspeection Normal. Palpation/Percussion:Note:No mass. Palpation and Percussion of the abdomen reveal- Non Tender, Non Distended + BS, no rebound or guarding.   Neurologic Cranial Nerve exam:- CN III-XII intact(No nystagmus), symmetric smile. Strength:- 5/5 equal and symmetric strength both upper and lower extremities.   Rt ear- canal entry area region of abcess vs sebacious cyst now almost flattened out completely. No tenderness. No warmth.    Assessment & Plan:  For anxiety and insomnia will prescribe clonazepam 0.5 tab #30 to use if needed. Well see how often need refill. In past was on contract and will do uds again in November 2022. Also try to get on different shift to help improve sleep.   We discussed sleep apnea concerns. You are loosing wt and your blood pressure is well controlled. Consider referral to sleep specialist if you continue to sleep poorly on second shift or you find out you snore.   For left calf folliculitis rx doxycycline antibiotic. This may help you ear region looks a lot better and now mostly resolved. If area reoccurs let me know.  For gerd rx famotadine.  For obesity continue to try to loose weight as we discussed.  Follow up 1-2 months or as needed.  December 2022, PA-C    Time spent with patient today was  42 minutes which consisted of chart revdiew, discussing diagnosis, work up treatment and documentation.

## 2021-01-26 ENCOUNTER — Other Ambulatory Visit (HOSPITAL_BASED_OUTPATIENT_CLINIC_OR_DEPARTMENT_OTHER): Payer: Self-pay

## 2021-01-26 ENCOUNTER — Ambulatory Visit (INDEPENDENT_AMBULATORY_CARE_PROVIDER_SITE_OTHER): Payer: No Typology Code available for payment source | Admitting: Medical

## 2021-01-26 ENCOUNTER — Other Ambulatory Visit: Payer: Self-pay

## 2021-01-26 ENCOUNTER — Ambulatory Visit: Payer: No Typology Code available for payment source | Admitting: Medical

## 2021-01-26 VITALS — BP 134/76 | HR 100 | Resp 18 | Ht 73.0 in | Wt 249.0 lb

## 2021-01-26 DIAGNOSIS — F419 Anxiety disorder, unspecified: Secondary | ICD-10-CM

## 2021-01-26 MED ORDER — CLONAZEPAM 0.5 MG PO TABS
0.5000 mg | ORAL_TABLET | Freq: Two times a day (BID) | ORAL | 0 refills | Status: DC | PRN
  Filled 2021-01-26: qty 60, 30d supply, fill #0

## 2021-01-26 NOTE — Progress Notes (Signed)
Subjective:    Patient ID: Tracy Rich, male    DOB: 10-26-1995, 25 y.o.   MRN: [id number]  HPI  Pt states about 2 weekends ago he was very stressed as he lost very close friend. He was very stressed shortley after partner passed. Still having trouble dealing with loss.   During height of his anxiety his bp was 174/114. This was around time he had some transient chest pain.   Pt was taking clonazepam and his bp came down and his chest pain eased up.  Bp and chest pain now resolved since this weekend.   Feb 2022 ekg showed nsr.  For anxiety I had referred him to psychiatrist. He states they prescribed the same meds. Also states was very expensive.     Review of Systems  Constitutional:  Negative for chills, fatigue and fever.  Respiratory:  Negative for cough, chest tightness, shortness of breath and wheezing.   Cardiovascular:  Negative for chest pain and palpitations.  Gastrointestinal:  Negative for abdominal pain and anal bleeding.  Musculoskeletal:  Negative for back pain.  Neurological:  Negative for dizziness, seizures, weakness, numbness and headaches.  Hematological:  Negative for adenopathy. Does not bruise/bleed easily.  Psychiatric/Behavioral:  Negative for behavioral problems and confusion. The patient is not hyperactive.     Past Medical History:  Diagnosis Date   Anxiety    Reflux      Social History   Socioeconomic History   Marital status: Single    Spouse name: Not on file   Number of children: Not on file   Years of education: Not on file   Highest education level: Not on file  Occupational History   Not on file  Tobacco Use   Smoking status: Former   Smokeless tobacco: Never  Vaping Use   Vaping Use: Never used  Substance and Sexual Activity   Alcohol use: No   Drug use: No   Sexual activity: Not on file  Other Topics Concern   Not on file  Social History Narrative   Caffeine- once every other day   Social Determinants of Health    Financial Resource Strain: Not on file  Food Insecurity: Not on file  Transportation Needs: Not on file  Physical Activity: Not on file  Stress: Not on file  Social Connections: Not on file  Intimate Partner Violence: Not on file    Past Surgical History:  Procedure Laterality Date   TONSILLECTOMY     WISDOM TOOTH EXTRACTION      Family History  Problem Relation Age of Onset   Thyroid disease Mother    Cancer Mother    Hypertension Mother    Thyroid cancer Mother    GER disease Mother    Healthy Father    GER disease Father    Healthy Sister    Esophageal cancer Neg Hx    Colon cancer Neg Hx     No Known Allergies  Current Outpatient Medications on File Prior to Visit  Medication Sig Dispense Refill   clonazePAM (KLONOPIN) 0.5 MG tablet Take 1 tablet (0.5 mg total) by mouth 2 (two) times daily as needed for anxiety. 30 tablet 0   famotidine (PEPCID) 20 MG tablet Take 1 tablet (20 mg total) by mouth 2 (two) times daily. 60 tablet 2   doxycycline (VIBRA-TABS) 100 MG tablet Take 1 tablet (100 mg total) by mouth 2 (two) times daily. 10 tablet 0   [DISCONTINUED] metoprolol succinate (TOPROL-XL) 25 MG 24  hr tablet Take 1 tablet (25 mg total) by mouth daily. 30 tablet 3   [DISCONTINUED] sertraline (ZOLOFT) 25 MG tablet Take 1 tablet (25 mg total) by mouth daily. (Patient not taking: No sig reported) 30 tablet 0   No current facility-administered medications on file prior to visit.    BP 134/76   Pulse 100   Resp 18   Ht 6\' 1"  (1.854 m)   Wt 249 lb (112.9 kg)   SpO2 100%   BMI 32.85 kg/m       Objective:   Physical Exam  General Mental Status- Alert. General Appearance- Not in acute distress.   Skin General: Color- Normal Color. Moisture- Normal Moisture.  Neck Carotid Arteries- Normal color. Moisture- Normal Moisture. No carotid bruits. No JVD.  Chest and Lung Exam Auscultation: Breath Sounds:-Normal.  Cardiovascular Auscultation:Rythm-  Regular. Murmurs & Other Heart Sounds:Auscultation of the heart reveals- No Murmurs.  Abdomen Inspection:-Inspeection Normal. Palpation/Percussion:Note:No mass. Palpation and Percussion of the abdomen reveal- Non Tender, Non Distended + BS, no rebound or guarding.  Neurologic Cranial Nerve exam:- CN III-XII intact(No nystagmus), symmetric smile. Strength:- 5/5 equal and symmetric strength both upper and lower extremities.       Assessment & Plan:   You do have anxiety and depression. Describe more anxiety. Recent severe traumatic event/loosing partner. Will prescribe clonazepam rx. In past you have used sparingly. You may need to use more frequent in light of recent events. Would also want you to consider Behavioral health/counselor referral. You declined initially but if you change your mind let me know.  I do think bp elevation and transient chest pain related to recent high level anxiety.   If bp elevation present despite controlled anxiety or getting recurrent chest pain and no anxiety present then be seen here or UC.   Follow up in one month or as needed.  , PA-C

## 2021-01-26 NOTE — Patient Instructions (Signed)
You do have anxiety and depression. Describe more anxiety. Recent severe traumatic event/loosing partner. Will prescribe clonazepam rx. In past you have used sparingly. You may need to use more frequent in light of recent events. Would also want you to consider Behavioral health/counselor referral. You declined initially but if you change your mind let me know.  I do think bp elevation and transient chest pain related to recent high level anxiety.   If bp elevation present despite controlled anxiety or getting recurrent chest pain and no anxiety present then be seen here or UC.   Follow up in one month or as needed.

## 2021-02-24 ENCOUNTER — Other Ambulatory Visit (HOSPITAL_BASED_OUTPATIENT_CLINIC_OR_DEPARTMENT_OTHER): Payer: Self-pay

## 2021-02-24 ENCOUNTER — Telehealth: Payer: Self-pay | Admitting: Medical

## 2021-02-24 ENCOUNTER — Encounter: Payer: Self-pay | Admitting: Medical

## 2021-02-24 MED ORDER — CLONAZEPAM 0.5 MG PO TABS
0.5000 mg | ORAL_TABLET | Freq: Two times a day (BID) | ORAL | 0 refills | Status: DC | PRN
  Filled 2021-02-24: qty 30, 15d supply, fill #0

## 2021-02-24 NOTE — Telephone Encounter (Signed)
Rx clonazepam sent to pt pharmacy. 

## 2021-02-24 NOTE — Telephone Encounter (Signed)
Requesting: Clonazepam Contract: 05/05/2020 UDS: 05/05/2020 Last Visit: 01/26/21 Next Visit: none pending Last Refill: 01/26/21

## 2021-03-08 ENCOUNTER — Other Ambulatory Visit (HOSPITAL_BASED_OUTPATIENT_CLINIC_OR_DEPARTMENT_OTHER): Payer: Self-pay

## 2021-03-08 ENCOUNTER — Other Ambulatory Visit: Payer: Self-pay

## 2021-03-08 ENCOUNTER — Ambulatory Visit (INDEPENDENT_AMBULATORY_CARE_PROVIDER_SITE_OTHER): Payer: No Typology Code available for payment source | Admitting: Medical

## 2021-03-08 VITALS — BP 124/80 | HR 66 | Temp 97.9°F | Resp 18 | Ht 72.0 in | Wt 243.4 lb

## 2021-03-08 DIAGNOSIS — K5900 Constipation, unspecified: Secondary | ICD-10-CM

## 2021-03-08 DIAGNOSIS — F419 Anxiety disorder, unspecified: Secondary | ICD-10-CM

## 2021-03-08 MED ORDER — CLONAZEPAM 0.5 MG PO TABS
0.5000 mg | ORAL_TABLET | Freq: Two times a day (BID) | ORAL | 2 refills | Status: DC | PRN
  Filled 2021-03-08: qty 60, 30d supply, fill #0
  Filled 2021-04-06: qty 60, 30d supply, fill #1

## 2021-03-08 NOTE — Patient Instructions (Addendum)
For anxiety, I refilled your clonazepam. 0.5 mg twice daily. Rx advisement given. Up to date on contract and uds.    Your bp is well controlled today.  For constipation recommend high fiber diet, exercise and use miralax(or dulcolax). Use med every 3 day  if needed for constipation. Mag citrate is back up option. Explained back order.  Follow up 4 months or as needed.

## 2021-03-08 NOTE — Progress Notes (Signed)
Subjective:    Patient ID: Tracy Rich, male    DOB: 1995-11-11, 25 y.o.   MRN: [id number]  HPI  Pt inf for anxiety and depression. Pt is using clonazepam from 1 to 3 times a day. But some days will just use once a day. He states has not had panic attack in long time.   Pt did not do well with ssri in past. He states no depression but still feeling anxious   Pt did not do well with counseling in the past. I had referred pt to psychiatrist and pt indicated gave similar treatments to what I had given/rx'd.  He states psychiatrist did consider xanax rather than clonazepam.   Pt bp is well controlled.  Pt had recent constipation. Last bm few days ago. But not good bm in about one week. He tried miralax..  Pt does walk his dog daily. About mile a day. He is about to get membership at [employer].   Review of Systems  Constitutional:  Negative for chills, fatigue and fever.  HENT:  Negative for congestion and ear discharge.   Respiratory:  Negative for cough, chest tightness, shortness of breath and wheezing.   Cardiovascular:  Negative for chest pain and palpitations.  Gastrointestinal:  Negative for anal bleeding.  Endocrine: Negative for polydipsia, polyphagia and polyuria.  Genitourinary:  Negative for dysuria, flank pain and frequency.  Musculoskeletal:  Negative for back pain, joint swelling and neck pain.  Skin:  Negative for rash.     Past Medical History:  Diagnosis Date   Anxiety    Reflux      Social History   Socioeconomic History   Marital status: Single    Spouse name: Not on file   Number of children: Not on file   Years of education: Not on file   Highest education level: Not on file  Occupational History   Not on file  Tobacco Use   Smoking status: Former   Smokeless tobacco: Never  Vaping Use   Vaping Use: Never used  Substance and Sexual Activity   Alcohol use: No   Drug use: No   Sexual activity: Not on file  Other Topics Concern   Not on file   Social History Narrative   Caffeine- once every other day   Social Determinants of Health   Financial Resource Strain: Not on file  Food Insecurity: Not on file  Transportation Needs: Not on file  Physical Activity: Not on file  Stress: Not on file  Social Connections: Not on file  Intimate Partner Violence: Not on file    Past Surgical History:  Procedure Laterality Date   TONSILLECTOMY     WISDOM TOOTH EXTRACTION      Family History  Problem Relation Age of Onset   Thyroid disease Mother    Cancer Mother    Hypertension Mother    Thyroid cancer Mother    GER disease Mother    Healthy Father    GER disease Father    Healthy Sister    Esophageal cancer Neg Hx    Colon cancer Neg Hx     No Known Allergies  Current Outpatient Medications on File Prior to Visit  Medication Sig Dispense Refill   clonazePAM (KLONOPIN) 0.5 MG tablet Take 1 tablet (0.5 mg total) by mouth 2 (two) times daily as needed for anxiety. 30 tablet 0   famotidine (PEPCID) 20 MG tablet Take 1 tablet (20 mg total) by mouth 2 (two) times daily.  60 tablet 2   [DISCONTINUED] metoprolol succinate (TOPROL-XL) 25 MG 24 hr tablet Take 1 tablet (25 mg total) by mouth daily. 30 tablet 3   [DISCONTINUED] sertraline (ZOLOFT) 25 MG tablet Take 1 tablet (25 mg total) by mouth daily. (Patient not taking: No sig reported) 30 tablet 0   No current facility-administered medications on file prior to visit.    BP 124/80 (BP Location: Left Arm, Patient Position: Sitting, Cuff Size: Large)   Pulse 66   Temp 97.9 F (36.6 C) (Oral)   Resp 18   Ht 6' (1.829 m)   Wt 243 lb 6.4 oz (110.4 kg)   SpO2 99%   BMI 33.01 kg/m        Objective:   Physical Exam  General Mental Status- Alert. General Appearance- Not in acute distress.   Skin General: Color- Normal Color. Moisture- Normal Moisture.  Neck Carotid Arteries- Normal color. Moisture- Normal Moisture. No carotid bruits. No JVD.  Chest and Lung  Exam Auscultation: Breath Sounds:-Normal.  Cardiovascular Auscultation:Rythm- Regular. Murmurs & Other Heart Sounds:Auscultation of the heart reveals- No Murmurs.  Abdomen Inspection:-Inspeection Normal. Palpation/Percussion:Note:No mass. Palpation and Percussion of the abdomen reveal- Non Tender, Non Distended + BS, no rebound or guarding.   Neurologic Cranial Nerve exam:- CN III-XII intact(No nystagmus), symmetric smile. Strength:- 5/5 equal and symmetric strength both upper and lower extremities.       Assessment & Plan:   Patient Instructions  For anxiety, I refilled your clonazepam. 0.5 mg twice daily. Rx advisement given. Up to date on contract and uds.    Your bp is well controlled today.  For constipation recommend high fiber diet, exercise and use miralax(or dulcolax). Use med every 3 day  if needed for constipation. Mag citrate is back up option. Explained back order.  Follow up 4 months or as needed.    [name], PA-C

## 2021-03-08 NOTE — Addendum Note (Signed)
Addended by: [name], [name] on: 03/08/2021 02:53 PM   Modules accepted: Orders

## 2021-03-09 LAB — DRUG MONITORING, PANEL 8 WITH CONFIRMATION, URINE
6 Acetylmorphine: NEGATIVE ng/mL (ref <10)
Alcohol Metabolites: NEGATIVE ng/mL (ref <500)
Amphetamines: NEGATIVE ng/mL (ref <500)
Benzodiazepines: NEGATIVE ng/mL (ref <100)
Buprenorphine, Urine: NEGATIVE ng/mL (ref <5)
Cocaine Metabolite: NEGATIVE ng/mL (ref <150)
Creatinine: 22.9 mg/dL (ref >=20.0)
MDMA: NEGATIVE ng/mL (ref <500)
Marijuana Metabolite: NEGATIVE ng/mL (ref <20)
Opiates: NEGATIVE ng/mL (ref <100)
Oxidant: NEGATIVE ug/mL (ref <200)
Oxycodone: NEGATIVE ng/mL (ref <100)
pH: 6.7 (ref 4.5–9.0)

## 2021-03-09 LAB — DM TEMPLATE

## 2021-04-06 ENCOUNTER — Ambulatory Visit: Payer: No Typology Code available for payment source | Admitting: Medical

## 2021-04-06 ENCOUNTER — Other Ambulatory Visit (HOSPITAL_BASED_OUTPATIENT_CLINIC_OR_DEPARTMENT_OTHER): Payer: Self-pay

## 2021-04-11 ENCOUNTER — Ambulatory Visit: Payer: No Typology Code available for payment source | Admitting: Medical

## 2021-04-28 ENCOUNTER — Other Ambulatory Visit (HOSPITAL_BASED_OUTPATIENT_CLINIC_OR_DEPARTMENT_OTHER): Payer: Self-pay

## 2021-04-28 ENCOUNTER — Other Ambulatory Visit: Payer: Self-pay

## 2021-04-28 ENCOUNTER — Encounter: Payer: Self-pay | Admitting: Medical

## 2021-04-28 ENCOUNTER — Ambulatory Visit (INDEPENDENT_AMBULATORY_CARE_PROVIDER_SITE_OTHER): Payer: No Typology Code available for payment source | Admitting: Medical

## 2021-04-28 VITALS — BP 120/80 | HR 91 | Resp 18 | Ht 73.0 in | Wt 243.0 lb

## 2021-04-28 DIAGNOSIS — F419 Anxiety disorder, unspecified: Secondary | ICD-10-CM | POA: Diagnosis not present

## 2021-04-28 MED ORDER — CLONAZEPAM 1 MG PO TABS
1.0000 mg | ORAL_TABLET | Freq: Two times a day (BID) | ORAL | 0 refills | Status: DC | PRN
  Filled 2021-04-28: qty 60, 30d supply, fill #0

## 2021-04-28 NOTE — Progress Notes (Signed)
Subjective:    Patient ID: Tracy Rich, male    DOB: 05/05/1996, 25 y.o.   MRN: [id number]  HPI  Pt in for follow up.  Pt states that he just recently started a new job. Pt is Art therapist at [employer]. Pt states it was very stressful job which he had in the past. Pt is over 2 stores. He is working up to 84 hours a week.   Pt states he was having to take 2 of his 0.5 mg tabs twice daily when working. When not working he uses 0.5 mg  once a day.   Pt is out early due to increasing dose and frequency.   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  In the past 4  ssri, buspar and hydroxyzine helped. Also did not benefit from psyhiatrist and counselor without benefit per pt.    Review of Systems  Constitutional:  Negative for chills, fatigue and fever.  HENT:  Negative for congestion, drooling, ear pain, facial swelling, hearing loss, nosebleeds and postnasal drip.   Respiratory:  Negative for cough, chest tightness, shortness of breath and wheezing.   Cardiovascular:  Negative for chest pain and palpitations.  Gastrointestinal:  Negative for abdominal pain, constipation and nausea.  Genitourinary:  Negative for dysuria, flank pain, frequency and hematuria.  Musculoskeletal:  Negative for back pain, gait problem, joint swelling and neck pain.  Neurological:  Negative for dizziness, seizures, light-headedness and headaches.  Hematological:  Negative for adenopathy. Does not bruise/bleed easily.  Psychiatric/Behavioral:  Negative for behavioral problems, confusion, sleep disturbance and suicidal ideas. The patient is nervous/anxious.      Past Medical History:  Diagnosis Date   Anxiety    Reflux      Social History   Socioeconomic History   Marital status: Single    Spouse name: Not on file   Number of children: Not on file   Years of education: Not on file   Highest education level: Not on file   Occupational History   Not on file  Tobacco Use   Smoking status: Former   Smokeless tobacco: Never  Vaping Use   Vaping Use: Never used  Substance and Sexual Activity   Alcohol use: No   Drug use: No   Sexual activity: Not on file  Other Topics Concern   Not on file  Social History Narrative   Caffeine- once every other day   Social Determinants of Health   Financial Resource Strain: Not on file  Food Insecurity: Not on file  Transportation Needs: Not on file  Physical Activity: Not on file  Stress: Not on file  Social Connections: Not on file  Intimate Partner Violence: Not on file    Past Surgical History:  Procedure Laterality Date   TONSILLECTOMY     WISDOM TOOTH EXTRACTION      Family History  Problem Relation Age of Onset   Thyroid disease Mother    Cancer Mother    Hypertension Mother    Thyroid cancer Mother    GER disease Mother    Healthy Father    GER disease Father    Healthy Sister    Esophageal cancer Neg Hx    Colon cancer Neg Hx     No Known Allergies  Current Outpatient Medications on File Prior to Visit  Medication Sig Dispense Refill   clonazePAM (KLONOPIN) 0.5 MG tablet Take 1 tablet (0.5 mg  total) by mouth 2 (two) times daily as needed for anxiety. 60 tablet 2   famotidine (PEPCID) 20 MG tablet Take 1 tablet (20 mg total) by mouth 2 (two) times daily. 60 tablet 2   [DISCONTINUED] metoprolol succinate (TOPROL-XL) 25 MG 24 hr tablet Take 1 tablet (25 mg total) by mouth daily. 30 tablet 3   [DISCONTINUED] sertraline (ZOLOFT) 25 MG tablet Take 1 tablet (25 mg total) by mouth daily. (Patient not taking: No sig reported) 30 tablet 0   No current facility-administered medications on file prior to visit.    BP 140/89   Pulse 91   Resp 18   Ht 6\' 1"  (1.854 m)   Wt 243 lb (110.2 kg)   SpO2 100%   BMI 32.06 kg/m        Objective:   Physical Exam  General Mental Status- Alert. General Appearance- Not in acute distress.    Skin General: Color- Normal Color. Moisture- Normal Moisture.  Neck Carotid Arteries- Normal color. Moisture- Normal Moisture. No carotid bruits. No JVD.  Chest and Lung Exam Auscultation: Breath Sounds:-Normal.  Cardiovascular Auscultation:Rythm- Regular. Murmurs & Other Heart Sounds:Auscultation of the heart reveals- No Murmurs.  Neurologic Cranial Nerve exam:- CN III-XII intact(No nystagmus), symmetric smile. Strength:- 5/5 equal and symmetric strength both upper and lower extremities.     Assessment & Plan:   Patient Instructions  Anxiety recently increased with more stressful job.  Taking almost there is clonazepam 1 mg twice daily.  Unfortunately in the past you did not respond to 4 SSRIs, BuSpar and hydroxyzine.  Also psychiatrist and counseling was not beneficial per your report.  Will increase your clonazepam to 1 mg twice daily.  At this time updated contract.  Notified pharmacy to cancel your current refills on the lower dose.  Hopefully if you get promotion job will be less stressful and you can use lower dose.  Rx advisement given.  Follow-up in 4 months or sooner if needed.

## 2021-04-28 NOTE — Patient Instructions (Signed)
Anxiety recently increased with more stressful job.  Taking almost there is clonazepam 1 mg twice daily.  Unfortunately in the past you did not respond to 4 SSRIs, BuSpar and hydroxyzine.  Also psychiatrist and counseling was not beneficial per your report.  Will increase your clonazepam to 1 mg twice daily.  At this time updated contract.  Notified pharmacy to cancel your current refills on the lower dose.  Hopefully if you get promotion job will be less stressful and you can use lower dose.  Rx advisement given.  Follow-up in 4 months or sooner if needed.

## 2021-05-26 ENCOUNTER — Encounter: Payer: Self-pay | Admitting: Medical

## 2021-05-27 ENCOUNTER — Other Ambulatory Visit (HOSPITAL_BASED_OUTPATIENT_CLINIC_OR_DEPARTMENT_OTHER): Payer: Self-pay

## 2021-05-27 ENCOUNTER — Other Ambulatory Visit: Payer: Self-pay | Admitting: Medical

## 2021-05-30 ENCOUNTER — Other Ambulatory Visit: Payer: Self-pay

## 2021-05-30 ENCOUNTER — Other Ambulatory Visit (HOSPITAL_BASED_OUTPATIENT_CLINIC_OR_DEPARTMENT_OTHER): Payer: Self-pay

## 2021-05-30 ENCOUNTER — Emergency Department (INDEPENDENT_AMBULATORY_CARE_PROVIDER_SITE_OTHER)
Admission: EM | Admit: 2021-05-30 | Discharge: 2021-05-30 | Disposition: A | Discharge status: Home / Self Care | Payer: No Typology Code available for payment source | Source: Home / Self Care

## 2021-05-30 ENCOUNTER — Encounter: Payer: Self-pay | Admitting: Medical

## 2021-05-30 ENCOUNTER — Emergency Department: Admit: 2021-05-30 | Discharge status: Absent without leave | Payer: Self-pay

## 2021-05-30 DIAGNOSIS — R059 Cough, unspecified: Secondary | ICD-10-CM | POA: Diagnosis not present

## 2021-05-30 DIAGNOSIS — R509 Fever, unspecified: Secondary | ICD-10-CM | POA: Diagnosis not present

## 2021-05-30 LAB — POC INFLUENZA A AND B ANTIGEN (URGENT CARE ONLY)
Influenza A Ag: NEGATIVE
Influenza B Ag: NEGATIVE

## 2021-05-30 MED ORDER — CLONAZEPAM 1 MG PO TABS
1.0000 mg | ORAL_TABLET | Freq: Two times a day (BID) | ORAL | 0 refills | Status: DC | PRN
  Filled 2021-05-30: qty 60, 30d supply, fill #0

## 2021-05-30 MED ORDER — BENZONATATE 200 MG PO CAPS: 200.0000 mg | ORAL_CAPSULE | Freq: Three times a day (TID) | ORAL | 0 refills | Status: DC | PRN

## 2021-05-30 NOTE — Addendum Note (Signed)
Addended by: Gwenevere Abbot on: 05/30/2021 12:54 PM   Modules accepted: Orders

## 2021-05-30 NOTE — Telephone Encounter (Signed)
Requesting: klonopin  Contract:04/28/21 UDS:03/08/21 Last Visit:04/28/21 Next Visit:n/a Last Refill:04/28/21  Please Advise

## 2021-05-30 NOTE — ED Provider Notes (Signed)
[facility]    CSN: [id number] Arrival date & time: 05/30/21  1520      History   Chief Complaint Chief Complaint  Patient presents with   Chills   Generalized Body Aches   Nasal Congestion    HPI Tracy Rich is a 25 y.o. male.   HPI 25 year old male presents with congestion, body aches, headache, fever that began this morning.  Rapid influenza is negative.  Past Medical History:  Diagnosis Date   Anxiety    Reflux     Patient Active Problem List   Diagnosis Date Noted   Hydradenitis 01/03/2021    Past Surgical History:  Procedure Laterality Date   TONSILLECTOMY     WISDOM TOOTH EXTRACTION         Home Medications    Prior to Admission medications   Medication Sig Start Date End Date Taking? Authorizing Provider  benzonatate (TESSALON) 200 MG capsule Take 1 capsule (200 mg total) by mouth 3 (three) times daily as needed for up to 7 days for cough. 05/30/21 06/06/21 Yes Eliezer Lofts, FNP  clonazePAM (KLONOPIN) 1 MG tablet Take 1 tablet (1 mg total) by mouth 2 (two) times daily as needed for anxiety. 05/30/21   Saguier, Percell Miller, PA-C  famotidine (PEPCID) 20 MG tablet Take 1 tablet (20 mg total) by mouth 2 (two) times daily. Patient not taking: Reported on 05/30/2021 01/10/21   Saguier, Percell Miller, PA-C  metoprolol succinate (TOPROL-XL) 25 MG 24 hr tablet Take 1 tablet (25 mg total) by mouth daily. 09/15/20 09/23/20  Saguier, Percell Miller, PA-C  sertraline (ZOLOFT) 25 MG tablet Take 1 tablet (25 mg total) by mouth daily. Patient not taking: No sig reported 06/15/20 09/23/20  Saguier, Percell Miller, PA-C    Family History Family History  Problem Relation Age of Onset   Thyroid disease Mother    Cancer Mother    Hypertension Mother    Thyroid cancer Mother    GER disease Mother    Healthy Father    GER disease Father    Healthy Sister    Esophageal cancer Neg Hx    Colon cancer Neg Hx     Social History Social History   Tobacco Use   Smoking status:  Former   Smokeless tobacco: Never  Scientific laboratory technician Use: Never used  Substance Use Topics   Alcohol use: No   Drug use: No     Allergies   Patient has no known allergies.   Review of Systems Review of Systems  Constitutional:  Positive for chills and fever.  HENT:  Positive for congestion.   Musculoskeletal:  Positive for myalgias.  All other systems reviewed and are negative.   Physical Exam Triage Vital Signs ED Triage Vitals  Enc Vitals Group     BP 05/30/21 1534 129/86     Pulse Rate 05/30/21 1534 97     Resp 05/30/21 1534 14     Temp 05/30/21 1534 99.9 F (37.7 C)     Temp Source 05/30/21 1534 Oral     SpO2 05/30/21 1534 100 %     Weight --      Height --      Head Circumference --      Peak Flow --      Pain Score 05/30/21 1535 5     Pain Loc --      Pain Edu? --      Excl. in [place]? --    No data found.  Updated Vital Signs BP 129/86 (BP Location: Left Arm)   Pulse 97   Temp 98.9 F (37.2 C) (Oral)   Resp 14   SpO2 100%    Physical Exam Vitals and nursing note reviewed.  Constitutional:      Appearance: Normal appearance. He is normal weight.  HENT:     Head: Normocephalic and atraumatic.     Right Ear: Tympanic membrane, ear canal and external ear normal.     Left Ear: Tympanic membrane, ear canal and external ear normal.     Mouth/Throat:     Mouth: Mucous membranes are moist.     Pharynx: Oropharynx is clear.  Eyes:     Extraocular Movements: Extraocular movements intact.     Conjunctiva/sclera: Conjunctivae normal.     Pupils: Pupils are equal, round, and reactive to light.  Cardiovascular:     Rate and Rhythm: Normal rate and regular rhythm.     Pulses: Normal pulses.     Heart sounds: Normal heart sounds.  Pulmonary:     Effort: Pulmonary effort is normal.     Breath sounds: Normal breath sounds.  Musculoskeletal:        General: Normal range of motion.     Cervical back: Normal range of motion and neck supple.  Skin:     General: Skin is warm and dry.  Neurological:     General: No focal deficit present.     Mental Status: He is alert and oriented to person, place, and time.     UC Treatments / Results  Labs (all labs ordered are listed, but only abnormal results are displayed) Labs Reviewed  POC INFLUENZA A AND B ANTIGEN (URGENT CARE ONLY)    EKG   Radiology No results found.  Procedures Procedures (including critical care time)  Medications Ordered in UC Medications - No data to display  Initial Impression / Assessment and Plan / UC Course  I have reviewed the triage vital signs and the nursing notes.  Pertinent labs & imaging results that were available during my care of the patient were reviewed by me and considered in my medical decision making (see chart for details).     MDM 1.  Fever-Advised patient rapid influenza is negative today.  Advised patient may alternate between OTC Tylenol 1000 mg 1-2 times daily, as needed with OTC Ibuprofen 800 mg 1-2 times daily, as needed for fever and myalgias.2. Cough Rx'd-Tessalon Perles. Encouraged patient to increase daily water intake while taking these medications.  Work note provided prior to discharge per patient request. Final Clinical Impressions(s) / UC Diagnoses   Final diagnoses:  Fever, unspecified  Cough, unspecified type     Discharge Instructions      Advised patient rapid influenza is negative today.  Advised patient may alternate between OTC Tylenol 1000 mg 1-2 times daily, as needed with OTC Ibuprofen 800 mg 1-2 times daily, as needed for fever and myalgias.  Advised patient may take Tessalon Perles daily, as needed for cough.  Encouraged patient to increase daily water intake while taking these medications.  Work note provided prior to discharge per patient request.     ED Prescriptions     Medication Sig Dispense Auth. Provider   benzonatate (TESSALON) 200 MG capsule Take 1 capsule (200 mg total) by mouth 3 (three)  times daily as needed for up to 7 days for cough. 40 capsule Eliezer Lofts, FNP      PDMP not reviewed this encounter.   Lawrance Wiedemann,  Casimiro Needle, FNP 05/30/21 [phone]

## 2021-05-30 NOTE — Discharge Instructions (Addendum)
Advised patient rapid influenza is negative today.  Advised patient may alternate between OTC Tylenol 1000 mg 1-2 times daily, as needed with OTC Ibuprofen 800 mg 1-2 times daily, as needed for fever and myalgias.  Advised patient may take Tessalon Perles daily, as needed for cough.  Encouraged patient to increase daily water intake while taking these medications.  Work note provided prior to discharge per patient request.

## 2021-05-30 NOTE — ED Triage Notes (Signed)
Pt presents with congestion, body aches, HA, fever that began early this morning

## 2021-05-30 NOTE — Telephone Encounter (Signed)
Requesting: clonazepam 1mg  Contract: 04/28/2021 UDS: 03/08/2021 Last Visit: 04/28/2021 Next Visit: None Last Refill: 04/28/2021 #60 and 0RF  Please Advise

## 2021-06-01 ENCOUNTER — Telehealth: Payer: Self-pay | Admitting: Medical

## 2021-06-01 ENCOUNTER — Encounter: Payer: Self-pay | Admitting: Medical

## 2021-06-01 NOTE — Telephone Encounter (Signed)
Pt tested positive today for covid, and would like to know if a z-pack could be prescribed. No openings for the next 2 days. Please advise.   [id number] - [place], [place] - [address] AT [facility] [address]  [address], [place] [address]  Phone:  [phone]  Fax:  [phone]

## 2021-06-01 NOTE — Telephone Encounter (Signed)
I placed pt on Tracy Rich schedule for 12/1 .[name]   Pt has body aches and nasal congestion , anything he can take OTC until visit

## 2021-06-02 ENCOUNTER — Encounter: Payer: Self-pay | Admitting: Family Medicine

## 2021-06-02 ENCOUNTER — Telehealth (INDEPENDENT_AMBULATORY_CARE_PROVIDER_SITE_OTHER): Payer: No Typology Code available for payment source | Admitting: Family Medicine

## 2021-06-02 VITALS — Temp 99.0°F

## 2021-06-02 DIAGNOSIS — U071 COVID-19: Secondary | ICD-10-CM | POA: Diagnosis not present

## 2021-06-02 MED ORDER — MOLNUPIRAVIR EUA 200MG CAPSULE: 4.0000 | ORAL_CAPSULE | Freq: Two times a day (BID) | ORAL | 0 refills | Status: AC

## 2021-06-02 NOTE — Telephone Encounter (Signed)
Pt called and lvm to return call 

## 2021-06-02 NOTE — Telephone Encounter (Signed)
Patient was informed and stated he understood.

## 2021-06-02 NOTE — Progress Notes (Signed)
Virtual Visit via Video Note  I connected with Tracy Rich  on 06/02/21 at  5:00 PM EST by a video enabled telemedicine application and verified that I am speaking with the correct person using two identifiers.  Location patient: home, [place] Location provider:work or home office Persons participating in the virtual visit: patient, provider  I discussed the limitations of evaluation and management by telemedicine and the availability of in person appointments. The patient expressed understanding and agreed to proceed.   HPI:  Acute telemedicine visit for Covid19: -Onset: 2 days ago -he went to [facility] yesterday and reports they refused to test for covid and told him he had the flu -he did a covid test at home and it was positive -Symptoms include:sinus congestion, ears and sinuses are full, sinus discomfort, nausea, fevers on and off, felt a bit dizzy last night from anxiety -Denies: CP, SOB today - had some initially, vomiting, diarrhea, inability to tol oral intake -O2 98 -Has tried: tylenol -Pertinent past medical history: see below, reports history of obesity - reports weight was 300 in january -Pertinent medication allergies: No Known Allergies -COVID-19 vaccine status: There is no immunization history on file for this patient.  ROS: See pertinent positives and negatives per HPI.  Past Medical History:  Diagnosis Date   Anxiety    Reflux     Past Surgical History:  Procedure Laterality Date   TONSILLECTOMY     WISDOM TOOTH EXTRACTION       Current Outpatient Medications:    clonazePAM (KLONOPIN) 1 MG tablet, Take 1 tablet (1 mg total) by mouth 2 (two) times daily as needed for anxiety., Disp: 60 tablet, Rfl: 0   famotidine (PEPCID) 20 MG tablet, Take 1 tablet (20 mg total) by mouth 2 (two) times daily., Disp: 60 tablet, Rfl: 2   molnupiravir EUA (LAGEVRIO) 200 mg CAPS capsule, Take 4 capsules (800 mg total) by mouth 2 (two) times daily for 5 days., Disp: 40 capsule, Rfl:  0  EXAM:  VITALS per patient if applicable:  GENERAL: alert, oriented, appears well and in no acute distress  HEENT: atraumatic, conjunttiva clear, no obvious abnormalities on inspection of external nose and ears  NECK: normal movements of the head and neck  LUNGS: on inspection no signs of respiratory distress, breathing rate appears normal, no obvious gross SOB, gasping or wheezing  CV: no obvious cyanosis  MS: moves all visible extremities without noticeable abnormality  PSYCH/NEURO: pleasant and cooperative, no obvious depression or anxiety, speech and thought processing grossly intact  ASSESSMENT AND PLAN:  Discussed the following assessment and plan:  COVID-19   Discussed treatment options and risk of drug interactions, ideal treatment window, potential complications, isolation and precautions for COVID-19. After lengthy discussion, the patient opted for treatment with Legevrio due to being higher risk for complications of covid or severe disease and other factors. Denies chance of pregnancy in partners. Discussed EUA status of this drug and the fact that there is preliminary limited knowledge of risks/interactions/side effects per EUA document vs possible benefits and precautions. This information was shared with patient during the visit and also was provided in patient instructions. The patient declined referral for Covid outpatient treatment at this time.  Other symptomatic care measures summarized in patient instructions. Work/School slipped offered: declined Advised to seek prompt in person care if worsening, new symptoms arise, or if is not improving with treatment. Discussed options for inperson care if PCP office not available. Did let this patient know that I only  do telemedicine on Tuesdays and Thursdays for [place]. Advised to schedule follow up visit with PCP or [facility] if any further questions or concerns to avoid delays in care.   I discussed the assessment and treatment  plan with the patient. The patient was provided an opportunity to ask questions and all were answered. The patient agreed with the plan and demonstrated an understanding of the instructions.     Terressa Koyanagi, DO

## 2021-06-02 NOTE — Patient Instructions (Addendum)
HOME CARE TIPS:  -I sent the medication(s) we discussed to your pharmacy: Meds ordered this encounter  Medications   molnupiravir EUA (LAGEVRIO) 200 mg CAPS capsule    Sig: Take 4 capsules (800 mg total) by mouth 2 (two) times daily for 5 days.    Dispense:  40 capsule    Refill:  0     -I sent in the [place] treatment or referral you requested per our discussion. Please see the information provided below and discuss further with the pharmacist/treatment team.   -there is a chance of rebound illness after finishing your treatment. If you become sick again please isolate for an additional 5 days, plus 5 more days of masking.   -can use aleve if needed for fevers, aches and pains per instructions  -can use nasal saline a few times per day if you have nasal congestion  -can use afrin for 3-4 days  if needed  -stay hydrated, drink plenty of fluids and eat small healthy meals - avoid dairy  -can take 1000 IU (67mg) Vit D3 and 100-500 mg of Vit C daily per instructions  -If the Covid test is positive, check out the [facility] for more information on home care, transmission and treatment for COVID19  -follow up with your doctor in 2-3 days unless improving and feeling better  -stay home while sick, except to seek medical care. If you have COVID19, ideally it would be best to stay home for a full 10 days since the onset of symptoms PLUS one day of no fever and feeling better. Wear a good mask that fits snugly (such as N95 or KN95) if around others to reduce the risk of transmission.  It was nice to meet you today, and I really hope you are feeling better soon. I help [facility] out with telemedicine visits on Tuesdays and Thursdays and am available for visits on those days. If you have any concerns or questions following this visit please schedule a follow up visit with your Primary Care doctor or seek care at a local urgent care clinic to avoid delays in care.    Seek in person care or  schedule a follow up video visit promptly if your symptoms worsen, new concerns arise or you are not improving with treatment. Call 911 and/or seek emergency care if your symptoms are severe or life threatening.    Fact Sheet for Patients And Caregivers Emergency Use Authorization (EUA) Of LAGEVRIOT (molnupiravir) capsules For Coronavirus Disease 2019 (COVID-19)  What is the most important information I should know about LAGEVRIO? LAGEVRIO may cause serious side effects, including: ? LAGEVRIO may cause harm to your unborn baby. It is not known if LAGEVRIO will harm your baby if you take LAGEVRIO during pregnancy. o LAGEVRIO is not recommended for use in pregnancy. o LAGEVRIO has not been studied in pregnancy. LAGEVRIO was studied in pregnant animals only. When LAGEVRIO was given to pregnant animals, LAGEVRIO caused harm to their unborn babies. o You and your healthcare provider may decide that you should take LAGEVRIO during pregnancy if there are no other COVID-19 treatment options approved or authorized by the FDA that are accessible or clinically appropriate for you. o If you and your healthcare provider decide that you should take LAGEVRIO during pregnancy, you and your healthcare provider should discuss the known and potential benefits and the potential risks of taking LAGEVRIO during pregnancy. For individuals who are able to become pregnant: ? You should use a reliable method of birth  control (contraception) consistently and correctly during treatment with LAGEVRIO and for 4 days after the last dose of LAGEVRIO. Talk to your healthcare provider about reliable birth control methods. ? Before starting treatment with [facility] your healthcare provider may do a pregnancy test to see if you are pregnant before starting treatment with LAGEVRIO. ? Tell your healthcare provider right away if you become pregnant or think you may be pregnant during treatment with LAGEVRIO. Pregnancy  Surveillance Program: ? There is a pregnancy surveillance program for individuals who take LAGEVRIO during pregnancy. The purpose of this program is to collect information about the health of you and your baby. Talk to your healthcare provider about how to take part in this program. ? If you take LAGEVRIO during pregnancy and you agree to participate in the pregnancy surveillance program and allow your healthcare provider to share your information with [place], then your healthcare provider will report your use of [place] during pregnancy to [place]. by calling [phone] or PeacefulBlog.es. For individuals who are sexually active with partners who are able to become pregnant: ? It is not known if LAGEVRIO can affect sperm. While the risk is regarded as low, animal studies to fully assess the potential for LAGEVRIO to affect the babies of males treated with LAGEVRIO have not been completed. A reliable method of birth control (contraception) should be used consistently and correctly during treatment with LAGEVRIO and for at least 3 months after the last dose. The risk to sperm beyond 3 months is not known. Studies to understand the risk to sperm beyond 3 months are ongoing. Talk to your healthcare provider about reliable birth control methods. Talk to your healthcare provider if you have questions or concerns about how LAGEVRIO may affect sperm. You are being given this fact sheet because your healthcare provider believes it is necessary to provide you with LAGEVRIO for the treatment of adults with mild-to-moderate coronavirus disease 2019 (COVID-19) with positive results of direct SARS-CoV-2 viral testing, and who are at high risk for progression to severe COVID-19 including hospitalization or death, and for whom other COVID-19 treatment options approved or authorized by the FDA are not accessible or clinically appropriate. The U.S. Food and Drug  Administration (FDA) has issued an Emergency Use Authorization (EUA) to make LAGEVRIO available during the COVID-19 pandemic (for more details about an EUA please see "What is an Emergency Use Authorization?" at the end of this document). LAGEVRIO is not an FDA-approved medicine in the [place]. Read this Fact Sheet for information about LAGEVRIO. Talk to your healthcare provider about your options if you have any questions. It is your choice to take LAGEVRIO.  What is COVID-19? COVID-19 is caused by a virus called a coronavirus. You can get COVID-19 through close contact with another person who has the virus. COVID-19 illnesses have ranged from very mild-to-severe, including illness resulting in death. While information so far suggests that most COVID-19 illness is mild, serious illness can happen and may cause some of your other medical conditions to become worse. Older people and people of all ages with severe, long lasting (chronic) medical conditions like heart disease, lung disease and diabetes, for example seem to be at higher risk of being hospitalized for COVID-19.  What is LAGEVRIO? LAGEVRIO is an investigational medicine used to treat mild-to-moderate COVID-19 in adults: ? with positive results of direct SARS-CoV-2 viral testing, and ? who are at high risk for progression to severe COVID-19 including hospitalization or  death, and for whom other COVID-19 treatment options approved or authorized by the FDA are not accessible or clinically appropriate. The FDA has authorized the emergency use of LAGEVRIO for the treatment of mild-tomoderate COVID-19 in adults under an EUA. For more information on EUA, see the "What is an Emergency Use Authorization (EUA)?" section at the end of this Fact Sheet. LAGEVRIO is not authorized: ? for use in people less than 76 years of age. ? for prevention of COVID-19. ? for people needing hospitalization for COVID-19. ? for use for longer than 5  consecutive days.  What should I tell my healthcare provider before I take LAGEVRIO? Tell your healthcare provider if you: ? Have any allergies ? Are breastfeeding or plan to breastfeed ? Have any serious illnesses ? Are taking any medicines (prescription, over-the-counter, vitamins, or herbal products).  How do I take LAGEVRIO? ? Take LAGEVRIO exactly as your healthcare provider tells you to take it. ? Take 4 capsules of LAGEVRIO every 12 hours (for example, at 8 am and at 8 pm) ? Take LAGEVRIO for 5 days. It is important that you complete the full 5 days of treatment with LAGEVRIO. Do not stop taking LAGEVRIO before you complete the full 5 days of treatment, even if you feel better. ? Take LAGEVRIO with or without food. ? You should stay in isolation for as long as your healthcare provider tells you to. Talk to your healthcare provider if you are not sure about how to properly isolate while you have COVID-19. ? Swallow LAGEVRIO capsules whole. Do not open, break, or crush the capsules. If you cannot swallow capsules whole, tell your healthcare provider. ? What to do if you miss a dose: o If it has been less than 10 hours since the missed dose, take it as soon as you remember o If it has been more than 10 hours since the missed dose, skip the missed dose and take your dose at the next scheduled time. ? Do not double the dose of LAGEVRIO to make up for a missed dose.  What are the important possible side effects of LAGEVRIO? ? See, "What is the most important information I should know about LAGEVRIO?" ? Allergic Reactions. Allergic reactions can happen in people taking LAGEVRIO, even after only 1 dose. Stop taking LAGEVRIO and call your healthcare provider right away if you get any of the following symptoms of an allergic reaction: o hives o rapid heartbeat o trouble swallowing or breathing o swelling of the mouth, lips, or face o throat tightness o hoarseness o skin rash The  most common side effects of LAGEVRIO are: ? diarrhea ? nausea ? dizziness These are not all the possible side effects of LAGEVRIO. Not many people have taken LAGEVRIO. Serious and unexpected side effects may happen. This medicine is still being studied, so it is possible that all of the risks are not known at this time.  What other treatment choices are there?  Veklury (remdesivir) is FDA-approved as an intravenous (IV) infusion for the treatment of mildto-moderate WCHEN-27 in certain adults and children. Talk with your doctor to see if Marijean Heath is appropriate for you. Like LAGEVRIO, FDA may also allow for the emergency use of other medicines to treat people with COVID-19. Go to LacrosseProperties.si for more information. It is your choice to be treated or not to be treated with LAGEVRIO. Should you decide not to take it, it will not change your standard medical care.  What if I am breastfeeding?  Breastfeeding is not recommended during treatment with LAGEVRIO and for 4 days after the last dose of LAGEVRIO. If you are breastfeeding or plan to breastfeed, talk to your healthcare provider about your options and specific situation before taking LAGEVRIO.  How do I report side effects with LAGEVRIO? Contact your healthcare provider if you have any side effects that bother you or do not go away. Report side effects to FDA MedWatch at SmoothHits.hu or call 1-800-FDA-1088 (1- 5011756345).  How should I store [place]? ? Store LAGEVRIO capsules at room temperature between 57F to 24F (20C to 25C). ? Keep LAGEVRIO and all medicines out of the reach of children and pets. How can I learn more about COVID-19? ? Ask your healthcare provider. ? Visit SeekRooms.co.uk ? Contact your local or state public health department. ? Call [place] at [phone] (toll free in the  [place] ? Visit [email or web address].com  What Is an Emergency Use Authorization (EUA)? The [place] FDA has made [place] available under an emergency access mechanism called an Emergency Use Authorization (EUA) The EUA is supported by a Present[place], broadcasting Health and Human Service (HHS) declaration that circumstances exist to justify emergency use of drugs and biological products during the COVID-19 pandemic. LAGEVRIO for the treatment of mild-to-moderate COVID-19 in adults with positive results of direct SARS-CoV-2 viral testing, who are at high risk for progression to severe COVID-19, including hospitalization or death, and for whom alternative COVID-19 treatment options approved or authorized by FDA are not accessible or clinically appropriate, has not undergone the same type of review as an FDA-approved product. In issuing an EUA under the GEXBM-84 public health emergency, the FDA has determined, among other things, that based on the total amount of scientific evidence available including data from adequate and well-controlled clinical trials, if available, it is reasonable to believe that the product may be effective for diagnosing, treating, or preventing COVID-19, or a serious or life-threatening disease or condition caused by COVID-19; that the known and potential benefits of the product, when used to diagnose, treat, or prevent such disease or condition, outweigh the known and potential risks of such product; and that there are no adequate, approved, and available alternatives.  All of these criteria must be met to allow for the product to be used in the treatment of patients during the COVID-19 pandemic. The EUA for LAGEVRIO is in effect for the duration of the COVID-19 declaration justifying emergency use of LAGEVRIO, unless terminated or revoked (after which LAGEVRIO may no longer be used under the EUA). For patent information: http://rogers.info/ Copyright  2021-2022 [place] [place], [place] and its affiliates. All rights reserved. [id number] Revised: March 2022

## 2021-06-29 ENCOUNTER — Other Ambulatory Visit (HOSPITAL_BASED_OUTPATIENT_CLINIC_OR_DEPARTMENT_OTHER): Payer: Self-pay

## 2021-06-29 ENCOUNTER — Encounter: Payer: Self-pay | Admitting: Medical

## 2021-06-29 ENCOUNTER — Ambulatory Visit (INDEPENDENT_AMBULATORY_CARE_PROVIDER_SITE_OTHER): Payer: No Typology Code available for payment source | Admitting: Medical

## 2021-06-29 VITALS — BP 120/70 | HR 66 | Resp 18 | Ht 73.0 in | Wt 244.8 lb

## 2021-06-29 DIAGNOSIS — F419 Anxiety disorder, unspecified: Secondary | ICD-10-CM | POA: Diagnosis not present

## 2021-06-29 DIAGNOSIS — R5383 Other fatigue: Secondary | ICD-10-CM | POA: Diagnosis not present

## 2021-06-29 DIAGNOSIS — K219 Gastro-esophageal reflux disease without esophagitis: Secondary | ICD-10-CM | POA: Diagnosis not present

## 2021-06-29 DIAGNOSIS — R739 Hyperglycemia, unspecified: Secondary | ICD-10-CM | POA: Diagnosis not present

## 2021-06-29 DIAGNOSIS — E559 Vitamin D deficiency, unspecified: Secondary | ICD-10-CM

## 2021-06-29 DIAGNOSIS — R110 Nausea: Secondary | ICD-10-CM

## 2021-06-29 MED ORDER — ONDANSETRON HCL 4 MG PO TABS: 4.0000 mg | ORAL_TABLET | Freq: Three times a day (TID) | ORAL | 0 refills | Status: DC | PRN

## 2021-06-29 MED ORDER — CLONAZEPAM 1 MG PO TABS: 1.0000 mg | ORAL_TABLET | Freq: Two times a day (BID) | ORAL | 2 refills | Status: DC | PRN

## 2021-06-29 NOTE — Patient Instructions (Addendum)
Your fatigue maybe post covid(glad to hear much better) but will go ahead and do fatigue work up.  For anxiety refilling your clonazepam. Anxiety controlled. Up to date on uds and controlled med contract.  For gerd continue famotadine.  For nausea rx zofran.  Follow up date to be determined after lab review.

## 2021-06-29 NOTE — Progress Notes (Signed)
Subjective:    Patient ID: Tracy Rich, male    DOB: [birth year], 25 y.o.   MRN: [id number]  HPI Pt in for recent fatigue. Pt thinks his fatigue came on after his covid symptoms resolved for most part. Over last 2 weeks now feeling normal.    No joint pain and no sob.  Also pt has anxiety. He is on clonazepam. On contract and uds.  Genella Rife- sometimes gets nausea with reflux. Request zofran and he is on famotadine.    Review of Systems  Constitutional:  Positive for fatigue. Negative for chills and diaphoresis.  HENT:  Negative for congestion, ear pain and facial swelling.   Eyes:  Negative for pain, redness and visual disturbance.  Respiratory:  Negative for cough, chest tightness, shortness of breath and wheezing.   Cardiovascular:  Negative for chest pain and palpitations.  Gastrointestinal:  Negative for abdominal pain.  Genitourinary:  Negative for enuresis and frequency.  Musculoskeletal:  Negative for back pain.  Neurological:  Negative for dizziness, syncope, weakness and headaches.  Hematological:  Negative for adenopathy. Does not bruise/bleed easily.  Psychiatric/Behavioral:  Negative for behavioral problems and confusion.      Past Medical History:  Diagnosis Date   Anxiety    Reflux      Social History   Socioeconomic History   Marital status: Single    Spouse name: Not on file   Number of children: Not on file   Years of education: Not on file   Highest education level: Not on file  Occupational History   Not on file  Tobacco Use   Smoking status: Former   Smokeless tobacco: Never  Vaping Use   Vaping Use: Never used  Substance and Sexual Activity   Alcohol use: No   Drug use: No   Sexual activity: Not on file  Other Topics Concern   Not on file  Social History Narrative   Caffeine- once every other day   Social Determinants of Health   Financial Resource Strain: Not on file  Food Insecurity: Not on file  Transportation Needs: Not on file   Physical Activity: Not on file  Stress: Not on file  Social Connections: Not on file  Intimate Partner Violence: Not on file    Past Surgical History:  Procedure Laterality Date   TONSILLECTOMY     WISDOM TOOTH EXTRACTION      Family History  Problem Relation Age of Onset   Thyroid disease Mother    Cancer Mother    Hypertension Mother    Thyroid cancer Mother    GER disease Mother    Healthy Father    GER disease Father    Healthy Sister    Esophageal cancer Neg Hx    Colon cancer Neg Hx     No Known Allergies  Current Outpatient Medications on File Prior to Visit  Medication Sig Dispense Refill   clonazePAM (KLONOPIN) 1 MG tablet Take 1 tablet (1 mg total) by mouth 2 (two) times daily as needed for anxiety. 60 tablet 0   famotidine (PEPCID) 20 MG tablet Take 1 tablet (20 mg total) by mouth 2 (two) times daily. 60 tablet 2   [DISCONTINUED] metoprolol succinate (TOPROL-XL) 25 MG 24 hr tablet Take 1 tablet (25 mg total) by mouth daily. 30 tablet 3   [DISCONTINUED] sertraline (ZOLOFT) 25 MG tablet Take 1 tablet (25 mg total) by mouth daily. (Patient not taking: No sig reported) 30 tablet 0   No current  facility-administered medications on file prior to visit.    BP 135/64    Pulse 66    Resp 18    Ht 6\' 1"  (1.854 m)    Wt 244 lb 12.8 oz (111 kg)    SpO2 98%    BMI 32.30 kg/m        Objective:   Physical Exam  General Mental Status- Alert. General Appearance- Not in acute distress.   Skin General: Color- Normal Color. Moisture- Normal Moisture.  Neck Carotid Arteries- Normal color. Moisture- Normal Moisture. No carotid bruits. No JVD.  Chest and Lung Exam Auscultation: Breath Sounds:-Normal.  Cardiovascular Auscultation:Rythm- Regular. Murmurs & Other Heart Sounds:Auscultation of the heart reveals- No Murmurs.  Abdomen Inspection:-Inspeection Normal. Palpation/Percussion:Note:No mass. Palpation and Percussion of the abdomen reveal- Non Tender, Non  Distended + BS, no rebound or guarding.  Neurologic Cranial Nerve exam:- CN III-XII intact(No nystagmus), symmetric smile. Strength:- 5/5 equal and symmetric strength both upper and lower extremities.       Assessment & Plan:   Patient Instructions  Your fatigue maybe post covid but will go ahead and do fatigue work up.  For anxiety refilling your clonazepam. Anxiety controlled. Up to date on uds and controlled med contract.  For gerd continue famotadine.  For nausea rx zofran.  Follow up date to be determined after lab review.   , PA-C

## 2021-06-29 NOTE — Addendum Note (Signed)
Addended by: Mervin Kung A on: 06/29/2021 02:35 PM   Modules accepted: Orders

## 2021-06-30 LAB — IRON: Iron: 132 ug/dL (ref 42–165)

## 2021-06-30 LAB — COMPREHENSIVE METABOLIC PANEL
ALT: 18 U/L (ref 0–53)
AST: 19 U/L (ref 0–37)
Albumin: 4.7 g/dL (ref 3.5–5.2)
Alkaline Phosphatase: 53 U/L (ref 39–117)
BUN: 11 mg/dL (ref 6–23)
CO2: 30 mEq/L (ref 19–32)
Calcium: 9.7 mg/dL (ref 8.4–10.5)
Chloride: 102 mEq/L (ref 96–112)
Creatinine, Ser: 1.04 mg/dL (ref 0.40–1.50)
GFR: 100.58 mL/min (ref >60.00)
Glucose, Bld: 91 mg/dL (ref 70–99)
Potassium: 4.5 mEq/L (ref 3.5–5.1)
Sodium: 138 mEq/L (ref 135–145)
Total Bilirubin: 2 mg/dL — ABNORMAL HIGH (ref 0.2–1.2)
Total Protein: 7 g/dL (ref 6.0–8.3)

## 2021-06-30 LAB — CBC WITH DIFFERENTIAL/PLATELET
Basophils Absolute: 0 10*3/uL (ref 0.0–0.1)
Basophils Relative: 0.7 % (ref 0.0–3.0)
Eosinophils Absolute: 0.2 10*3/uL (ref 0.0–0.7)
Eosinophils Relative: 3.9 % (ref 0.0–5.0)
HCT: 44.2 % (ref 39.0–52.0)
Hemoglobin: 14.7 g/dL (ref 13.0–17.0)
Lymphocytes Relative: 41.8 % (ref 12.0–46.0)
Lymphs Abs: 2.3 10*3/uL (ref 0.7–4.0)
MCHC: 33.3 g/dL (ref 30.0–36.0)
MCV: 91.4 fl (ref 78.0–100.0)
Monocytes Absolute: 0.5 10*3/uL (ref 0.1–1.0)
Monocytes Relative: 9.2 % (ref 3.0–12.0)
Neutro Abs: 2.5 10*3/uL (ref 1.4–7.7)
Neutrophils Relative %: 44.4 % (ref 43.0–77.0)
Platelets: 182 10*3/uL (ref 150.0–400.0)
RBC: 4.83 Mil/uL (ref 4.22–5.81)
RDW: 13 % (ref 11.5–15.5)
WBC: 5.5 10*3/uL (ref 4.0–10.5)

## 2021-06-30 LAB — TSH: TSH: 2.64 u[IU]/mL (ref 0.35–5.50)

## 2021-06-30 LAB — LIPASE: Lipase: 37 U/L (ref 11.0–59.0)

## 2021-06-30 LAB — T4, FREE: Free T4: 0.9 ng/dL (ref 0.60–1.60)

## 2021-06-30 LAB — VITAMIN D 25 HYDROXY (VIT D DEFICIENCY, FRACTURES): VITD: 19.33 ng/mL — ABNORMAL LOW (ref 30.00–100.00)

## 2021-06-30 LAB — VITAMIN B12: Vitamin B-12: 81 pg/mL — ABNORMAL LOW (ref 211–911)

## 2021-06-30 MED ORDER — VITAMIN D (ERGOCALCIFEROL) 1.25 MG (50000 UNIT) PO CAPS: 50000.0000 [IU] | ORAL_CAPSULE | ORAL | 0 refills | Status: DC

## 2021-06-30 NOTE — Addendum Note (Signed)
Addended by: Gwenevere Abbot on: 06/30/2021 03:23 PM   Modules accepted: Orders

## 2021-06-30 NOTE — Progress Notes (Signed)
Patient notified.  First weekly b12 scheduled.

## 2021-07-05 ENCOUNTER — Ambulatory Visit (INDEPENDENT_AMBULATORY_CARE_PROVIDER_SITE_OTHER): Payer: No Typology Code available for payment source | Admitting: *Deleted

## 2021-07-05 DIAGNOSIS — E538 Deficiency of other specified B group vitamins: Secondary | ICD-10-CM | POA: Diagnosis not present

## 2021-07-05 MED ORDER — CYANOCOBALAMIN 1000 MCG/ML IJ SOLN
1000.0000 ug | Freq: Once | INTRAMUSCULAR | Status: AC
  Administered 2021-07-05: 1000 ug via INTRAMUSCULAR

## 2021-07-05 NOTE — Progress Notes (Addendum)
Patient here for first of four weekly b12 per physicians orders.  Injection given in left deltoid and patient tolerated well.  Next injection scheduled for 07/12/21.  [name], PA-C

## 2021-07-12 ENCOUNTER — Ambulatory Visit (INDEPENDENT_AMBULATORY_CARE_PROVIDER_SITE_OTHER): Payer: No Typology Code available for payment source

## 2021-07-12 DIAGNOSIS — E538 Deficiency of other specified B group vitamins: Secondary | ICD-10-CM | POA: Diagnosis not present

## 2021-07-12 MED ORDER — CYANOCOBALAMIN 1000 MCG/ML IJ SOLN
1000.0000 ug | Freq: Once | INTRAMUSCULAR | Status: AC
  Administered 2021-07-12: 1000 ug via INTRAMUSCULAR

## 2021-07-12 NOTE — Progress Notes (Signed)
Dr Etter Sjogren- DOD  EZELL VOGELPOHL is a 26 y.o. male presents to the office today for 2/4 weekly B12 injection, per physician's orders. Original order: 06/30/21 "Recommend getting set up for weekly 12 vitamin injection over next 4 weeks followed by monthly injection for 5 months. Then repeat b12 level in 6 months." B12 1000 mcg, IM was administered L Deltoid (location) today. Patient tolerated injection. Patient next injection due: 1 week, appt made Yes  [name], Kristine Garbe L

## 2021-07-19 ENCOUNTER — Ambulatory Visit (INDEPENDENT_AMBULATORY_CARE_PROVIDER_SITE_OTHER): Payer: No Typology Code available for payment source

## 2021-07-19 DIAGNOSIS — E538 Deficiency of other specified B group vitamins: Secondary | ICD-10-CM

## 2021-07-19 MED ORDER — CYANOCOBALAMIN 1000 MCG/ML IJ SOLN
1000.0000 ug | Freq: Once | INTRAMUSCULAR | Status: AC
  Administered 2021-07-19: 1000 ug via INTRAMUSCULAR

## 2021-07-19 NOTE — Progress Notes (Addendum)
Pt here for weekly B12 injection per PCP order.   B12 given IM in left deltoid, and pt tolerated injection well.  Next B12 injection scheduled for 1 week.   Dulcy Fanny, [place]

## 2021-07-20 ENCOUNTER — Ambulatory Visit: Payer: No Typology Code available for payment source

## 2021-07-21 IMAGING — RF DG ESOPHAGUS
8 of 13 series · 14 of 24 positions shown · non-contrast
Comparison: None.

CLINICAL DATA: 24-year-old male with history of dysphagia.  Reflux.

EXAM:
ESOPHOGRAM / BARIUM SWALLOW / BARIUM TABLET STUDY
TECHNIQUE: Combined double contrast and single contrast examination performed
using effervescent crystals, thick barium liquid, and thin barium
liquid. The patient was observed with fluoroscopy swallowing a 13 mm
barium sulphate tablet.
FLUOROSCOPY TIME:  Fluoroscopy Time:  1 minutes and 36 seconds
Radiation Exposure Index (if provided by the fluoroscopic device):
15.2 mGy

[Series 1: fluoro_barium 2fps_bw · 0.17mm/px · 1 of 1 slices shown (1 of 3)]
[im 1/1]
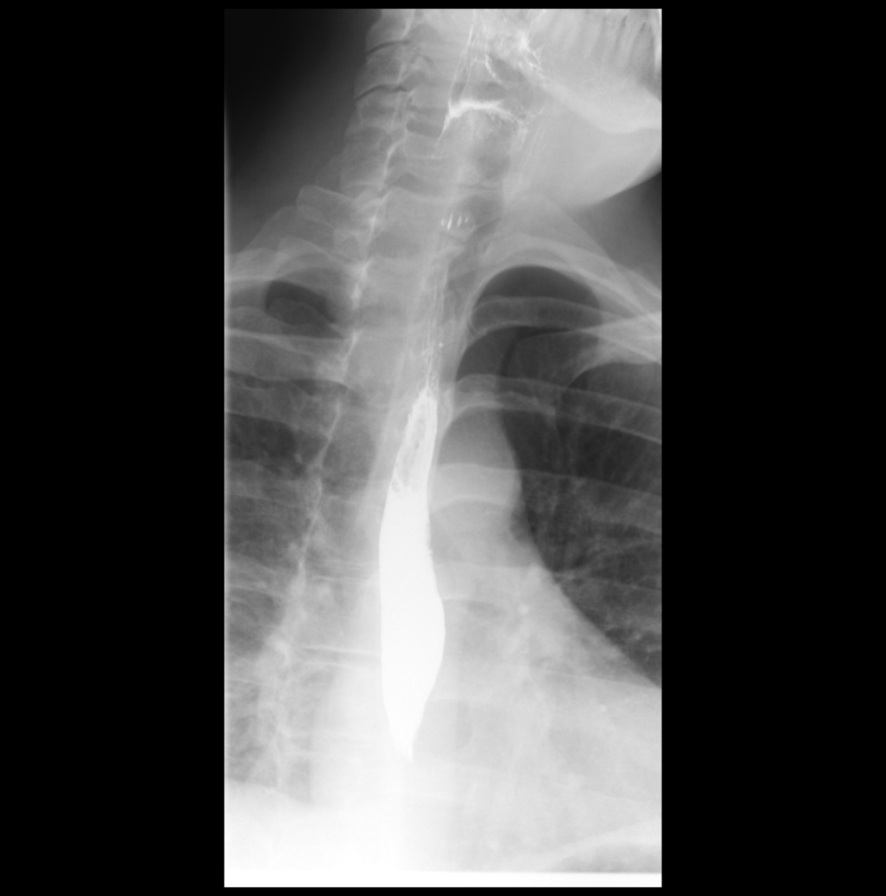

[Series 3: fluoro_barium 2fps_bw · 0.17mm/px · 1 of 1 slices shown (2 of 3)]
[im 1/1]
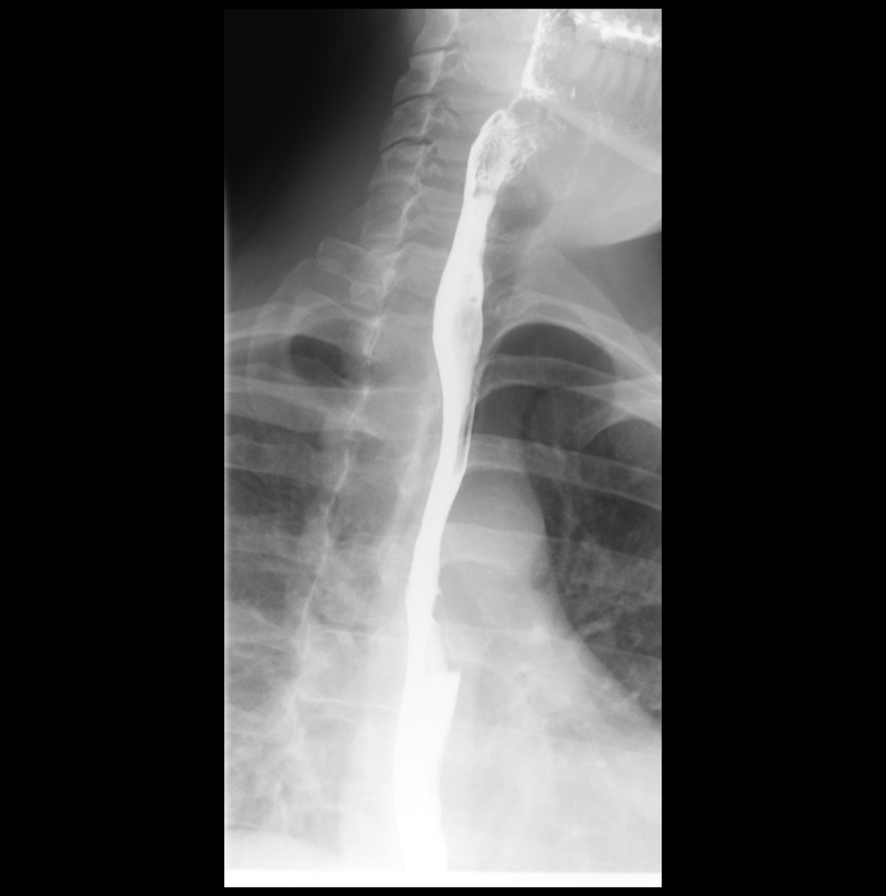

[Series 5: cp_standard · 0.51mm/px · 3 of 26 frames shown (1 of 5)]
[frame 4/26]
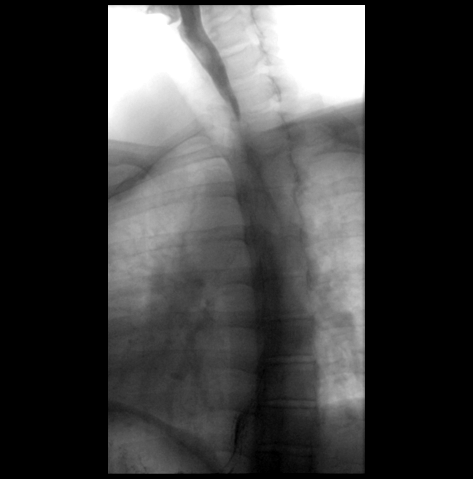
[frame 23/26]
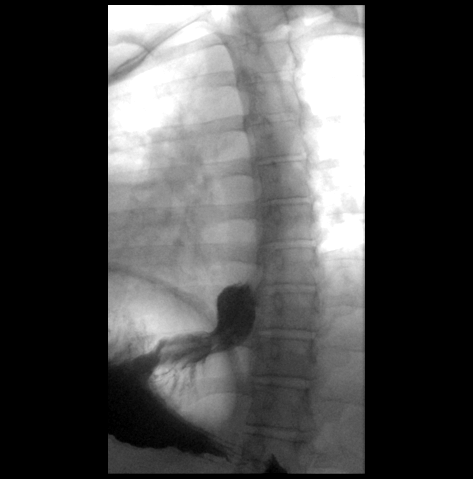
[frame 24/26]
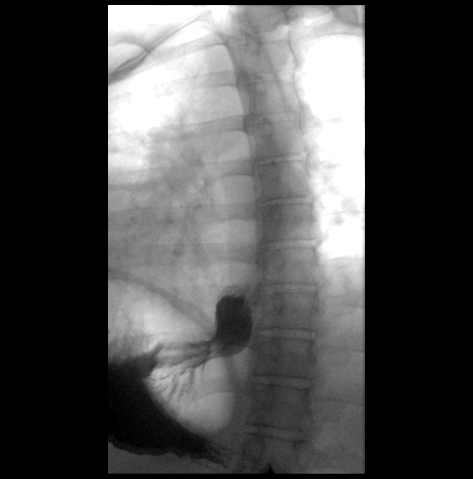

[Series 6: cp_standard · 0.51mm/px · 2 of 25 frames shown (2 of 5)]
[frame 13/25]
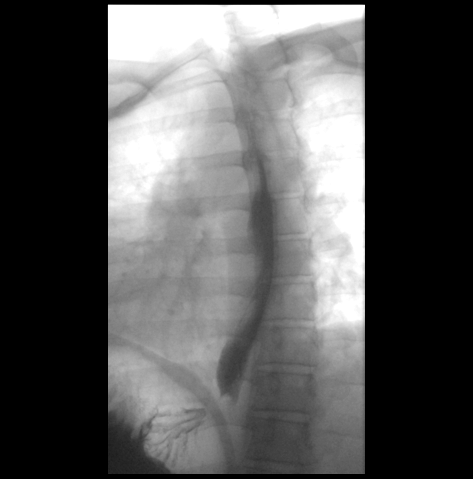
[frame 25/25]
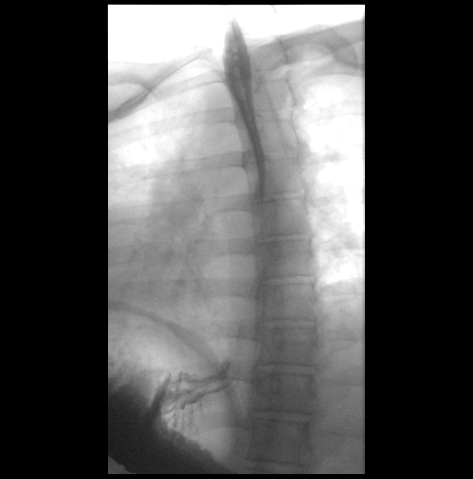

[Series 7: cp_standard · 0.51mm/px · 2 of 32 frames shown (3 of 5)]
[frame 17/32]
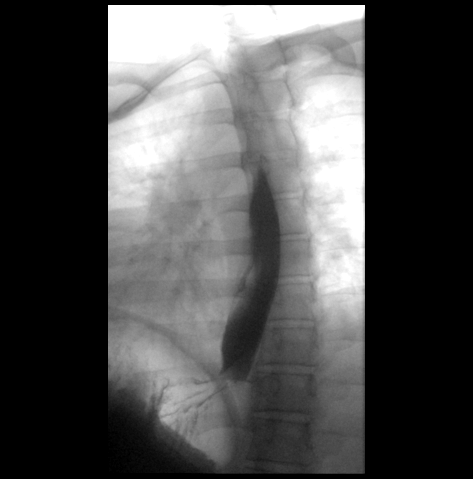
[frame 28/32]
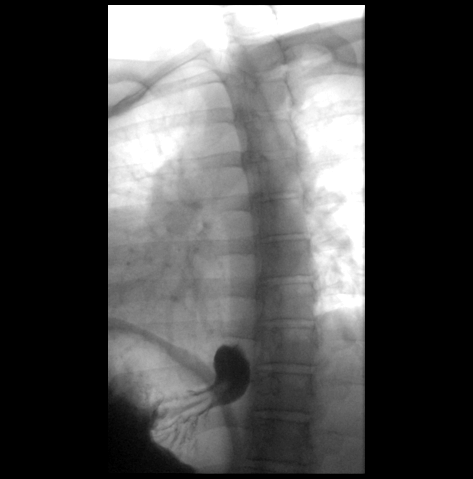

[Series 9: fluoro_barium 2fps_bw · 0.17mm/px · 1 of 1 slices shown (3 of 3)]
[im 1/1]
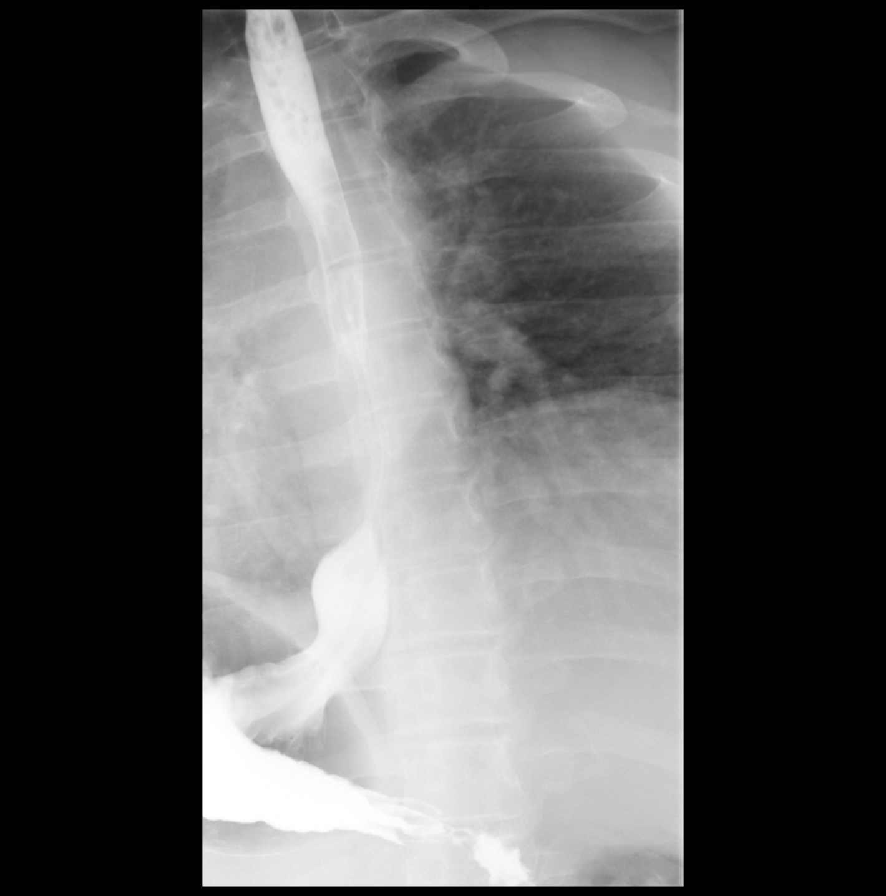

[Series 11: cp_standard · 0.53mm/px · 3 of 64 frames shown (4 of 5)]
[frame 10/64]
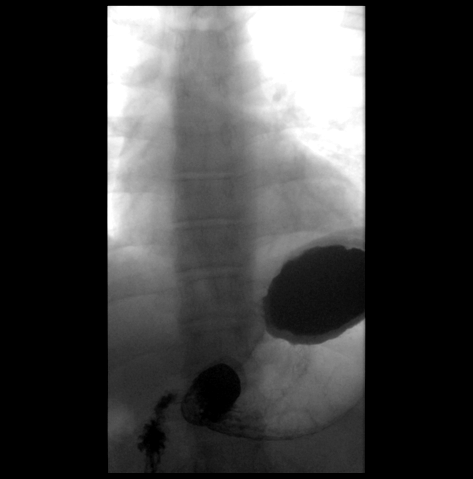
[frame 33/64]
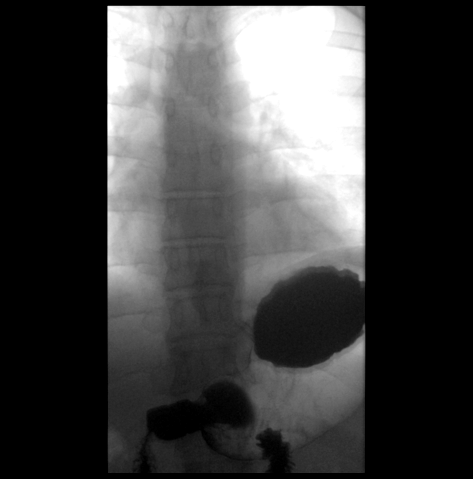
[frame 61/64]
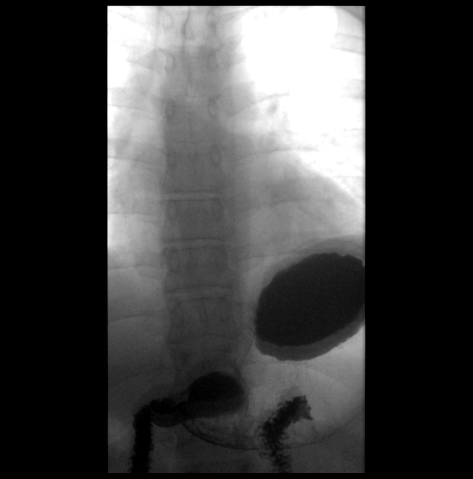

[Series 13: cp_standard · 0.27mm/px · 1 of 1 slices shown (5 of 5)]
[im 1/1]
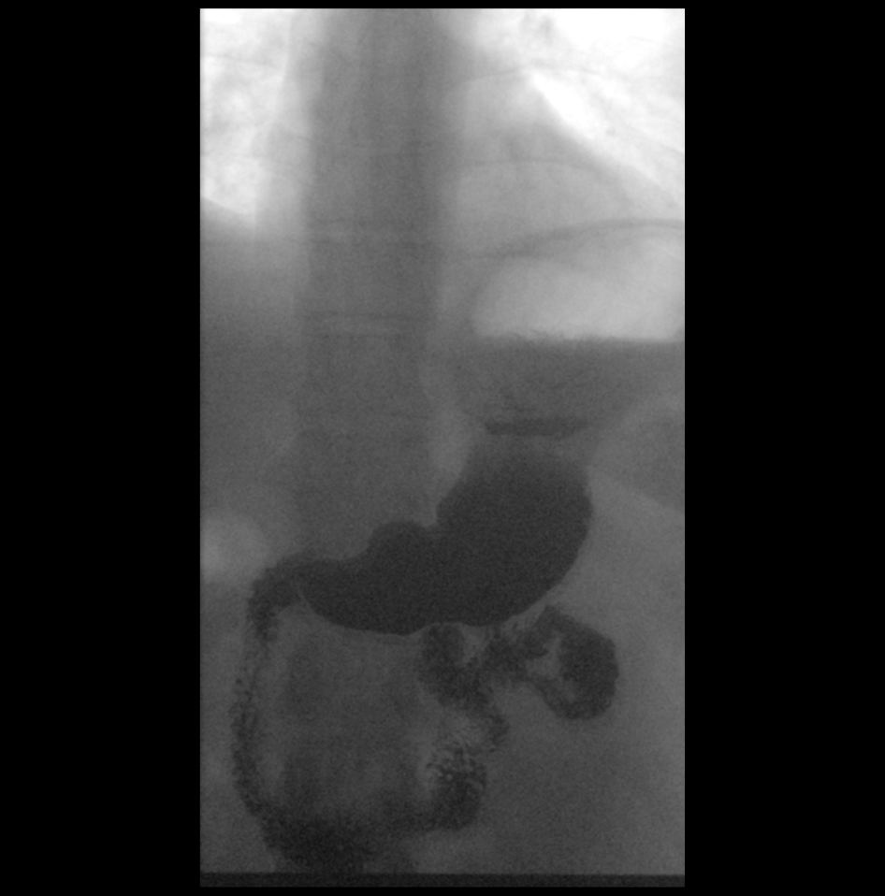

[14 of 24 positions shown; findings below may reference images not displayed]

FINDINGS: Double contrast images demonstrated a normal appearance of the
esophageal mucosa. Multiple single swallow attempts were observed,
which demonstrated normal esophageal motility. Full column
esophagram demonstrated no esophageal mass, stricture or esophageal
ring. No hiatal hernia. Water siphon test demonstrated no
gastroesophageal reflux. A barium tablet was administered, which
passed readily into the stomach.
IMPRESSION: 1. Normal esophagram, as above. No findings to account for the
patient's symptoms.

## 2021-07-26 ENCOUNTER — Encounter: Payer: Self-pay | Admitting: Medical

## 2021-07-27 ENCOUNTER — Other Ambulatory Visit (HOSPITAL_BASED_OUTPATIENT_CLINIC_OR_DEPARTMENT_OTHER): Payer: Self-pay

## 2021-07-27 ENCOUNTER — Ambulatory Visit (INDEPENDENT_AMBULATORY_CARE_PROVIDER_SITE_OTHER): Payer: No Typology Code available for payment source

## 2021-07-27 DIAGNOSIS — E538 Deficiency of other specified B group vitamins: Secondary | ICD-10-CM | POA: Diagnosis not present

## 2021-07-27 MED ORDER — CYANOCOBALAMIN 1000 MCG/ML IJ SOLN
1000.0000 ug | Freq: Once | INTRAMUSCULAR | Status: AC
  Administered 2021-07-27: 11:00:00 1000 ug via INTRAMUSCULAR

## 2021-07-27 MED ORDER — CLONAZEPAM 1 MG PO TABS
1.0000 mg | ORAL_TABLET | Freq: Two times a day (BID) | ORAL | 2 refills | Status: DC | PRN
  Filled 2021-07-27: qty 60, 30d supply, fill #0
  Filled 2021-08-30: qty 60, 30d supply, fill #1

## 2021-07-27 MED ORDER — VITAMIN D (ERGOCALCIFEROL) 1.25 MG (50000 UNIT) PO CAPS
50000.0000 [IU] | ORAL_CAPSULE | ORAL | 4 refills | Status: DC
  Filled 2021-07-27: qty 8, 56d supply, fill #0
  Filled 2021-11-04: qty 8, 56d supply, fill #1

## 2021-07-27 NOTE — Progress Notes (Signed)
Pt here for 4/4 weekly B12 injection per PCP order.    B12 1046mcg given IM in left deltoid, and pt tolerated injection well.   Next B12 injection will be scheduled later

## 2021-08-06 ENCOUNTER — Encounter: Payer: Self-pay | Admitting: Medical

## 2021-08-10 ENCOUNTER — Ambulatory Visit (INDEPENDENT_AMBULATORY_CARE_PROVIDER_SITE_OTHER): Payer: No Typology Code available for payment source | Admitting: Medical

## 2021-08-10 VITALS — BP 110/64 | HR 79 | Temp 98.2°F | Resp 18 | Ht 73.0 in | Wt 248.6 lb

## 2021-08-10 DIAGNOSIS — E538 Deficiency of other specified B group vitamins: Secondary | ICD-10-CM

## 2021-08-10 DIAGNOSIS — F419 Anxiety disorder, unspecified: Secondary | ICD-10-CM

## 2021-08-10 DIAGNOSIS — R000 Tachycardia, unspecified: Secondary | ICD-10-CM

## 2021-08-10 DIAGNOSIS — R739 Hyperglycemia, unspecified: Secondary | ICD-10-CM | POA: Diagnosis not present

## 2021-08-10 DIAGNOSIS — R5383 Other fatigue: Secondary | ICD-10-CM

## 2021-08-10 LAB — CBC WITH DIFFERENTIAL/PLATELET
Basophils Absolute: 0 10*3/uL (ref 0.0–0.1)
Basophils Relative: 0.9 % (ref 0.0–3.0)
Eosinophils Absolute: 0.2 10*3/uL (ref 0.0–0.7)
Eosinophils Relative: 4.2 % (ref 0.0–5.0)
HCT: 44.5 % (ref 39.0–52.0)
Hemoglobin: 14.7 g/dL (ref 13.0–17.0)
Lymphocytes Relative: 38.6 % (ref 12.0–46.0)
Lymphs Abs: 2 10*3/uL (ref 0.7–4.0)
MCHC: 33 g/dL (ref 30.0–36.0)
MCV: 91.6 fl (ref 78.0–100.0)
Monocytes Absolute: 0.5 10*3/uL (ref 0.1–1.0)
Monocytes Relative: 8.7 % (ref 3.0–12.0)
Neutro Abs: 2.5 10*3/uL (ref 1.4–7.7)
Neutrophils Relative %: 47.6 % (ref 43.0–77.0)
Platelets: 182 10*3/uL (ref 150.0–400.0)
RBC: 4.86 Mil/uL (ref 4.22–5.81)
RDW: 12.9 % (ref 11.5–15.5)
WBC: 5.2 10*3/uL (ref 4.0–10.5)

## 2021-08-10 LAB — VITAMIN B12: Vitamin B-12: 351 pg/mL (ref 211–911)

## 2021-08-10 LAB — IRON: Iron: 108 ug/dL (ref 42–165)

## 2021-08-10 LAB — COMPREHENSIVE METABOLIC PANEL
ALT: 18 U/L (ref 0–53)
AST: 16 U/L (ref 0–37)
Albumin: 4.6 g/dL (ref 3.5–5.2)
Alkaline Phosphatase: 53 U/L (ref 39–117)
BUN: 12 mg/dL (ref 6–23)
CO2: 28 mEq/L (ref 19–32)
Calcium: 9.6 mg/dL (ref 8.4–10.5)
Chloride: 103 mEq/L (ref 96–112)
Creatinine, Ser: 1.04 mg/dL (ref 0.40–1.50)
GFR: 100.5 mL/min (ref >60.00)
Glucose, Bld: 94 mg/dL (ref 70–99)
Potassium: 4.8 mEq/L (ref 3.5–5.1)
Sodium: 139 mEq/L (ref 135–145)
Total Bilirubin: 1.6 mg/dL — ABNORMAL HIGH (ref 0.2–1.2)
Total Protein: 7.1 g/dL (ref 6.0–8.3)

## 2021-08-10 LAB — HEMOGLOBIN A1C: Hgb A1c MFr Bld: 5.4 % (ref 4.6–6.5)

## 2021-08-10 NOTE — Progress Notes (Signed)
Subjective:    Patient ID: Tracy Rich, male    DOB: 1995/11/27, 26 y.o.   MRN: [id number]  HPI Pt had sent me messages by my chart reporting severe fatigue.   He has low b12 in past and low vit D level. Pt states when he started b12 and vit D felt immediate improvement in energy level. He states at work did not feel tired and sleeping well. Since Monday has been taking 1000 mcg tablets.   Pt had initial 4 weekly b12 injections.   Pt took vit D 5000 weekly for 4 weeks. 20 days since his last vit D tab.  Pt tells me the other day climbing 3 flights of step hr increased to 160. This was on Sunday. He was not anxious at that time. Rested and pulse came down. Then he was walking around his appartment and pulse was 115.   He has hx of anxiety but not feeling like having panic attacks.     Review of Systems  Constitutional:  Negative for chills, fatigue and fever.  Respiratory:  Negative for cough, chest tightness and wheezing.   Cardiovascular:  Negative for chest pain and palpitations.  Gastrointestinal:  Negative for abdominal pain.  Genitourinary:  Negative for dysuria, flank pain and frequency.  Musculoskeletal:  Negative for back pain.  Skin:  Negative for pallor and rash.  Neurological:  Negative for dizziness, speech difficulty, weakness, numbness and headaches.  Hematological:  Negative for adenopathy. Does not bruise/bleed easily.  Psychiatric/Behavioral:  Negative for behavioral problems and decreased concentration. The patient is not nervous/anxious.     Past Medical History:  Diagnosis Date   Anxiety    Reflux      Social History   Socioeconomic History   Marital status: Single    Spouse name: Not on file   Number of children: Not on file   Years of education: Not on file   Highest education level: Not on file  Occupational History   Not on file  Tobacco Use   Smoking status: Former   Smokeless tobacco: Never  Vaping Use   Vaping Use: Never used   Substance and Sexual Activity   Alcohol use: No   Drug use: No   Sexual activity: Not on file  Other Topics Concern   Not on file  Social History Narrative   Caffeine- once every other day   Social Determinants of Health   Financial Resource Strain: Not on file  Food Insecurity: Not on file  Transportation Needs: Not on file  Physical Activity: Not on file  Stress: Not on file  Social Connections: Not on file  Intimate Partner Violence: Not on file    Past Surgical History:  Procedure Laterality Date   TONSILLECTOMY     WISDOM TOOTH EXTRACTION      Family History  Problem Relation Age of Onset   Thyroid disease Mother    Cancer Mother    Hypertension Mother    Thyroid cancer Mother    GER disease Mother    Healthy Father    GER disease Father    Healthy Sister    Esophageal cancer Neg Hx    Colon cancer Neg Hx     No Known Allergies  Current Outpatient Medications on File Prior to Visit  Medication Sig Dispense Refill   clonazePAM (KLONOPIN) 1 MG tablet Take 1 tablet (1 mg total) by mouth 2 (two) times daily as needed for anxiety. 60 tablet 2   clonazePAM (  KLONOPIN) 1 MG tablet Take 1 tablet (1 mg total) by mouth 2 (two) times daily as needed for anxiety. 60 tablet 2   famotidine (PEPCID) 20 MG tablet Take 1 tablet (20 mg total) by mouth 2 (two) times daily. 60 tablet 2   ondansetron (ZOFRAN) 4 MG tablet Take 1 tablet (4 mg total) by mouth every 8 (eight) hours as needed for nausea or vomiting. 20 tablet 0   Vitamin D, Ergocalciferol, (DRISDOL) 1.25 MG (50000 UNIT) CAPS capsule Take 1 capsule (50,000 Units total) by mouth every 7 (seven) days. 8 capsule 0   Vitamin D, Ergocalciferol, (DRISDOL) 1.25 MG (50000 UNIT) CAPS capsule Take 1 capsule (50,000 Units total) by mouth every 7 (seven) days. 8 capsule 4   [DISCONTINUED] metoprolol succinate (TOPROL-XL) 25 MG 24 hr tablet Take 1 tablet (25 mg total) by mouth daily. 30 tablet 3   [DISCONTINUED] sertraline (ZOLOFT)  25 MG tablet Take 1 tablet (25 mg total) by mouth daily. (Patient not taking: No sig reported) 30 tablet 0   No current facility-administered medications on file prior to visit.    BP 110/64    Pulse 79    Temp 98.2 F (36.8 C)    Resp 18    Ht 6\' 1"  (1.854 m)    Wt 248 lb 9.6 oz (112.8 kg)    SpO2 100%    BMI 32.80 kg/m        Objective:   Physical Exam  General Mental Status- Alert. General Appearance- Not in acute distress.   Skin General: Color- Normal Color. Moisture- Normal Moisture.  Neck Carotid Arteries- Normal color. Moisture- Normal Moisture. No carotid bruits. No JVD.  Chest and Lung Exam Auscultation: Breath Sounds:-Normal.  Cardiovascular Auscultation:Rythm- Regular. Murmurs & Other Heart Sounds:Auscultation of the heart reveals- No Murmurs.  Abdomen Inspection:-Inspeection Normal. Palpation/Percussion:Note:No mass. Palpation and Percussion of the abdomen reveal- Non Tender, Non Distended + BS, no rebound or guarding.  Neurologic Cranial Nerve exam:- CN III-XII intact(No nystagmus), symmetric smile. Strength:- 5/5 equal and symmetric strength both upper and lower extremities.       Assessment & Plan:   Patient Instructions  For low b12/deficiency can use 1000 mcg daily. Get set up for b12 injection in 2 weeks. Will get monthly injections for next 5 months. Then repeat level. Depending on level today might have you do 5000 mcg daily dose.  For fatigue will get cbc, cmp and iron level.  For elevate sugar in the past will get a1c.  Recent 2 events of tachycardia. Hx of palpitations and anxiety.But recently with fast hr he was not anxious. Prior ekg have been normal. Will go ahead and place referral to cardiologist as you may benefit from zio patch.  For anxiety continue clonazepam.   Follow up 4 weeks or sooner if needed.    , PA-C

## 2021-08-10 NOTE — Patient Instructions (Addendum)
For low b12/deficiency can use 1000 mcg daily. Get set up for b12 injection in 2 weeks. Will get monthly injections for next 5 months. Then repeat level. Depending on level today might have you do 5000 mcg daily dose.  For fatigue will get cbc, cmp and iron level.  For elevate sugar in the past will get a1c.  Recent 2 events of tachycardia. Hx of palpitations and anxiety.But recently with fast hr he was not anxious. Prior ekg have been normal. Will go ahead and place referral to cardiologist as you may benefit from zio patch.  For anxiety continue clonazepam.   Follow up 4 weeks or sooner if needed.

## 2021-08-24 ENCOUNTER — Ambulatory Visit (INDEPENDENT_AMBULATORY_CARE_PROVIDER_SITE_OTHER): Payer: No Typology Code available for payment source

## 2021-08-24 ENCOUNTER — Ambulatory Visit: Payer: No Typology Code available for payment source

## 2021-08-24 DIAGNOSIS — E538 Deficiency of other specified B group vitamins: Secondary | ICD-10-CM | POA: Diagnosis not present

## 2021-08-24 MED ORDER — CYANOCOBALAMIN 1000 MCG/ML IJ SOLN
1000.0000 ug | INTRAMUSCULAR | Status: DC
  Administered 2021-08-24: 1000 ug via INTRAMUSCULAR

## 2021-08-24 NOTE — Progress Notes (Addendum)
Pt here for monthly B12 injection per PCP order.    B12 given IM in left deltoid, and pt tolerated injection well.  Pt scheduled for next B12 on 09/21/21    [name], PA-C

## 2021-08-30 ENCOUNTER — Other Ambulatory Visit (HOSPITAL_BASED_OUTPATIENT_CLINIC_OR_DEPARTMENT_OTHER): Payer: Self-pay

## 2021-09-09 ENCOUNTER — Ambulatory Visit: Payer: No Typology Code available for payment source | Admitting: Cardiology

## 2021-09-16 ENCOUNTER — Other Ambulatory Visit (HOSPITAL_COMMUNITY): Payer: Self-pay

## 2021-09-21 ENCOUNTER — Ambulatory Visit: Payer: No Typology Code available for payment source

## 2021-09-22 ENCOUNTER — Ambulatory Visit (INDEPENDENT_AMBULATORY_CARE_PROVIDER_SITE_OTHER): Payer: No Typology Code available for payment source

## 2021-09-22 DIAGNOSIS — E538 Deficiency of other specified B group vitamins: Secondary | ICD-10-CM | POA: Diagnosis not present

## 2021-09-22 MED ORDER — CYANOCOBALAMIN 1000 MCG/ML IJ SOLN
1000.0000 ug | Freq: Once | INTRAMUSCULAR | Status: AC
  Administered 2021-09-22: 1000 ug via INTRAMUSCULAR

## 2021-09-22 NOTE — Progress Notes (Signed)
Pt here for monthly B12 injection per PCP order. This is Pt 2 of 5 B-12 Monthly  ?  ?B12 given IM in left deltoid, and pt tolerated injection well. ?  ?Pt scheduled for next B12 on 10/20/21 ?

## 2021-09-30 ENCOUNTER — Other Ambulatory Visit: Payer: Self-pay | Admitting: Medical

## 2021-09-30 ENCOUNTER — Other Ambulatory Visit (HOSPITAL_BASED_OUTPATIENT_CLINIC_OR_DEPARTMENT_OTHER): Payer: Self-pay

## 2021-10-03 ENCOUNTER — Telehealth: Payer: Self-pay | Admitting: Medical

## 2021-10-03 ENCOUNTER — Other Ambulatory Visit (HOSPITAL_BASED_OUTPATIENT_CLINIC_OR_DEPARTMENT_OTHER): Payer: Self-pay

## 2021-10-03 MED FILL — Clonazepam Tab 1 MG: ORAL | 30 days supply | Qty: 60 | Fill #0 | Status: AC

## 2021-10-03 NOTE — Telephone Encounter (Signed)
Patient stated that the pharmacy contacted him via text and advised that rx was ready. ?

## 2021-10-03 NOTE — Telephone Encounter (Addendum)
? ? ?  Requesting: clonazepam 1mg  ?Contract: 10/03/21 ?UDS: 03/08/2021 ?Last Visit: 04/28/21 ?Next Visit:10/20/21 ? ?I refilled med for one month. Is he up to date on contract? Did he sign contract today? ? ?Thanks, ? ? ?

## 2021-10-03 NOTE — Telephone Encounter (Signed)
Requesting: Contract: UDS: Last Visit: Next Visit: Last Refill:  Please Advise  

## 2021-10-03 NOTE — Telephone Encounter (Signed)
Patient is needing his refill for Clonazepam ?Informed has been sent in , but states pharmacy does not have it yet. ?Will send msg to patient assist. ? ? ?

## 2021-10-05 DIAGNOSIS — F419 Anxiety disorder, unspecified: Secondary | ICD-10-CM | POA: Insufficient documentation

## 2021-10-11 ENCOUNTER — Ambulatory Visit: Payer: No Typology Code available for payment source | Admitting: Cardiology

## 2021-10-20 ENCOUNTER — Ambulatory Visit (INDEPENDENT_AMBULATORY_CARE_PROVIDER_SITE_OTHER): Payer: No Typology Code available for payment source

## 2021-10-20 DIAGNOSIS — E538 Deficiency of other specified B group vitamins: Secondary | ICD-10-CM | POA: Diagnosis not present

## 2021-10-20 MED ORDER — CYANOCOBALAMIN 1000 MCG/ML IJ SOLN
1000.0000 ug | Freq: Once | INTRAMUSCULAR | Status: AC
  Administered 2021-10-20: 1000 ug via INTRAMUSCULAR

## 2021-10-20 NOTE — Progress Notes (Addendum)
RAVIS HERNE is a 26 y.o. male presents to the office today for B12 injections, per physician's orders. ?Original order: on lab results from 06-30-21 to do once a week for one month and monthly for 5 months after that.  ?Cyanocobalamin (med), 1000 mg/ml (dose),  im (route) was administered left deltoid (location) today. Patient tolerated injection. ? ?Patient next injection due: in one moth, appt made for 11-22-21.  ? ?[place]  ? ?[name], PA-C  ?

## 2021-11-04 ENCOUNTER — Other Ambulatory Visit (HOSPITAL_BASED_OUTPATIENT_CLINIC_OR_DEPARTMENT_OTHER): Payer: Self-pay

## 2021-11-04 MED FILL — Clonazepam Tab 1 MG: ORAL | 30 days supply | Qty: 60 | Fill #1 | Status: AC

## 2021-11-15 ENCOUNTER — Ambulatory Visit: Payer: No Typology Code available for payment source | Admitting: Cardiology

## 2021-11-22 ENCOUNTER — Ambulatory Visit (INDEPENDENT_AMBULATORY_CARE_PROVIDER_SITE_OTHER): Payer: No Typology Code available for payment source | Admitting: *Deleted

## 2021-11-22 DIAGNOSIS — E538 Deficiency of other specified B group vitamins: Secondary | ICD-10-CM | POA: Diagnosis not present

## 2021-11-22 MED ORDER — CYANOCOBALAMIN 1000 MCG/ML IJ SOLN
1000.0000 ug | Freq: Once | INTRAMUSCULAR | Status: AC
  Administered 2021-11-22: 1000 ug via INTRAMUSCULAR

## 2021-11-22 NOTE — Progress Notes (Addendum)
Patient here for monthly b12 injection per PCP orders.    Today per PCP patient has the option to continue b12 injections after this one and then checking level in 2 months or he can get otc b12 to take daily.  Per patient he has been taking in addition to injection the of b12.  He usually takes it 1 weekly after monthly injection.  He would like to continue both.  Advised to recheck in labs in 2 months.  Injection given in left deltoid and patient tolerated well.  Patient scheduled for 01/04/22.  [name], PA-C

## 2021-11-23 ENCOUNTER — Ambulatory Visit: Payer: No Typology Code available for payment source

## 2021-12-02 ENCOUNTER — Ambulatory Visit: Payer: No Typology Code available for payment source | Admitting: Medical

## 2021-12-05 ENCOUNTER — Ambulatory Visit: Payer: No Typology Code available for payment source | Admitting: Medical

## 2021-12-08 ENCOUNTER — Other Ambulatory Visit (HOSPITAL_BASED_OUTPATIENT_CLINIC_OR_DEPARTMENT_OTHER): Payer: Self-pay

## 2021-12-08 MED FILL — Clonazepam Tab 1 MG: ORAL | 30 days supply | Qty: 60 | Fill #2 | Status: AC

## 2022-01-04 ENCOUNTER — Ambulatory Visit: Payer: No Typology Code available for payment source

## 2022-01-04 NOTE — Progress Notes (Deleted)
Patient here for monthly # 5 B12 injection per PCP orders.   Today per PCP patient has the option to continue b12 injections after this one and then checking level in 2 months or he can get otc b12 to take daily.  Per patient he has been taking in addition to injection the of b12. He usually takes it 1 weekly after monthly injection. He would like to continue both.  Advised to recheck in labs in 2 months

## 2022-01-05 ENCOUNTER — Other Ambulatory Visit (HOSPITAL_BASED_OUTPATIENT_CLINIC_OR_DEPARTMENT_OTHER): Payer: Self-pay

## 2022-01-05 ENCOUNTER — Ambulatory Visit (INDEPENDENT_AMBULATORY_CARE_PROVIDER_SITE_OTHER): Payer: No Typology Code available for payment source

## 2022-01-05 ENCOUNTER — Other Ambulatory Visit: Payer: Self-pay | Admitting: Medical

## 2022-01-05 DIAGNOSIS — E538 Deficiency of other specified B group vitamins: Secondary | ICD-10-CM

## 2022-01-05 MED ORDER — FAMOTIDINE 20 MG PO TABS
20.0000 mg | ORAL_TABLET | Freq: Two times a day (BID) | ORAL | 2 refills | Status: DC
Start: 2022-01-05 — End: 2022-10-19
  Filled 2022-01-05: qty 60, 30d supply, fill #0
  Filled 2022-03-15: qty 60, 30d supply, fill #1
  Filled 2022-08-21: qty 27, 13d supply, fill #2
  Filled 2022-08-21: qty 33, 17d supply, fill #2
  Filled 2022-08-21: qty 60, 30d supply, fill #2

## 2022-01-05 MED ORDER — CLONAZEPAM 1 MG PO TABS
ORAL_TABLET | ORAL | 2 refills | Status: DC
  Filled 2022-01-05: qty 60, 30d supply, fill #0
  Filled 2022-02-09: qty 60, 30d supply, fill #1
  Filled 2022-03-15: qty 60, 30d supply, fill #2

## 2022-01-05 MED ORDER — CYANOCOBALAMIN 1000 MCG/ML IJ SOLN
1000.0000 ug | Freq: Once | INTRAMUSCULAR | Status: AC
  Administered 2022-01-05: 1000 ug via INTRAMUSCULAR

## 2022-01-05 NOTE — Progress Notes (Addendum)
Tracy Rich is a 26 y.o. male presents to the office today for B12 injections, per physician's orders. Original order: on lab results from 06-30-21 to do once a week for one month and monthly for 5 months after that.  Patient wanted to reports he is no longer taking otc B12. Cyanocobalamin (med), 1000 mg/ml (dose),  im (route) was administered left deltoid (location) today. Patient tolerated injection.   Patient next injection due: in one moth, appt made for 02/07/2022. He will have B12 check on that same day prior to injections to check on his levels.   [name], PA-C

## 2022-01-05 NOTE — Telephone Encounter (Addendum)
Requesting: klonopin  Contract:04/28/21 UDS:04/07/21 Last Visit:08/06/21 Next Visit:02/07/22 Last Refill:10/03/21  Please Advise   Rx refill sent to pharmacy.  [name], PA-C

## 2022-01-13 ENCOUNTER — Other Ambulatory Visit (HOSPITAL_BASED_OUTPATIENT_CLINIC_OR_DEPARTMENT_OTHER): Payer: Self-pay

## 2022-01-31 ENCOUNTER — Other Ambulatory Visit (HOSPITAL_COMMUNITY)
Admission: RE | Admit: 2022-01-31 | Discharge: 2022-01-31 | Disposition: A | Discharge status: Home / Self Care | Payer: No Typology Code available for payment source | Source: Ambulatory Visit | Attending: Medical | Admitting: Medical

## 2022-01-31 ENCOUNTER — Ambulatory Visit (INDEPENDENT_AMBULATORY_CARE_PROVIDER_SITE_OTHER): Payer: No Typology Code available for payment source | Admitting: Medical

## 2022-01-31 ENCOUNTER — Other Ambulatory Visit (HOSPITAL_BASED_OUTPATIENT_CLINIC_OR_DEPARTMENT_OTHER): Payer: Self-pay

## 2022-01-31 VITALS — BP 140/90 | HR 74 | Temp 98.2°F | Resp 18 | Ht 73.0 in | Wt 261.8 lb

## 2022-01-31 DIAGNOSIS — E559 Vitamin D deficiency, unspecified: Secondary | ICD-10-CM | POA: Diagnosis not present

## 2022-01-31 DIAGNOSIS — R21 Rash and other nonspecific skin eruption: Secondary | ICD-10-CM | POA: Diagnosis not present

## 2022-01-31 DIAGNOSIS — Z113 Encounter for screening for infections with a predominantly sexual mode of transmission: Secondary | ICD-10-CM | POA: Insufficient documentation

## 2022-01-31 DIAGNOSIS — L299 Pruritus, unspecified: Secondary | ICD-10-CM

## 2022-01-31 MED ORDER — HYDROXYZINE PAMOATE 25 MG PO CAPS
25.0000 mg | ORAL_CAPSULE | Freq: Three times a day (TID) | ORAL | 0 refills | Status: DC | PRN
  Filled 2022-01-31: qty 30, 10d supply, fill #0

## 2022-01-31 NOTE — Progress Notes (Signed)
Subjective:    Patient ID: Tracy Rich, male    DOB: 12-06-1995, 26 y.o.   MRN: [id number]  HPI Pt in for follow up.  Pt states he got red raised area on shaft of his penis. This has been present for about 10 days. He also has rash/bumps on hands. Small lesion on bottom of feet and sores in his mouth.    Pt states rash on his hands itch.  He states 3 weeks ago was around someone who had hand foot and mouth. This person got hand foot and mouth from his kids.     Review of Systems  Constitutional:  Negative for chills, fatigue and fever.  Respiratory:  Negative for cough, chest tightness, wheezing and stridor.   Cardiovascular:  Negative for chest pain and palpitations.  Gastrointestinal:  Negative for abdominal pain, blood in stool and diarrhea.  Genitourinary:  Negative for dysuria, flank pain, frequency, penile discharge, penile pain and penile swelling.       Penile lesion  Musculoskeletal:  Negative for back pain.  Skin:  Positive for rash.  Neurological:  Negative for syncope, facial asymmetry, weakness, numbness and headaches.  Hematological:  Negative for adenopathy. Does not bruise/bleed easily.    Past Medical History:  Diagnosis Date   Anxiety    Hydradenitis 01/03/2021   Patient states history of hidradenitis suppurativa     Social History   Socioeconomic History   Marital status: Single    Spouse name: Not on file   Number of children: Not on file   Years of education: Not on file   Highest education level: Not on file  Occupational History   Not on file  Tobacco Use   Smoking status: Former   Smokeless tobacco: Never  Vaping Use   Vaping Use: Never used  Substance and Sexual Activity   Alcohol use: No   Drug use: No   Sexual activity: Not on file  Other Topics Concern   Not on file  Social History Narrative   Caffeine- once every other day   Social Determinants of Health   Financial Resource Strain: Not on file  Food Insecurity: Not on  file  Transportation Needs: Not on file  Physical Activity: Not on file  Stress: Not on file  Social Connections: Not on file  Intimate Partner Violence: Not on file    Past Surgical History:  Procedure Laterality Date   TONSILLECTOMY     WISDOM TOOTH EXTRACTION      Family History  Problem Relation Age of Onset   Thyroid disease Mother    Cancer Mother    Hypertension Mother    Thyroid cancer Mother    GER disease Mother    Healthy Father    GER disease Father    Healthy Sister    Esophageal cancer Neg Hx    Colon cancer Neg Hx     No Known Allergies  Current Outpatient Medications on File Prior to Visit  Medication Sig Dispense Refill   clonazePAM (KLONOPIN) 1 MG tablet Take 1 tablet (1 mg total) by mouth 2 (two) times daily as needed for anxiety. 60 tablet 2   Cyanocobalamin ([employer] VITAMIN B12 PO) Take 1 tablet by mouth daily.     famotidine (PEPCID) 20 MG tablet Take 1 tablet (20 mg total) by mouth 2 (two) times daily. 60 tablet 2   ondansetron (ZOFRAN) 4 MG tablet Take 1 tablet (4 mg total) by mouth every 8 (eight) hours as needed  for nausea or vomiting. 20 tablet 0   Vitamin D, Ergocalciferol, (DRISDOL) 1.25 MG (50000 UNIT) CAPS capsule Take 1 capsule (50,000 Units total) by mouth every 7 (seven) days. 8 capsule 4   [DISCONTINUED] metoprolol succinate (TOPROL-XL) 25 MG 24 hr tablet Take 1 tablet (25 mg total) by mouth daily. 30 tablet 3   [DISCONTINUED] sertraline (ZOLOFT) 25 MG tablet Take 1 tablet (25 mg total) by mouth daily. (Patient not taking: No sig reported) 30 tablet 0   No current facility-administered medications on file prior to visit.    BP (!) 140/90   Pulse 74   Temp 98.2 F (36.8 C)   Resp 18   Ht 6\' 1"  (1.854 m)   Wt 261 lb 12.8 oz (118.8 kg)   SpO2 99%   BMI 34.54 kg/m        Objective:   Physical Exam  General- No acute distress. Pleasant patient. Neck- Full range of motion, no jvd Lungs- Clear, even and unlabored. Heart- regular  rate and rhythm. Neurologic- CNII- XII grossly intact.  Heent- in mouth no lesions presently.  Hands- small papular bumps to palms of hands. Few red bumps on side of feet. Penis- just under gland on lesion red and raised. Also one on chaft of penis. Neither of these area looks like ulcers.  Elbows- scattered papules on elbows.      Assessment & Plan:   Patient Instructions  New onset rash and  lesions. Few/scant in mouth only number 1, many on hand and scant on feet. In additon penile lesion.  Will get rpr, hiv and urine ancillary studies.  Pending results will rx hydroxyzine for itching.  Discussed differential diagnosis. Not idealy matching hand, foot and mouth.  Follow up in 10-14 days or sooner if needed.    03-28-1979, PA-C

## 2022-01-31 NOTE — Patient Instructions (Addendum)
New onset rash and  lesions. Few/scant in mouth only number 1, many on hand and scant on feet. In addition penile lesion.  Will get rpr, hiv and urine ancillary studies.  Pending results will rx hydroxyzine for itching.  Discussed differential diagnosis. Not idealy matching hand, foot and mouth.  Follow up in 10-14 days or sooner if needed.

## 2022-02-01 ENCOUNTER — Encounter: Payer: Self-pay | Admitting: Medical

## 2022-02-01 LAB — RPR: RPR Ser Ql: NONREACTIVE

## 2022-02-01 LAB — HSV(HERPES SIMPLEX VRS) I + II AB-IGG
HAV 1 IGG,TYPE SPECIFIC AB: <0.9 index
HSV 2 IGG,TYPE SPECIFIC AB: <0.9 index

## 2022-02-01 LAB — HIV ANTIBODY (ROUTINE TESTING W REFLEX): HIV 1&2 Ab, 4th Generation: NONREACTIVE

## 2022-02-01 MED ORDER — METHYLPREDNISOLONE 4 MG PO TABS
ORAL_TABLET | ORAL | 0 refills | Status: DC
  Filled 2022-02-02: qty 21, 6d supply, fill #0

## 2022-02-01 MED ORDER — DOXYCYCLINE HYCLATE 100 MG PO TABS
100.0000 mg | ORAL_TABLET | Freq: Two times a day (BID) | ORAL | 0 refills | Status: DC
  Filled 2022-02-02 (×2): qty 20, 10d supply, fill #0

## 2022-02-01 NOTE — Addendum Note (Signed)
Addended by: Gwenevere Abbot on: 02/01/2022 09:14 PM   Modules accepted: Orders

## 2022-02-02 ENCOUNTER — Other Ambulatory Visit (HOSPITAL_BASED_OUTPATIENT_CLINIC_OR_DEPARTMENT_OTHER): Payer: Self-pay

## 2022-02-02 LAB — URINE CYTOLOGY ANCILLARY ONLY
Chlamydia: NEGATIVE
Comment: NEGATIVE
Comment: NEGATIVE
Comment: NORMAL
Neisseria Gonorrhea: NEGATIVE
Trichomonas: NEGATIVE

## 2022-02-02 MED ORDER — METHYLPREDNISOLONE 4 MG PO TBPK
ORAL_TABLET | ORAL | 0 refills | Status: DC
  Filled 2022-02-02 (×2): qty 21, 6d supply, fill #0

## 2022-02-06 ENCOUNTER — Other Ambulatory Visit: Payer: Self-pay | Admitting: *Deleted

## 2022-02-06 ENCOUNTER — Telehealth: Payer: Self-pay | Admitting: *Deleted

## 2022-02-06 DIAGNOSIS — E538 Deficiency of other specified B group vitamins: Secondary | ICD-10-CM

## 2022-02-06 NOTE — Addendum Note (Signed)
Addended by: Gwenevere Abbot on: 02/06/2022 03:31 PM   Modules accepted: Orders

## 2022-02-06 NOTE — Telephone Encounter (Signed)
Order placed for b12.  He will be getting both b12 and vit d done.  Vit d was never rechecked in 9 weeks.

## 2022-02-06 NOTE — Progress Notes (Signed)
Order placed for b12.  Patient needs both b12 and vitamin D.

## 2022-02-06 NOTE — Telephone Encounter (Signed)
Pt has lab appointment tomorrow. Scheduling notes says B12.  The only future order in place is for Vitamin D.  Please confirm what tests pt needs and place correct future orders.

## 2022-02-07 ENCOUNTER — Other Ambulatory Visit: Payer: No Typology Code available for payment source

## 2022-02-07 ENCOUNTER — Ambulatory Visit: Payer: No Typology Code available for payment source

## 2022-02-07 NOTE — Progress Notes (Deleted)
Tracy Rich is a 26 y.o. male presents to the office today for B12 injection, per physician's orders. Original order: on lab results from 06-30-21 to do once a week for one month and monthly for 5 months after that.  B12 given IM, *** (location) today. Patient tolerated injection. Patient due for follow up labs/provider appt: No.  Patient next injection due: 1 month- will wait to see what labs show before scheduling next nurse appointment.  [name], [name]

## 2022-02-09 ENCOUNTER — Other Ambulatory Visit (HOSPITAL_BASED_OUTPATIENT_CLINIC_OR_DEPARTMENT_OTHER): Payer: Self-pay

## 2022-02-14 ENCOUNTER — Other Ambulatory Visit: Payer: No Typology Code available for payment source

## 2022-02-14 ENCOUNTER — Ambulatory Visit: Payer: No Typology Code available for payment source

## 2022-03-10 ENCOUNTER — Other Ambulatory Visit (HOSPITAL_BASED_OUTPATIENT_CLINIC_OR_DEPARTMENT_OTHER): Payer: Self-pay

## 2022-03-13 ENCOUNTER — Encounter: Payer: Self-pay | Admitting: Medical

## 2022-03-13 MED ORDER — ALBUTEROL SULFATE HFA 108 (90 BASE) MCG/ACT IN AERS
2.0000 | INHALATION_SPRAY | Freq: Four times a day (QID) | RESPIRATORY_TRACT | 0 refills | Status: DC | PRN
  Filled 2022-03-15: qty 6.7, 30d supply, fill #0

## 2022-03-13 NOTE — Addendum Note (Signed)
Addended by: Gwenevere Abbot on: 03/13/2022 05:10 PM   Modules accepted: Orders

## 2022-03-15 ENCOUNTER — Other Ambulatory Visit (HOSPITAL_BASED_OUTPATIENT_CLINIC_OR_DEPARTMENT_OTHER): Payer: Self-pay

## 2022-03-27 ENCOUNTER — Other Ambulatory Visit (INDEPENDENT_AMBULATORY_CARE_PROVIDER_SITE_OTHER): Payer: No Typology Code available for payment source

## 2022-03-27 ENCOUNTER — Ambulatory Visit (INDEPENDENT_AMBULATORY_CARE_PROVIDER_SITE_OTHER): Payer: No Typology Code available for payment source

## 2022-03-27 DIAGNOSIS — E538 Deficiency of other specified B group vitamins: Secondary | ICD-10-CM

## 2022-03-27 DIAGNOSIS — E559 Vitamin D deficiency, unspecified: Secondary | ICD-10-CM | POA: Diagnosis not present

## 2022-03-27 LAB — VITAMIN D 25 HYDROXY (VIT D DEFICIENCY, FRACTURES): VITD: 24.29 ng/mL — ABNORMAL LOW (ref 30.00–100.00)

## 2022-03-27 LAB — VITAMIN B12: Vitamin B-12: 434 pg/mL (ref 211–911)

## 2022-03-27 MED ORDER — CYANOCOBALAMIN 1000 MCG/ML IJ SOLN
1000.0000 ug | Freq: Once | INTRAMUSCULAR | Status: AC
  Administered 2022-03-27: 1000 ug via INTRAMUSCULAR

## 2022-03-27 NOTE — Progress Notes (Signed)
Tracy Rich is a 26 y.o. male presents to the office today for B12 injections, per physician's orders.  Original order: on lab results from 06-30-21 to do once a week for one month and monthly for 5 months after that.  Patient wanted to reports he is no longer taking otc B12.  Cyanocobalamin (med), 1000 mg/ml (dose),  im (route) was administered right deltoid (location) today. Patient tolerated injection.   Patient next injection due: in one month and will wait until lab levels come back to schedule next b12 injection.  He will have B12 check labs have been drawn today.    Mackie Pai, PA-C

## 2022-03-28 ENCOUNTER — Encounter: Payer: Self-pay | Admitting: Medical

## 2022-03-28 MED ORDER — VITAMIN D (ERGOCALCIFEROL) 1.25 MG (50000 UNIT) PO CAPS
50000.0000 [IU] | ORAL_CAPSULE | ORAL | 0 refills | Status: DC
  Filled 2022-03-28: qty 8, 56d supply, fill #0

## 2022-03-28 NOTE — Addendum Note (Signed)
Addended by: Anabel Halon on: 03/28/2022 06:45 PM   Modules accepted: Orders

## 2022-03-28 NOTE — Addendum Note (Signed)
Addended by: Anabel Halon on: 03/28/2022 06:42 PM   Modules accepted: Orders

## 2022-03-29 ENCOUNTER — Other Ambulatory Visit (HOSPITAL_BASED_OUTPATIENT_CLINIC_OR_DEPARTMENT_OTHER): Payer: Self-pay

## 2022-04-10 ENCOUNTER — Other Ambulatory Visit (HOSPITAL_BASED_OUTPATIENT_CLINIC_OR_DEPARTMENT_OTHER): Payer: Self-pay

## 2022-04-17 ENCOUNTER — Encounter: Payer: Self-pay | Admitting: Medical

## 2022-04-17 ENCOUNTER — Ambulatory Visit (INDEPENDENT_AMBULATORY_CARE_PROVIDER_SITE_OTHER): Payer: No Typology Code available for payment source | Admitting: Medical

## 2022-04-17 ENCOUNTER — Other Ambulatory Visit (HOSPITAL_BASED_OUTPATIENT_CLINIC_OR_DEPARTMENT_OTHER): Payer: Self-pay

## 2022-04-17 VITALS — BP 131/83 | HR 79 | Temp 98.6°F | Ht 73.0 in | Wt 261.6 lb

## 2022-04-17 DIAGNOSIS — Z79899 Other long term (current) drug therapy: Secondary | ICD-10-CM | POA: Diagnosis not present

## 2022-04-17 DIAGNOSIS — F419 Anxiety disorder, unspecified: Secondary | ICD-10-CM | POA: Diagnosis not present

## 2022-04-17 MED ORDER — ESCITALOPRAM OXALATE 10 MG PO TABS
10.0000 mg | ORAL_TABLET | Freq: Every day | ORAL | 0 refills | Status: DC
  Filled 2022-04-17: qty 30, 30d supply, fill #0

## 2022-04-17 MED ORDER — CLONAZEPAM 1 MG PO TABS
1.0000 mg | ORAL_TABLET | Freq: Two times a day (BID) | ORAL | 1 refills | Status: DC | PRN
  Filled 2022-04-17: qty 60, 30d supply, fill #0
  Filled 2022-05-17: qty 60, 30d supply, fill #1

## 2022-04-17 NOTE — Addendum Note (Signed)
Addended by: Jeronimo Greaves on: 04/17/2022 02:02 PM   Modules accepted: Orders

## 2022-04-17 NOTE — Progress Notes (Signed)
Subjective:    Patient ID: Tracy Rich, male    DOB: [birth year], 26 y.o.   MRN: [id number]  HPI Pt in for anxiety. Pt wants to go up on med. He states he feels like he needs to. Pt wants to be referred to new psychiatrist.   Currently on clonazepam 1 mg twice daily.  Pt has been off med for 4 days.  Pt had tried sertraline and buspar did not help in the past.   On review looks like one provider had rx'd lexapro. Pt think he never tried.   Pt states mild nasal congestion since Saturday. He feels like almost over illness. Test - for covid. Various family member mild cold symptoms.      Review of Systems  Constitutional:  Negative for chills, diaphoresis and fever.  Respiratory:  Negative for cough, chest tightness, shortness of breath and wheezing.   Cardiovascular:  Negative for chest pain and palpitations.  Gastrointestinal:  Negative for constipation and nausea.  Musculoskeletal:  Negative for back pain, neck pain and neck stiffness.  Skin:  Negative for pallor.  Neurological:  Negative for dizziness, syncope, weakness, light-headedness and headaches.  Hematological:  Negative for adenopathy. Does not bruise/bleed easily.  Psychiatric/Behavioral:  Negative for behavioral problems and decreased concentration.     Past Medical History:  Diagnosis Date   Anxiety    Hydradenitis 01/03/2021   Patient states history of hidradenitis suppurativa     Social History   Socioeconomic History   Marital status: Single    Spouse name: Not on file   Number of children: Not on file   Years of education: Not on file   Highest education level: Not on file  Occupational History   Not on file  Tobacco Use   Smoking status: Former   Smokeless tobacco: Never  Vaping Use   Vaping Use: Never used  Substance and Sexual Activity   Alcohol use: No   Drug use: No   Sexual activity: Not on file  Other Topics Concern   Not on file  Social History Narrative   Caffeine- once every  other day   Social Determinants of Health   Financial Resource Strain: Not on file  Food Insecurity: Not on file  Transportation Needs: Not on file  Physical Activity: Not on file  Stress: Not on file  Social Connections: Not on file  Intimate Partner Violence: Not on file    Past Surgical History:  Procedure Laterality Date   TONSILLECTOMY     WISDOM TOOTH EXTRACTION      Family History  Problem Relation Age of Onset   Thyroid disease Mother    Cancer Mother    Hypertension Mother    Thyroid cancer Mother    GER disease Mother    Healthy Father    GER disease Father    Healthy Sister    Esophageal cancer Neg Hx    Colon cancer Neg Hx     No Known Allergies  Current Outpatient Medications on File Prior to Visit  Medication Sig Dispense Refill   albuterol (VENTOLIN HFA) 108 (90 Base) MCG/ACT inhaler Inhale 2 puffs into the lungs every 6 (six) hours as needed. 18 g 0   clonazePAM (KLONOPIN) 1 MG tablet Take 1 tablet (1 mg total) by mouth 2 (two) times daily as needed for anxiety. 60 tablet 2   Cyanocobalamin ([employer] VITAMIN B12 PO) Take 1 tablet by mouth daily.     doxycycline (VIBRA-TABS) 100 MG tablet  Take 1 tablet (100 mg total) by mouth 2 (two) times daily. 20 tablet 0   famotidine (PEPCID) 20 MG tablet Take 1 tablet (20 mg total) by mouth 2 (two) times daily. 60 tablet 2   hydrOXYzine (VISTARIL) 25 MG capsule Take 1 capsule (25 mg total) by mouth every 8 (eight) hours as needed for itching. 30 capsule 0   methylPREDNISolone (MEDROL DOSEPAK) 4 MG TBPK tablet Take as directed by mouth, see pack for directions 21 tablet 0   methylPREDNISolone (MEDROL) 4 MG tablet Take as directed by mouth, see pack for directions 21 tablet 0   ondansetron (ZOFRAN) 4 MG tablet Take 1 tablet (4 mg total) by mouth every 8 (eight) hours as needed for nausea or vomiting. 20 tablet 0   Vitamin D, Ergocalciferol, (DRISDOL) 1.25 MG (50000 UNIT) CAPS capsule Take 1 capsule (50,000 Units total) by  mouth every 7 (seven) days. 8 capsule 0   [DISCONTINUED] metoprolol succinate (TOPROL-XL) 25 MG 24 hr tablet Take 1 tablet (25 mg total) by mouth daily. 30 tablet 3   [DISCONTINUED] sertraline (ZOLOFT) 25 MG tablet Take 1 tablet (25 mg total) by mouth daily. (Patient not taking: No sig reported) 30 tablet 0   No current facility-administered medications on file prior to visit.    BP 131/83 (BP Location: Right Arm, Patient Position: Sitting, Cuff Size: Large)   Pulse 79   Temp 98.6 F (37 C) (Oral)   Ht 6\' 1"  (1.854 m)   Wt 261 lb 9.6 oz (118.7 kg)   SpO2 99%   BMI 34.51 kg/m        Objective:   Physical Exam  General- No acute distress. Pleasant patient. Neck- Full range of motion, no jvd Lungs- Clear, even and unlabored. Heart- regular rate and rhythm. Neurologic- CNII- XII grossly intact.       Assessment & Plan:   Patient Instructions  Anxiety- you report that you may need higher dose of of clonazepam. We discussed referral to psychiatrist. Gave you referral sheet. Contact one of the offices and let me know who you chose. Then will place referral. Refilling current clonazepam 1 mg twice daily dose and adding lexapro 10 mg daily  Currerntly not up to date on controlled contract or uds. Will update today.  Resolving uri vs allergies. Can use flonase otc at this point.  Vit d deficiency. Use weekly vit d rx and get scheduled to repeat level one week after you finish current vit D prescription.  Follow up date to be determined after it D level review. If you don't get in with psychiatrist would need controlled med visit in 6 months.      , PA-C

## 2022-04-17 NOTE — Patient Instructions (Addendum)
Anxiety- you report that you may need higher dose of of clonazepam. We discussed referral to psychiatrist. Gave you referral sheet. Contact one of the offices and let me know who you chose. Then will place referral. Refilling current clonazepam 1 mg twice daily dose and adding lexapro 10 mg daily  Currerntly not up to date on controlled contract or uds. Will update today.  Resolving uri vs allergies. Can use flonase otc at this point.  Vit d deficiency. Use weekly vit d rx and get scheduled to repeat level one week after you finish current vit D prescription.  Follow up date to be determined after it D level review. If you don't get in with psychiatrist would need controlled med visit in 6 months.

## 2022-04-19 ENCOUNTER — Encounter: Payer: Self-pay | Admitting: Medical

## 2022-04-19 LAB — DRUG MONITORING PANEL 376104, URINE
Alphahydroxyalprazolam: NEGATIVE ng/mL (ref <25)
Alphahydroxymidazolam: NEGATIVE ng/mL (ref <50)
Alphahydroxytriazolam: NEGATIVE ng/mL (ref <50)
Aminoclonazepam: 432 ng/mL — ABNORMAL HIGH (ref <25)
Amphetamines: NEGATIVE ng/mL (ref <500)
Barbiturates: NEGATIVE ng/mL (ref <300)
Benzodiazepines: POSITIVE ng/mL — AB (ref <100)
Cocaine Metabolite: NEGATIVE ng/mL (ref <150)
Desmethyltramadol: NEGATIVE ng/mL (ref <100)
Hydroxyethylflurazepam: NEGATIVE ng/mL (ref <50)
Lorazepam: NEGATIVE ng/mL (ref <50)
Nordiazepam: NEGATIVE ng/mL (ref <50)
Opiates: NEGATIVE ng/mL (ref <100)
Oxazepam: NEGATIVE ng/mL (ref <50)
Oxycodone: NEGATIVE ng/mL (ref <100)
Temazepam: NEGATIVE ng/mL (ref <50)
Tramadol: NEGATIVE ng/mL (ref <100)

## 2022-04-19 LAB — DM TEMPLATE

## 2022-04-20 ENCOUNTER — Encounter: Payer: Self-pay | Admitting: Medical

## 2022-04-22 MED ORDER — AZITHROMYCIN 250 MG PO TABS: ORAL_TABLET | ORAL | 0 refills | Status: DC

## 2022-04-22 NOTE — Addendum Note (Signed)
Addended by: Anabel Halon on: 04/22/2022 01:36 PM   Modules accepted: Orders

## 2022-04-24 ENCOUNTER — Encounter: Payer: Self-pay | Admitting: Medical

## 2022-04-24 MED ORDER — AZITHROMYCIN 250 MG PO TABS: ORAL_TABLET | ORAL | 0 refills | Status: AC

## 2022-05-17 ENCOUNTER — Other Ambulatory Visit (HOSPITAL_BASED_OUTPATIENT_CLINIC_OR_DEPARTMENT_OTHER): Payer: Self-pay

## 2022-05-31 ENCOUNTER — Encounter: Payer: Self-pay | Admitting: Medical

## 2022-06-01 ENCOUNTER — Telehealth (INDEPENDENT_AMBULATORY_CARE_PROVIDER_SITE_OTHER): Payer: No Typology Code available for payment source | Admitting: Medical

## 2022-06-01 DIAGNOSIS — R0981 Nasal congestion: Secondary | ICD-10-CM | POA: Diagnosis not present

## 2022-06-01 DIAGNOSIS — J0100 Acute maxillary sinusitis, unspecified: Secondary | ICD-10-CM | POA: Diagnosis not present

## 2022-06-01 MED ORDER — AZITHROMYCIN 250 MG PO TABS: ORAL_TABLET | ORAL | 0 refills | Status: DC

## 2022-06-01 MED ORDER — FLUTICASONE PROPIONATE 50 MCG/ACT NA SUSP: 2.0000 | Freq: Every day | NASAL | 1 refills | Status: DC

## 2022-06-01 MED ORDER — AMOXICILLIN-POT CLAVULANATE 875-125 MG PO TABS: 1.0000 | ORAL_TABLET | Freq: Two times a day (BID) | ORAL | 0 refills | Status: DC

## 2022-06-01 NOTE — Progress Notes (Signed)
   Subjective:    Patient ID: Tracy Rich, male    DOB: 06-21-1996, 26 y.o.   MRN: [id number]  HPI  Virtual Visit via Video Note  I connected with [name] on 06/01/22 at  3:00 PM EST by a video enabled telemedicine application and verified that I am speaking with the correct person using two identifiers.  Location: Patient: at work in [place]. Provider: office   I discussed the limitations of evaluation and management by telemedicine and the availability of in person appointments. The patient expressed understanding and agreed to proceed.  History of Present Illness:   Pt states has moderate-severe nasal congested with in sinus pain . He had st and ha on Sunday. St and ha resolved. No has a lot of maxillary sinus pressure. When blows nose will blow out green mucus.   No fever, no chills or sweats. At home covid test negative on Sunday.  No coughing.    Observations/Objective: General-no acute distress, pleasant, oriented. Lungs- on inspection lungs appear unlabored. Neck- no tracheal deviation or jvd on inspection. Neuro- gross motor function appears intact.     Assessment and Plan: Patient Instructions  Your appear to have a sinus infection. I am prescribing  azithromycin antibiotic for the infection. To help with the nasal congestion I prescribed flonase nasal steroid.   Rest, hydrate, tylenol for fever.  Follow up in 7 days or as needed.    [name], PA-C   Follow Up Instructions:    I discussed the assessment and treatment plan with the patient. The patient was provided an opportunity to ask questions and all were answered. The patient agreed with the plan and demonstrated an understanding of the instructions.   The patient was advised to call back or seek an in-person evaluation if the symptoms worsen or if the condition fails to improve as anticipated.     [name], PA-C   Review of Systems     Objective:   Physical Exam         Assessment & Plan:

## 2022-06-02 ENCOUNTER — Encounter: Payer: Self-pay | Admitting: Medical

## 2022-06-02 MED ORDER — AZITHROMYCIN 250 MG PO TABS: ORAL_TABLET | ORAL | 0 refills | Status: AC

## 2022-06-02 NOTE — Patient Instructions (Signed)
Your appear to have a sinus infection. I am prescribing  azithromycin antibiotic for the infection. To help with the nasal congestion I prescribed flonase nasal steroid.   Rest, hydrate, tylenol for fever.  Follow up in 7 days or as needed.

## 2022-06-12 ENCOUNTER — Other Ambulatory Visit (HOSPITAL_BASED_OUTPATIENT_CLINIC_OR_DEPARTMENT_OTHER): Payer: Self-pay

## 2022-06-12 ENCOUNTER — Ambulatory Visit (INDEPENDENT_AMBULATORY_CARE_PROVIDER_SITE_OTHER): Payer: No Typology Code available for payment source | Admitting: Medical

## 2022-06-12 VITALS — BP 132/79 | HR 75 | Temp 98.2°F | Resp 18 | Ht 73.0 in | Wt 263.0 lb

## 2022-06-12 DIAGNOSIS — E559 Vitamin D deficiency, unspecified: Secondary | ICD-10-CM

## 2022-06-12 DIAGNOSIS — R17 Unspecified jaundice: Secondary | ICD-10-CM

## 2022-06-12 DIAGNOSIS — Z113 Encounter for screening for infections with a predominantly sexual mode of transmission: Secondary | ICD-10-CM | POA: Diagnosis not present

## 2022-06-12 DIAGNOSIS — S46811A Strain of other muscles, fascia and tendons at shoulder and upper arm level, right arm, initial encounter: Secondary | ICD-10-CM | POA: Diagnosis not present

## 2022-06-12 DIAGNOSIS — E538 Deficiency of other specified B group vitamins: Secondary | ICD-10-CM

## 2022-06-12 DIAGNOSIS — R7989 Other specified abnormal findings of blood chemistry: Secondary | ICD-10-CM

## 2022-06-12 MED ORDER — TIZANIDINE HCL 2 MG PO TABS
2.0000 mg | ORAL_TABLET | Freq: Every day | ORAL | 0 refills | Status: DC
  Filled 2022-06-12: qty 3, 3d supply, fill #0

## 2022-06-12 NOTE — Progress Notes (Signed)
Subjective:    Patient ID: Tracy Rich, male    DOB: 1995/12/31, 26 y.o.   MRN: [id number]  HPI  Pt states last Sunday(the 3rd)about one week ago had acute onset of bilateral trapezius area pain. Pt was not working that day. No accidents or falls. States pain came on Sunday night. Pain less than it was on Sunday. Since Monday last week his neck just feels sore.  Pt states some dizziness. But on discussion no vertigo   Pt borrowed lidocaine patch from friend and pt states helped. Today he states symptoms eased up and he almost canceled his appointment.   Video visit was done on 06-01-2022 treated sinus infection.  A/P "Your appear to have a sinus infection. I am prescribing  azithromycin antibiotic for the infection. To help with the nasal congestion I prescribed flonase nasal steroid.    Rest, hydrate, tylenol for fever.   Follow up in 7 days or as needed. "   Pt tested for covid 3 times over past weeks.   Pt took flexeril of friend. Helped some but he did not like side effects. He felt overally groggy.  Pt at end of exam wanted me to order extra lab. He expressed want comprehensive lab work.  He has anxiety and was expressing extra lab for reassurance purposes.   Review of Systems  Constitutional:  Negative for chills, fatigue and fever.  Respiratory:  Negative for cough, chest tightness, shortness of breath and wheezing.   Cardiovascular:  Negative for chest pain and palpitations.  Gastrointestinal:  Negative for abdominal pain, blood in stool, diarrhea, nausea and rectal pain.  Genitourinary:  Negative for dysuria, flank pain and frequency.  Musculoskeletal:  Negative for back pain, joint swelling and myalgias.  Neurological:  Negative for dizziness and headaches.       States vertigo feeling but no spinning reported.  Hematological:  Negative for adenopathy. Does not bruise/bleed easily.  Psychiatric/Behavioral:  Negative for behavioral problems, confusion,  hallucinations and sleep disturbance. The patient is nervous/anxious.     Past Medical History:  Diagnosis Date   Anxiety    Hydradenitis 01/03/2021   Patient states history of hidradenitis suppurativa     Social History   Socioeconomic History   Marital status: Single    Spouse name: Not on file   Number of children: Not on file   Years of education: Not on file   Highest education level: Not on file  Occupational History   Not on file  Tobacco Use   Smoking status: Former   Smokeless tobacco: Never  Vaping Use   Vaping Use: Never used  Substance and Sexual Activity   Alcohol use: No   Drug use: No   Sexual activity: Not on file  Other Topics Concern   Not on file  Social History Narrative   Caffeine- once every other day   Social Determinants of Health   Financial Resource Strain: Not on file  Food Insecurity: Not on file  Transportation Needs: Not on file  Physical Activity: Not on file  Stress: Not on file  Social Connections: Not on file  Intimate Partner Violence: Not on file    Past Surgical History:  Procedure Laterality Date   TONSILLECTOMY     WISDOM TOOTH EXTRACTION      Family History  Problem Relation Age of Onset   Thyroid disease Mother    Cancer Mother    Hypertension Mother    Thyroid cancer Mother  GER disease Mother    Healthy Father    GER disease Father    Healthy Sister    Esophageal cancer Neg Hx    Colon cancer Neg Hx     No Known Allergies  Current Outpatient Medications on File Prior to Visit  Medication Sig Dispense Refill   albuterol (VENTOLIN HFA) 108 (90 Base) MCG/ACT inhaler Inhale 2 puffs into the lungs every 6 (six) hours as needed. 18 g 0   clonazePAM (KLONOPIN) 1 MG tablet Take 1 tablet (1 mg total) by mouth 2 (two) times daily as needed for anxiety. 60 tablet 1   Cyanocobalamin ([employer] VITAMIN B12 PO) Take 1 tablet by mouth daily.     escitalopram (LEXAPRO) 10 MG tablet Take 1 tablet (10 mg total) by mouth daily.  30 tablet 0   famotidine (PEPCID) 20 MG tablet Take 1 tablet (20 mg total) by mouth 2 (two) times daily. 60 tablet 2   fluticasone (FLONASE) 50 MCG/ACT nasal spray Place 2 sprays into both nostrils daily. 16 g 1   hydrOXYzine (VISTARIL) 25 MG capsule Take 1 capsule (25 mg total) by mouth every 8 (eight) hours as needed for itching. 30 capsule 0   ondansetron (ZOFRAN) 4 MG tablet Take 1 tablet (4 mg total) by mouth every 8 (eight) hours as needed for nausea or vomiting. 20 tablet 0   Vitamin D, Ergocalciferol, (DRISDOL) 1.25 MG (50000 UNIT) CAPS capsule Take 1 capsule (50,000 Units total) by mouth every 7 (seven) days. 8 capsule 0   [DISCONTINUED] metoprolol succinate (TOPROL-XL) 25 MG 24 hr tablet Take 1 tablet (25 mg total) by mouth daily. 30 tablet 3   [DISCONTINUED] sertraline (ZOLOFT) 25 MG tablet Take 1 tablet (25 mg total) by mouth daily. (Patient not taking: No sig reported) 30 tablet 0   No current facility-administered medications on file prior to visit.    BP 132/79   Pulse 75   Temp 98.2 F (36.8 C)   Resp 18   Ht 6\' 1"  (1.854 m)   Wt 263 lb (119.3 kg)   SpO2 99%   BMI 34.70 kg/m        Objective:   Physical Exam  General Mental Status- Alert. General Appearance- Not in acute distress.   Skin General: Color- Normal Color. Moisture- Normal Moisture.  Neck No neck stiffness. Can turn head to rt and left. Looking upward neck feels better. Chin to chest mld tender but can do. Trapezius muscle very tight and tender to palpation.   Chest and Lung Exam Auscultation: Breath Sounds:-Normal.  Cardiovascular Auscultation:Rythm- Regular. Murmurs & Other Heart Sounds:Auscultation of the heart reveals- No Murmurs.  Neurologic Cranial Nerve exam:- CN III-XII intact(No nystagmus), symmetric smile. Drift Test:- No drift. Romberg Exam:- Negative.  Heal to Toe Gait exam:-Normal. Finger to Nose:- Normal/Intact Strength:- 5/5 equal and symmetric strength both upper and lower  extremities.  Lying supine- turning head to left and right no dizziness.  On going from supine to sitting no dizziness.      Assessment & Plan:   Patient Instructions  You do appear to have very tight trapezius muscles bilateral. More on left side and you note history of left trapezius that can easily be strained. Recent flexeril with less symptoms but very groggy side effect. Avoid flexeril and will rx tizanadine low dose. Use just at night and use only for 3 nights. If you feel this excessively sedates you let me know.  You mentioned you almost did not come in  since already feeling better. Give me update on how you feel after tizanadine use. Can use plain   Normal neurologic exam.   If worse neck pain/stiffness, ha or neurologic signs/symptoms be seen in the ED.  Did decide to add cbc, cmp lab today.   For vit d deficency and vit b12 deficiency order those levels today.  Follow up one week or sooner if needed.   [name], PA-C    Not to take clonazepam with muscle relaxant.  Time spent with patient today was 46  minutes which consisted of chart review, discussing diagnosis, work up treatment and documentation.

## 2022-06-12 NOTE — Patient Instructions (Addendum)
You do appear to have very tight trapezius muscles bilateral. More on left side and you note history of left trapezius that can easily be strained. Recent flexeril with less symptoms but very groggy side effect. Avoid flexeril and will rx tizanadine low dose. Use just at night and use only for 3 nights. If you feel this excessively sedates you let me know.  You mentioned you almost did not come in since already feeling better. Give me update on how you feel after tizanadine use. Can use plain   Normal neurologic exam.   If worse neck pain/stiffness, ha or neurologic signs/symptoms be seen in the ED.  Did decide to add cbc, cmp lab today.   For vit d deficency and vit b12 deficiency order those levels today.  Follow up one week or sooner if needed.

## 2022-06-13 ENCOUNTER — Encounter: Payer: Self-pay | Admitting: Medical

## 2022-06-13 LAB — COMPREHENSIVE METABOLIC PANEL
ALT: 44 U/L (ref 0–53)
AST: 29 U/L (ref 0–37)
Albumin: 4.7 g/dL (ref 3.5–5.2)
Alkaline Phosphatase: 95 U/L (ref 39–117)
BUN: 11 mg/dL (ref 6–23)
CO2: 30 mEq/L (ref 19–32)
Calcium: 9.5 mg/dL (ref 8.4–10.5)
Chloride: 102 mEq/L (ref 96–112)
Creatinine, Ser: 0.96 mg/dL (ref 0.40–1.50)
GFR: 109.98 mL/min (ref >60.00)
Glucose, Bld: 93 mg/dL (ref 70–99)
Potassium: 4.7 mEq/L (ref 3.5–5.1)
Sodium: 138 mEq/L (ref 135–145)
Total Bilirubin: 1.1 mg/dL (ref 0.2–1.2)
Total Protein: 7.4 g/dL (ref 6.0–8.3)

## 2022-06-13 LAB — CBC WITH DIFFERENTIAL/PLATELET
Basophils Absolute: 0.1 10*3/uL (ref 0.0–0.1)
Basophils Relative: 1.4 % (ref 0.0–3.0)
Eosinophils Absolute: 0.3 10*3/uL (ref 0.0–0.7)
Eosinophils Relative: 4.6 % (ref 0.0–5.0)
HCT: 43.3 % (ref 39.0–52.0)
Hemoglobin: 14.8 g/dL (ref 13.0–17.0)
Lymphocytes Relative: 28.4 % (ref 12.0–46.0)
Lymphs Abs: 1.5 10*3/uL (ref 0.7–4.0)
MCHC: 34.1 g/dL (ref 30.0–36.0)
MCV: 90.8 fl (ref 78.0–100.0)
Monocytes Absolute: 0.7 10*3/uL (ref 0.1–1.0)
Monocytes Relative: 13 % — ABNORMAL HIGH (ref 3.0–12.0)
Neutro Abs: 2.8 10*3/uL (ref 1.4–7.7)
Neutrophils Relative %: 52.6 % (ref 43.0–77.0)
Platelets: 285 10*3/uL (ref 150.0–400.0)
RBC: 4.77 Mil/uL (ref 4.22–5.81)
RDW: 12.5 % (ref 11.5–15.5)
WBC: 5.4 10*3/uL (ref 4.0–10.5)

## 2022-06-13 LAB — VITAMIN B12: Vitamin B-12: 655 pg/mL (ref 211–911)

## 2022-06-13 LAB — HIV ANTIBODY (ROUTINE TESTING W REFLEX): HIV 1&2 Ab, 4th Generation: NONREACTIVE

## 2022-06-14 ENCOUNTER — Encounter: Payer: Self-pay | Admitting: Medical

## 2022-06-14 NOTE — Addendum Note (Signed)
Addended by: Mervin Kung A on: 06/14/2022 03:36 PM   Modules accepted: Orders

## 2022-06-15 ENCOUNTER — Encounter: Payer: Self-pay | Admitting: Medical

## 2022-06-15 NOTE — Telephone Encounter (Signed)
Requesting: klonopin Contract:04/07/22 UDS:04/17/22 Last Visit:06/12/22 Next Visit:n/a Last Refill:04/17/22  Please Advise

## 2022-06-16 MED ORDER — CLONAZEPAM 1 MG PO TABS: 1.0000 mg | ORAL_TABLET | Freq: Two times a day (BID) | ORAL | 0 refills | Status: DC | PRN

## 2022-06-16 NOTE — Telephone Encounter (Signed)
Rx sent to pharmacy   

## 2022-06-18 ENCOUNTER — Other Ambulatory Visit: Payer: Self-pay | Admitting: Medical

## 2022-06-19 ENCOUNTER — Encounter: Payer: Self-pay | Admitting: Medical

## 2022-06-19 ENCOUNTER — Other Ambulatory Visit: Payer: Self-pay | Admitting: Medical

## 2022-06-19 ENCOUNTER — Other Ambulatory Visit (HOSPITAL_BASED_OUTPATIENT_CLINIC_OR_DEPARTMENT_OTHER): Payer: Self-pay

## 2022-06-20 ENCOUNTER — Other Ambulatory Visit (HOSPITAL_BASED_OUTPATIENT_CLINIC_OR_DEPARTMENT_OTHER): Payer: Self-pay

## 2022-06-20 MED ORDER — CLONAZEPAM 1 MG PO TABS
1.0000 mg | ORAL_TABLET | Freq: Two times a day (BID) | ORAL | 0 refills | Status: DC | PRN
  Filled 2022-06-20: qty 60, 30d supply, fill #0

## 2022-06-20 MED ORDER — CLONAZEPAM 1 MG PO TABS: 1.0000 mg | ORAL_TABLET | Freq: Two times a day (BID) | ORAL | 0 refills | Status: DC | PRN

## 2022-06-20 NOTE — Addendum Note (Signed)
Addended by: Gwenevere Abbot on: 06/20/2022 10:28 AM   Modules accepted: Orders

## 2022-06-20 NOTE — Telephone Encounter (Signed)
On hold with [facility] for 6 mins ,hung up due to pt care

## 2022-06-20 NOTE — Telephone Encounter (Signed)
I accidentally sent the prescription to [facility] since [facility] was not loaded.  Please call them and cancel the prescription again.  Apologize for the double work.

## 2022-06-20 NOTE — Telephone Encounter (Signed)
Script cancelled at [facility] loaded

## 2022-06-20 NOTE — Telephone Encounter (Signed)
On hold with [facility] for 13 minutes hung up due to patient care

## 2022-06-21 ENCOUNTER — Ambulatory Visit: Payer: No Typology Code available for payment source | Admitting: Medical

## 2022-07-11 ENCOUNTER — Ambulatory Visit (INDEPENDENT_AMBULATORY_CARE_PROVIDER_SITE_OTHER): Payer: Self-pay | Admitting: Medical

## 2022-07-11 ENCOUNTER — Other Ambulatory Visit (HOSPITAL_BASED_OUTPATIENT_CLINIC_OR_DEPARTMENT_OTHER): Payer: Self-pay

## 2022-07-11 VITALS — BP 124/78 | HR 80 | Resp 18 | Ht 73.0 in | Wt 259.2 lb

## 2022-07-11 DIAGNOSIS — R21 Rash and other nonspecific skin eruption: Secondary | ICD-10-CM

## 2022-07-11 DIAGNOSIS — E559 Vitamin D deficiency, unspecified: Secondary | ICD-10-CM

## 2022-07-11 DIAGNOSIS — T7840XA Allergy, unspecified, initial encounter: Secondary | ICD-10-CM

## 2022-07-11 DIAGNOSIS — J029 Acute pharyngitis, unspecified: Secondary | ICD-10-CM

## 2022-07-11 MED ORDER — METHYLPREDNISOLONE 4 MG PO TBPK
ORAL_TABLET | ORAL | 0 refills | Status: DC
  Filled 2022-07-11: qty 21, 6d supply, fill #0

## 2022-07-11 MED ORDER — METHYLPREDNISOLONE ACETATE 40 MG/ML IJ SUSP
40.0000 mg | Freq: Once | INTRAMUSCULAR | Status: AC
  Administered 2022-07-11: 40 mg via INTRAMUSCULAR

## 2022-07-11 NOTE — Patient Instructions (Addendum)
Allergic reaction(etiology undetermined) vs atypical scarletina(note recent st 2 days ago).'  Rapid strep negative. Send out throat culture pending.  Depo medrol 40 mg IM and 6 day taper medrol.  If final strep test negative and signs/symptoms persist despite above measures then refer to dermatologist.  Followed up date to be determined after send out throat culture results and want you to update me by Friday morning or Monday morning.

## 2022-07-11 NOTE — Progress Notes (Unsigned)
Subjective:    Patient ID: Tracy Rich, male    DOB: [birth year], 27 y.o.   MRN: [id number]  HPI Pt has rash on chest, stomach, lower and some on face. On review some back rash as well. This has not been itching.   Pt does not report any st.  Diffuse rash for 2 weeks. No itching. Has been present for 2 weeks. No new creams soaps, detergents and no itching. No tick bites. No joint pains.    Pt is not diabetic.   Mild st 2 days ago. Slight itchy.   Review of Systems  Constitutional:  Negative for chills, fatigue and fever.  Respiratory:  Negative for cough, chest tightness, shortness of breath and wheezing.   Cardiovascular:  Negative for chest pain and palpitations.  Gastrointestinal:  Negative for abdominal pain.  Musculoskeletal:  Negative for back pain and myalgias.  Skin:  Positive for rash.  Neurological:  Negative for dizziness, seizures, speech difficulty, weakness and headaches.  Hematological:  Negative for adenopathy. Does not bruise/bleed easily.  Psychiatric/Behavioral:  Negative for behavioral problems, decreased concentration, hallucinations and suicidal ideas. The patient is not nervous/anxious.     Past Medical History:  Diagnosis Date   Anxiety    Hydradenitis 01/03/2021   Patient states history of hidradenitis suppurativa     Social History   Socioeconomic History   Marital status: Single    Spouse name: Not on file   Number of children: Not on file   Years of education: Not on file   Highest education level: Not on file  Occupational History   Not on file  Tobacco Use   Smoking status: Former   Smokeless tobacco: Never  Vaping Use   Vaping Use: Never used  Substance and Sexual Activity   Alcohol use: No   Drug use: No   Sexual activity: Not on file  Other Topics Concern   Not on file  Social History Narrative   Caffeine- once every other day   Social Determinants of Health   Financial Resource Strain: Not on file  Food Insecurity:  Not on file  Transportation Needs: Not on file  Physical Activity: Not on file  Stress: Not on file  Social Connections: Not on file  Intimate Partner Violence: Not on file    Past Surgical History:  Procedure Laterality Date   TONSILLECTOMY     WISDOM TOOTH EXTRACTION      Family History  Problem Relation Age of Onset   Thyroid disease Mother    Cancer Mother    Hypertension Mother    Thyroid cancer Mother    GER disease Mother    Healthy Father    GER disease Father    Healthy Sister    Esophageal cancer Neg Hx    Colon cancer Neg Hx     No Known Allergies  Current Outpatient Medications on File Prior to Visit  Medication Sig Dispense Refill   albuterol (VENTOLIN HFA) 108 (90 Base) MCG/ACT inhaler Inhale 2 puffs into the lungs every 6 (six) hours as needed. 18 g 0   clonazePAM (KLONOPIN) 1 MG tablet Take 1 tablet (1 mg total) by mouth 2 (two) times daily as needed for anxiety. 60 tablet 0   Cyanocobalamin ([employer] VITAMIN B12 PO) Take 1 tablet by mouth daily.     escitalopram (LEXAPRO) 10 MG tablet Take 1 tablet (10 mg total) by mouth daily. 30 tablet 0   famotidine (PEPCID) 20 MG tablet Take 1 tablet (  20 mg total) by mouth 2 (two) times daily. 60 tablet 2   fluticasone (FLONASE) 50 MCG/ACT nasal spray Place 2 sprays into both nostrils daily. 16 g 1   hydrOXYzine (VISTARIL) 25 MG capsule Take 1 capsule (25 mg total) by mouth every 8 (eight) hours as needed for itching. 30 capsule 0   ondansetron (ZOFRAN) 4 MG tablet Take 1 tablet (4 mg total) by mouth every 8 (eight) hours as needed for nausea or vomiting. 20 tablet 0   tiZANidine (ZANAFLEX) 2 MG tablet Take 1 tablet (2 mg total) by mouth daily at bedtime as needed for trapezius strain. 3 tablet 0   Vitamin D, Ergocalciferol, (DRISDOL) 1.25 MG (50000 UNIT) CAPS capsule Take 1 capsule (50,000 Units total) by mouth every 7 (seven) days. 8 capsule 0   [DISCONTINUED] metoprolol succinate (TOPROL-XL) 25 MG 24 hr tablet Take 1  tablet (25 mg total) by mouth daily. 30 tablet 3   [DISCONTINUED] sertraline (ZOLOFT) 25 MG tablet Take 1 tablet (25 mg total) by mouth daily. (Patient not taking: No sig reported) 30 tablet 0   No current facility-administered medications on file prior to visit.    BP 124/78   Pulse 80   Resp 18   Ht 6\' 1"  (1.854 m)   Wt 259 lb 3.2 oz (117.6 kg)   SpO2 95%   BMI 34.20 kg/m         Objective:   Physical Exam  General Mental Status- Alert. General Appearance- Not in acute distress.   Skin General: Color- Normal Color. Moisture- Normal Moisture.  Neck Carotid Arteries- Normal color. Moisture- Normal Moisture. No carotid bruits. No JVD.  Chest and Lung Exam Auscultation: Breath Sounds:-Normal.  Cardiovascular Auscultation:Rythm- Regular. Murmurs & Other Heart Sounds:Auscultation of the heart reveals- No Murmurs.   Neurologic Cranial Nerve exam:- CN III-XII intact(No nystagmus), symmetric smile. Strength:- 5/5 equal and symmetric strength both upper and lower extremities.   Skin- diffuse scattered raised rash on his skin. No warmth, no tenderness. No induration. Some areas blanch but not all.   Heent- faint red pharynx    Assessment & Plan:   Patient Instructions  Allergic reaction(etiology undetermined) vs atypical scarletina(note recent st 2 days ago).'  Rapid strep negative. Send out throat culture pending.  Depo medrol 40 mg IM and 6 day taper medrol.  If final strep test negative and signs/symptoms persist despite above measures then refer to dermatologist.  Followed up date to be determined after send out throat culture results and want you to update me by Friday morning or Monday morning.   Mackie Pai, PA-C

## 2022-07-12 LAB — VITAMIN D 25 HYDROXY (VIT D DEFICIENCY, FRACTURES): VITD: 26.82 ng/mL — ABNORMAL LOW (ref 30.00–100.00)

## 2022-07-13 ENCOUNTER — Other Ambulatory Visit (HOSPITAL_BASED_OUTPATIENT_CLINIC_OR_DEPARTMENT_OTHER): Payer: Self-pay

## 2022-07-13 LAB — CULTURE, GROUP A STREP
MICRO NUMBER:: 14407838
SPECIMEN QUALITY:: ADEQUATE

## 2022-07-13 MED ORDER — VITAMIN D (ERGOCALCIFEROL) 1.25 MG (50000 UNIT) PO CAPS
50000.0000 [IU] | ORAL_CAPSULE | ORAL | 0 refills | Status: DC
  Filled 2022-07-13: qty 8, 56d supply, fill #0
  Filled 2022-08-21: qty 7, 49d supply, fill #0
  Filled 2022-08-21: qty 1, 7d supply, fill #0
  Filled 2022-08-21: qty 8, 56d supply, fill #0

## 2022-07-13 NOTE — Addendum Note (Signed)
Addended by: Anabel Halon on: 07/13/2022 12:09 PM   Modules accepted: Orders

## 2022-07-21 ENCOUNTER — Other Ambulatory Visit (HOSPITAL_BASED_OUTPATIENT_CLINIC_OR_DEPARTMENT_OTHER): Payer: Self-pay

## 2022-07-24 ENCOUNTER — Other Ambulatory Visit (HOSPITAL_BASED_OUTPATIENT_CLINIC_OR_DEPARTMENT_OTHER): Payer: Self-pay

## 2022-07-24 ENCOUNTER — Encounter: Payer: Self-pay | Admitting: Medical

## 2022-07-24 MED ORDER — CLONAZEPAM 1 MG PO TABS
1.0000 mg | ORAL_TABLET | Freq: Two times a day (BID) | ORAL | 2 refills | Status: DC | PRN
  Filled 2022-07-24: qty 60, 30d supply, fill #0
  Filled 2022-08-21: qty 11, 6d supply, fill #1
  Filled 2022-08-21: qty 49, 24d supply, fill #1
  Filled 2022-08-21: qty 60, 30d supply, fill #1
  Filled 2022-09-18: qty 60, 30d supply, fill #2

## 2022-07-24 NOTE — Telephone Encounter (Signed)
Refills sent. Have him follow up with me in march.

## 2022-07-24 NOTE — Telephone Encounter (Signed)
Requesting: clonazepam 1mg   Contract: 04/17/22 UDS: 04/17/22 Last Visit: 07/11/22 Next Visit: None Last Refill: 06/20/22 #60 and 0RF   Please Advise

## 2022-07-25 ENCOUNTER — Other Ambulatory Visit (HOSPITAL_BASED_OUTPATIENT_CLINIC_OR_DEPARTMENT_OTHER): Payer: Self-pay

## 2022-07-25 DIAGNOSIS — L732 Hidradenitis suppurativa: Secondary | ICD-10-CM | POA: Diagnosis not present

## 2022-07-25 DIAGNOSIS — L309 Dermatitis, unspecified: Secondary | ICD-10-CM | POA: Diagnosis not present

## 2022-07-25 MED ORDER — TRIAMCINOLONE ACETONIDE 0.1 % EX CREA
1.0000 | TOPICAL_CREAM | Freq: Two times a day (BID) | CUTANEOUS | 0 refills | Status: DC
  Filled 2022-07-25: qty 454, 14d supply, fill #0

## 2022-07-25 MED ORDER — CLINDAMYCIN PHOSPHATE 1 % EX SOLN
1.0000 | Freq: Two times a day (BID) | CUTANEOUS | 3 refills | Status: DC
  Filled 2022-07-25: qty 60, 30d supply, fill #0
  Filled 2022-08-21: qty 60, 30d supply, fill #1

## 2022-07-26 ENCOUNTER — Ambulatory Visit (INDEPENDENT_AMBULATORY_CARE_PROVIDER_SITE_OTHER): Payer: 59 | Admitting: Medical

## 2022-07-26 ENCOUNTER — Other Ambulatory Visit (HOSPITAL_COMMUNITY)
Admission: RE | Admit: 2022-07-26 | Discharge: 2022-07-26 | Disposition: A | Discharge status: Home / Self Care | Payer: 59 | Source: Ambulatory Visit | Attending: Medical | Admitting: Medical

## 2022-07-26 ENCOUNTER — Other Ambulatory Visit (HOSPITAL_BASED_OUTPATIENT_CLINIC_OR_DEPARTMENT_OTHER): Payer: Self-pay

## 2022-07-26 VITALS — BP 128/80 | HR 68 | Resp 18 | Ht 73.0 in | Wt 261.0 lb

## 2022-07-26 DIAGNOSIS — R0981 Nasal congestion: Secondary | ICD-10-CM | POA: Diagnosis not present

## 2022-07-26 DIAGNOSIS — R21 Rash and other nonspecific skin eruption: Secondary | ICD-10-CM | POA: Diagnosis not present

## 2022-07-26 DIAGNOSIS — Z113 Encounter for screening for infections with a predominantly sexual mode of transmission: Secondary | ICD-10-CM

## 2022-07-26 MED ORDER — FLUTICASONE PROPIONATE 50 MCG/ACT NA SUSP
2.0000 | Freq: Every day | NASAL | 1 refills | Status: DC
  Filled 2022-07-26: qty 16, 30d supply, fill #0

## 2022-07-26 NOTE — Progress Notes (Signed)
Subjective:    Patient ID: Tracy Rich, male    DOB: 11-03-1995, 27 y.o.   MRN: [id number]  HPI  Pt in for follow up.  Pt saw dermatologist yesterday.   Saw dermatologist and thought post viral rash and gave clindamycin external solution  and triamcinolone cream.   Pt states dermatologist thought also best to get tested for std to make sure not cause.   Pt states was told if not better in 3 weeks to come back.  Pt has rt ear pain on and off. Mild nasal congestion last 2 days.   Review of Systems  Constitutional:  Negative for chills, fatigue and fever.  HENT:  Positive for congestion and ear pain. Negative for ear discharge, nosebleeds, postnasal drip, sinus pressure and sneezing.        Mild rt ear pain  Respiratory:  Negative for cough, chest tightness, shortness of breath and wheezing.   Cardiovascular:  Negative for chest pain and palpitations.  Gastrointestinal:  Negative for abdominal pain, blood in stool and diarrhea.  Musculoskeletal:  Negative for arthralgias, back pain and joint swelling.  Skin:  Positive for rash.  Neurological:  Negative for dizziness, syncope, numbness and headaches.  Hematological:  Negative for adenopathy. Does not bruise/bleed easily.  Psychiatric/Behavioral:  Negative for behavioral problems and decreased concentration.     Past Medical History:  Diagnosis Date   Anxiety    Hydradenitis 01/03/2021   Patient states history of hidradenitis suppurativa     Social History   Socioeconomic History   Marital status: Single    Spouse name: Not on file   Number of children: Not on file   Years of education: Not on file   Highest education level: Not on file  Occupational History   Not on file  Tobacco Use   Smoking status: Former   Smokeless tobacco: Never  Vaping Use   Vaping Use: Never used  Substance and Sexual Activity   Alcohol use: No   Drug use: No   Sexual activity: Not on file  Other Topics Concern   Not on file  Social  History Narrative   Caffeine- once every other day   Social Determinants of Health   Financial Resource Strain: Not on file  Food Insecurity: Not on file  Transportation Needs: Not on file  Physical Activity: Not on file  Stress: Not on file  Social Connections: Not on file  Intimate Partner Violence: Not on file    Past Surgical History:  Procedure Laterality Date   TONSILLECTOMY     WISDOM TOOTH EXTRACTION      Family History  Problem Relation Age of Onset   Thyroid disease Mother    Cancer Mother    Hypertension Mother    Thyroid cancer Mother    GER disease Mother    Healthy Father    GER disease Father    Healthy Sister    Esophageal cancer Neg Hx    Colon cancer Neg Hx     No Known Allergies  Current Outpatient Medications on File Prior to Visit  Medication Sig Dispense Refill   albuterol (VENTOLIN HFA) 108 (90 Base) MCG/ACT inhaler Inhale 2 puffs into the lungs every 6 (six) hours as needed. 18 g 0   clindamycin (CLEOCIN T) 1 % external solution Apply 1 application topically 2 (two) times daily. 60 mL 3   clonazePAM (KLONOPIN) 1 MG tablet Take 1 tablet (1 mg total) by mouth 2 (two) times daily as  needed for anxiety. 60 tablet 2   Cyanocobalamin ([employer] VITAMIN B12 PO) Take 1 tablet by mouth daily.     escitalopram (LEXAPRO) 10 MG tablet Take 1 tablet (10 mg total) by mouth daily. 30 tablet 0   famotidine (PEPCID) 20 MG tablet Take 1 tablet (20 mg total) by mouth 2 (two) times daily. 60 tablet 2   hydrOXYzine (VISTARIL) 25 MG capsule Take 1 capsule (25 mg total) by mouth every 8 (eight) hours as needed for itching. 30 capsule 0   methylPREDNISolone (MEDROL DOSEPAK) 4 MG TBPK tablet Take as directed on pack for 6 days. 21 each 0   ondansetron (ZOFRAN) 4 MG tablet Take 1 tablet (4 mg total) by mouth every 8 (eight) hours as needed for nausea or vomiting. 20 tablet 0   tiZANidine (ZANAFLEX) 2 MG tablet Take 1 tablet (2 mg total) by mouth daily at bedtime as needed for  trapezius strain. 3 tablet 0   triamcinolone cream (KENALOG) 0.1 % Apply 1 application topically 2 (two) times daily for 14 days. 454 g 0   Vitamin D, Ergocalciferol, (DRISDOL) 1.25 MG (50000 UNIT) CAPS capsule Take 1 capsule (50,000 Units total) by mouth every 7 (seven) days. 8 capsule 0   [DISCONTINUED] metoprolol succinate (TOPROL-XL) 25 MG 24 hr tablet Take 1 tablet (25 mg total) by mouth daily. 30 tablet 3   [DISCONTINUED] sertraline (ZOLOFT) 25 MG tablet Take 1 tablet (25 mg total) by mouth daily. (Patient not taking: No sig reported) 30 tablet 0   No current facility-administered medications on file prior to visit.    BP 128/80   Pulse 68   Resp 18   Ht 6\' 1"  (1.854 m)   Wt 261 lb (118.4 kg)   SpO2 100%   BMI 34.43 kg/m        Objective:   Physical Exam  General Mental Status- Alert. General Appearance- Not in acute distress.   Skin General: Color- Normal Color. Moisture- Normal Moisture.  Neck Carotid Arteries- Normal color. Moisture- Normal Moisture. No carotid bruits. No JVD.  Chest and Lung Exam Auscultation: Breath Sounds:-Normal.  Cardiovascular Auscultation:Rythm- Regular. Murmurs & Other Heart Sounds:Auscultation of the heart reveals- No Murmurs.  Abdomen Inspection:-Inspeection Normal. Palpation/Percussion:Note:No mass. Palpation and Percussion of the abdomen reveal- Non Tender, Non Distended + BS, no rebound or guarding.  Neurologic Cranial Nerve exam:- CN III-XII intact(No nystagmus), symmetric smile. Strength:- 5/5 equal and symmetric strength both upper and lower extremities.      Assessment & Plan:   Rash and dermatologist advised probable post viral rash. Continue the kenalog cream and clindamycin ointment as advised.  Will go ahead and do the std screens.   Follow up with dermatologist in 3 weeks if rash persists. If any new or changing symptoms during the interim let me know.  Mackie Pai PA-C

## 2022-07-26 NOTE — Patient Instructions (Signed)
Rash and dermatologist advised probable post viral rash. Continue the kenalog cream and clindamycin ointment as advised.  Will go ahead and do the std screens.   Follow up with dermatologist in 3 weeks if rash persists. If any new or changing symptoms during the interim let me know.

## 2022-07-27 ENCOUNTER — Encounter: Payer: Self-pay | Admitting: Medical

## 2022-07-27 LAB — CBC WITH DIFFERENTIAL/PLATELET
Basophils Absolute: 0.1 10*3/uL (ref 0.0–0.1)
Basophils Relative: 0.9 % (ref 0.0–3.0)
Eosinophils Absolute: 0.4 10*3/uL (ref 0.0–0.7)
Eosinophils Relative: 5.3 % — ABNORMAL HIGH (ref 0.0–5.0)
HCT: 43.8 % (ref 39.0–52.0)
Hemoglobin: 14.7 g/dL (ref 13.0–17.0)
Lymphocytes Relative: 23.7 % (ref 12.0–46.0)
Lymphs Abs: 1.6 10*3/uL (ref 0.7–4.0)
MCHC: 33.5 g/dL (ref 30.0–36.0)
MCV: 91.2 fl (ref 78.0–100.0)
Monocytes Absolute: 0.6 10*3/uL (ref 0.1–1.0)
Monocytes Relative: 8.2 % (ref 3.0–12.0)
Neutro Abs: 4.3 10*3/uL (ref 1.4–7.7)
Neutrophils Relative %: 61.9 % (ref 43.0–77.0)
Platelets: 226 10*3/uL (ref 150.0–400.0)
RBC: 4.8 Mil/uL (ref 4.22–5.81)
RDW: 12.8 % (ref 11.5–15.5)
WBC: 6.9 10*3/uL (ref 4.0–10.5)

## 2022-07-28 ENCOUNTER — Ambulatory Visit (INDEPENDENT_AMBULATORY_CARE_PROVIDER_SITE_OTHER): Payer: 59 | Admitting: Medical

## 2022-07-28 VITALS — BP 124/74 | HR 75 | Temp 98.2°F | Resp 18 | Ht 73.0 in | Wt 258.0 lb

## 2022-07-28 DIAGNOSIS — A530 Latent syphilis, unspecified as early or late: Secondary | ICD-10-CM

## 2022-07-28 LAB — URINE CYTOLOGY ANCILLARY ONLY
Chlamydia: NEGATIVE
Comment: NEGATIVE
Comment: NEGATIVE
Comment: NORMAL
Neisseria Gonorrhea: NEGATIVE
Trichomonas: NEGATIVE

## 2022-07-28 LAB — T PALLIDUM AB: T Pallidum Abs: POSITIVE — AB

## 2022-07-28 LAB — RPR: RPR Ser Ql: REACTIVE — AB

## 2022-07-28 LAB — RPR TITER: RPR Titer: 1:128 {titer} — ABNORMAL HIGH

## 2022-07-28 LAB — HIV ANTIBODY (ROUTINE TESTING W REFLEX): HIV 1&2 Ab, 4th Generation: NONREACTIVE

## 2022-07-28 MED ORDER — PENICILLIN G BENZATHINE 2400000 UNIT/4ML IM SUSY
2.4000 10*6.[IU] | PREFILLED_SYRINGE | Freq: Once | INTRAMUSCULAR | Status: AC
  Administered 2022-07-28: 2400000 [IU] via INTRAMUSCULAR

## 2022-07-28 NOTE — Progress Notes (Signed)
Subjective:    Patient ID: Tracy Rich, male    DOB: December 20, 1995, 27 y.o.   MRN: [id number]  HPI  Pt in for follow up from last week.   He had rash recently that was diffuse and not responding to tx. Recenty had rash on back, abdomen and arms but not on palm or bottom of foot rash.   However in  August he did have rash on his hands which was thought possible hand, foot and mouth as did have some exposure. At that time his std testing including rpr were negative.  Yesterday repeat rpr + with  titer of 1:128.  Left 3rd digit- small slight area of tenderness.     Review of Systems  Constitutional:  Negative for diaphoresis, fatigue and fever.  Respiratory:  Negative for chest tightness, shortness of breath and wheezing.   Cardiovascular:  Negative for chest pain and palpitations.  Gastrointestinal:  Negative for abdominal pain.  Genitourinary:  Negative for dysuria, frequency, hematuria and penile pain.  Musculoskeletal:  Negative for back pain.  Skin:  Positive for rash.  Hematological:  Negative for adenopathy. Does not bruise/bleed easily.  Psychiatric/Behavioral:  Negative for confusion.     Past Medical History:  Diagnosis Date   Anxiety    Hydradenitis 01/03/2021   Patient states history of hidradenitis suppurativa     Social History   Socioeconomic History   Marital status: Single    Spouse name: Not on file   Number of children: Not on file   Years of education: Not on file   Highest education level: Not on file  Occupational History   Not on file  Tobacco Use   Smoking status: Former   Smokeless tobacco: Never  Vaping Use   Vaping Use: Never used  Substance and Sexual Activity   Alcohol use: No   Drug use: No   Sexual activity: Not on file  Other Topics Concern   Not on file  Social History Narrative   Caffeine- once every other day   Social Determinants of Health   Financial Resource Strain: Not on file  Food Insecurity: Not on file   Transportation Needs: Not on file  Physical Activity: Not on file  Stress: Not on file  Social Connections: Not on file  Intimate Partner Violence: Not on file    Past Surgical History:  Procedure Laterality Date   TONSILLECTOMY     WISDOM TOOTH EXTRACTION      Family History  Problem Relation Age of Onset   Thyroid disease Mother    Cancer Mother    Hypertension Mother    Thyroid cancer Mother    GER disease Mother    Healthy Father    GER disease Father    Healthy Sister    Esophageal cancer Neg Hx    Colon cancer Neg Hx     No Known Allergies  Current Outpatient Medications on File Prior to Visit  Medication Sig Dispense Refill   albuterol (VENTOLIN HFA) 108 (90 Base) MCG/ACT inhaler Inhale 2 puffs into the lungs every 6 (six) hours as needed. 18 g 0   clindamycin (CLEOCIN T) 1 % external solution Apply 1 application topically 2 (two) times daily. 60 mL 3   clonazePAM (KLONOPIN) 1 MG tablet Take 1 tablet (1 mg total) by mouth 2 (two) times daily as needed for anxiety. 60 tablet 2   Cyanocobalamin ([employer] VITAMIN B12 PO) Take 1 tablet by mouth daily.     escitalopram (  LEXAPRO) 10 MG tablet Take 1 tablet (10 mg total) by mouth daily. 30 tablet 0   famotidine (PEPCID) 20 MG tablet Take 1 tablet (20 mg total) by mouth 2 (two) times daily. 60 tablet 2   fluticasone (FLONASE) 50 MCG/ACT nasal spray Place 2 sprays into both nostrils daily. 16 g 1   hydrOXYzine (VISTARIL) 25 MG capsule Take 1 capsule (25 mg total) by mouth every 8 (eight) hours as needed for itching. 30 capsule 0   methylPREDNISolone (MEDROL DOSEPAK) 4 MG TBPK tablet Take as directed on pack for 6 days. 21 each 0   ondansetron (ZOFRAN) 4 MG tablet Take 1 tablet (4 mg total) by mouth every 8 (eight) hours as needed for nausea or vomiting. 20 tablet 0   tiZANidine (ZANAFLEX) 2 MG tablet Take 1 tablet (2 mg total) by mouth daily at bedtime as needed for trapezius strain. 3 tablet 0   triamcinolone cream (KENALOG) 0.1  % Apply 1 application topically 2 (two) times daily for 14 days. 454 g 0   Vitamin D, Ergocalciferol, (DRISDOL) 1.25 MG (50000 UNIT) CAPS capsule Take 1 capsule (50,000 Units total) by mouth every 7 (seven) days. 8 capsule 0   [DISCONTINUED] metoprolol succinate (TOPROL-XL) 25 MG 24 hr tablet Take 1 tablet (25 mg total) by mouth daily. 30 tablet 3   [DISCONTINUED] sertraline (ZOLOFT) 25 MG tablet Take 1 tablet (25 mg total) by mouth daily. (Patient not taking: No sig reported) 30 tablet 0   No current facility-administered medications on file prior to visit.    BP 124/74   Pulse 75   Temp 98.2 F (36.8 C)   Resp 18   Ht 6\' 1"  (1.854 m)   Wt 258 lb (117 kg)   SpO2 99%   BMI 34.04 kg/m        Objective:   Physical Exam  General- No acute distress. Pleasant patient. Neck- Full range of motion, no jvd Lungs- Clear, even and unlabored. Heart- regular rate and rhythm. Neurologic- CNII- XII grossly intact.       Assessment & Plan:   Patient Instructions  +Rpr with elevated titer. Benzathine penicillin G 2.4 million unit IM injection given today.  Appears early infection. Will refer to STI clniic for follow up. Presently when I had discussed PrEP he declined.   Got his partner appointment with [facility] for this coming Monday. Pt partner advised to follow up and give information.   Follow up as needed.   Mackie Pai, PA-C

## 2022-07-28 NOTE — Patient Instructions (Signed)
+  Rpr with elevated titer. Benzathine penicillin G 2.4 million unit IM injection given today.  Appears early infection. Will refer to STI clniic for follow up. Presently when I had discussed PrEP he declined.   Got his partner appointment with [facility] for this coming Monday. Pt partner advised to follow up and give information.   Follow up as needed.

## 2022-08-02 ENCOUNTER — Encounter: Payer: Self-pay | Admitting: *Deleted

## 2022-08-04 ENCOUNTER — Other Ambulatory Visit (HOSPITAL_BASED_OUTPATIENT_CLINIC_OR_DEPARTMENT_OTHER): Payer: Self-pay

## 2022-08-05 ENCOUNTER — Encounter: Payer: Self-pay | Admitting: Medical

## 2022-08-07 ENCOUNTER — Other Ambulatory Visit (HOSPITAL_BASED_OUTPATIENT_CLINIC_OR_DEPARTMENT_OTHER): Payer: Self-pay

## 2022-08-07 ENCOUNTER — Ambulatory Visit (INDEPENDENT_AMBULATORY_CARE_PROVIDER_SITE_OTHER): Payer: 59 | Admitting: Medical

## 2022-08-07 VITALS — BP 120/90 | HR 77 | Temp 98.0°F | Resp 18 | Ht 73.0 in | Wt 261.0 lb

## 2022-08-07 DIAGNOSIS — Z9189 Other specified personal risk factors, not elsewhere classified: Secondary | ICD-10-CM

## 2022-08-07 DIAGNOSIS — J3489 Other specified disorders of nose and nasal sinuses: Secondary | ICD-10-CM | POA: Diagnosis not present

## 2022-08-07 DIAGNOSIS — Z1159 Encounter for screening for other viral diseases: Secondary | ICD-10-CM

## 2022-08-07 DIAGNOSIS — R110 Nausea: Secondary | ICD-10-CM | POA: Diagnosis not present

## 2022-08-07 DIAGNOSIS — R109 Unspecified abdominal pain: Secondary | ICD-10-CM

## 2022-08-07 DIAGNOSIS — J029 Acute pharyngitis, unspecified: Secondary | ICD-10-CM | POA: Diagnosis not present

## 2022-08-07 DIAGNOSIS — J069 Acute upper respiratory infection, unspecified: Secondary | ICD-10-CM

## 2022-08-07 LAB — POCT RAPID STREP A (OFFICE): Rapid Strep A Screen: NEGATIVE

## 2022-08-07 LAB — POC COVID19 BINAXNOW: SARS Coronavirus 2 Ag: NEGATIVE

## 2022-08-07 MED ORDER — BENZONATATE 100 MG PO CAPS
100.0000 mg | ORAL_CAPSULE | Freq: Three times a day (TID) | ORAL | 0 refills | Status: DC | PRN
  Filled 2022-08-07: qty 30, 10d supply, fill #0

## 2022-08-07 MED ORDER — AZITHROMYCIN 250 MG PO TABS
ORAL_TABLET | ORAL | 0 refills | Status: AC
  Filled 2022-08-07: qty 6, 5d supply, fill #0

## 2022-08-07 NOTE — Addendum Note (Signed)
Addended by: Anabel Halon on: 08/07/2022 11:53 AM   Modules accepted: Orders

## 2022-08-07 NOTE — Telephone Encounter (Signed)
Pt has an appt today.  

## 2022-08-07 NOTE — Patient Instructions (Addendum)
Severe sore throat with redness. Concern for strep despite rapid test negative. Some sinus pressure as well.  Rx azithromycin antibiotic. Can use flonase for nasal congestion and benzonatate for cough.  Your bruise appears normal variant post injection.  Discussed transient rt side abdomen pain since yesterday. Went away and then came back.  Almost gone now. We discussed will closely. If becomes constant or worse get cbc and maybe imaging. Keep me update by my chart.  Follow up 7-10 days or sooner if needed.

## 2022-08-07 NOTE — Progress Notes (Addendum)
Subjective:    Patient ID: Tracy Rich, male    DOB: May 18, 1996, 27 y.o.   MRN: [id number]  HPI  Pt in with nasal congestion and pnd. Over past day his throat has become increasingly sore. Hurts to swallow his own saliva.   Pt states mucus is neon green. Some   He states throat pain is severe.    Pt also brings up he has large bruise from one of injections. He got 2 pcn infections as we did not have 2.4 million units.(For +rpr)  Also states since yesterday moring some mid rt side cramping pain on and office. Had yesterday one event. 10 minutes(brought up after AVS printed initialy)  Review of Systems  Constitutional:  Negative for chills, fatigue and fever.  Respiratory:  Negative for cough, chest tightness, shortness of breath and wheezing.   Cardiovascular:  Negative for chest pain and palpitations.  Gastrointestinal:  Positive for abdominal pain. Negative for blood in stool and nausea.       See hpi. And exam.  Musculoskeletal:  Negative for back pain.  Hematological:  Negative for adenopathy. Does not bruise/bleed easily.  Psychiatric/Behavioral:  Negative for behavioral problems and confusion.     Past Medical History:  Diagnosis Date   Anxiety    Hydradenitis 01/03/2021   Patient states history of hidradenitis suppurativa     Social History   Socioeconomic History   Marital status: Single    Spouse name: Not on file   Number of children: Not on file   Years of education: Not on file   Highest education level: Not on file  Occupational History   Not on file  Tobacco Use   Smoking status: Former   Smokeless tobacco: Never  Vaping Use   Vaping Use: Never used  Substance and Sexual Activity   Alcohol use: No   Drug use: No   Sexual activity: Not on file  Other Topics Concern   Not on file  Social History Narrative   Caffeine- once every other day   Social Determinants of Health   Financial Resource Strain: Not on file  Food Insecurity: Not on file   Transportation Needs: Not on file  Physical Activity: Not on file  Stress: Not on file  Social Connections: Not on file  Intimate Partner Violence: Not on file    Past Surgical History:  Procedure Laterality Date   TONSILLECTOMY     WISDOM TOOTH EXTRACTION      Family History  Problem Relation Age of Onset   Thyroid disease Mother    Cancer Mother    Hypertension Mother    Thyroid cancer Mother    GER disease Mother    Healthy Father    GER disease Father    Healthy Sister    Esophageal cancer Neg Hx    Colon cancer Neg Hx     No Known Allergies  Current Outpatient Medications on File Prior to Visit  Medication Sig Dispense Refill   albuterol (VENTOLIN HFA) 108 (90 Base) MCG/ACT inhaler Inhale 2 puffs into the lungs every 6 (six) hours as needed. 18 g 0   clindamycin (CLEOCIN T) 1 % external solution Apply 1 application topically 2 (two) times daily. 60 mL 3   clonazePAM (KLONOPIN) 1 MG tablet Take 1 tablet (1 mg total) by mouth 2 (two) times daily as needed for anxiety. 60 tablet 2   Cyanocobalamin ([employer] VITAMIN B12 PO) Take 1 tablet by mouth daily.  escitalopram (LEXAPRO) 10 MG tablet Take 1 tablet (10 mg total) by mouth daily. 30 tablet 0   famotidine (PEPCID) 20 MG tablet Take 1 tablet (20 mg total) by mouth 2 (two) times daily. 60 tablet 2   fluticasone (FLONASE) 50 MCG/ACT nasal spray Place 2 sprays into both nostrils daily. 16 g 1   hydrOXYzine (VISTARIL) 25 MG capsule Take 1 capsule (25 mg total) by mouth every 8 (eight) hours as needed for itching. 30 capsule 0   methylPREDNISolone (MEDROL DOSEPAK) 4 MG TBPK tablet Take as directed on pack for 6 days. 21 each 0   ondansetron (ZOFRAN) 4 MG tablet Take 1 tablet (4 mg total) by mouth every 8 (eight) hours as needed for nausea or vomiting. 20 tablet 0   tiZANidine (ZANAFLEX) 2 MG tablet Take 1 tablet (2 mg total) by mouth daily at bedtime as needed for trapezius strain. 3 tablet 0   triamcinolone cream (KENALOG) 0.1  % Apply 1 application topically 2 (two) times daily for 14 days. 454 g 0   Vitamin D, Ergocalciferol, (DRISDOL) 1.25 MG (50000 UNIT) CAPS capsule Take 1 capsule (50,000 Units total) by mouth every 7 (seven) days. 8 capsule 0   [DISCONTINUED] metoprolol succinate (TOPROL-XL) 25 MG 24 hr tablet Take 1 tablet (25 mg total) by mouth daily. 30 tablet 3   [DISCONTINUED] sertraline (ZOLOFT) 25 MG tablet Take 1 tablet (25 mg total) by mouth daily. (Patient not taking: No sig reported) 30 tablet 0   No current facility-administered medications on file prior to visit.    BP (!) 120/90   Pulse 77   Temp 98 F (36.7 C)   Resp 18   Ht 6\' 1"  (1.854 m)   Wt 261 lb (118.4 kg)   SpO2 98%   BMI 34.43 kg/m       Objective:   Physical Exam  General Mental Status- Alert. General Appearance- Not in acute distress.   Skin General: Color- Normal Color. Moisture- Normal Moisture.  Neck Carotid Arteries- Normal color. Moisture- Normal Moisture. No carotid bruits. No JVD.  Chest and Lung Exam Auscultation: Breath Sounds:-Normal.  Cardiovascular Auscultation:Rythm- Regular. Murmurs & Other Heart Sounds:Auscultation of the heart reveals- No Murmurs.  Abdomen Inspection:-Inspeection Normal. Palpation/Percussion:Note:No mass. Palpation and Percussion of the abdomen reveal- very faint rt side abd Tender, Non Distended + BS, no rebound or guarding.   Neurologic Cranial Nerve exam:- CN III-XII intact(No nystagmus), symmetric smile. Strength:- 5/5 equal and symmetric strength both upper and lower extremities.   Skin - rt buttock. 3-4 cm faint bruise. No redness or induration.   Heent- sounds moderate nasal congested. Maxillary sinus pressure. Posterior pharynx mild bright red. No tonsil hypertrophy.      Assessment & Plan:   Patient Instructions  Severe sore throat with redness. Concern for strep despite rapid test negative. Some sinus pressure as well.  Rx azithromycin antibiotic. Can use  flonase for nasal congestion and bezonatate for cough.  Your bruise appears normal variant post injection.  Follow up 7-10 days or sooner if needed.   Mackie Pai, PA-C

## 2022-08-09 ENCOUNTER — Ambulatory Visit: Payer: 59 | Admitting: Internal Medicine

## 2022-08-09 ENCOUNTER — Other Ambulatory Visit: Payer: Self-pay

## 2022-08-09 ENCOUNTER — Telehealth: Payer: Self-pay | Admitting: Medical

## 2022-08-09 ENCOUNTER — Other Ambulatory Visit (INDEPENDENT_AMBULATORY_CARE_PROVIDER_SITE_OTHER): Payer: 59

## 2022-08-09 DIAGNOSIS — R110 Nausea: Secondary | ICD-10-CM | POA: Diagnosis not present

## 2022-08-09 DIAGNOSIS — E559 Vitamin D deficiency, unspecified: Secondary | ICD-10-CM

## 2022-08-09 DIAGNOSIS — Z1159 Encounter for screening for other viral diseases: Secondary | ICD-10-CM | POA: Diagnosis not present

## 2022-08-09 DIAGNOSIS — Z9189 Other specified personal risk factors, not elsewhere classified: Secondary | ICD-10-CM

## 2022-08-09 DIAGNOSIS — R109 Unspecified abdominal pain: Secondary | ICD-10-CM

## 2022-08-09 NOTE — Addendum Note (Signed)
Addended by: Anabel Halon on: 08/09/2022 01:41 PM   Modules accepted: Orders

## 2022-08-09 NOTE — Telephone Encounter (Signed)
Pt called stating that his partner recently tested positive for Hepatitis C and was wondering if he could be tested as well.

## 2022-08-09 NOTE — Telephone Encounter (Signed)
Lab appt made

## 2022-08-09 NOTE — Addendum Note (Signed)
Addended by: Jeronimo Greaves on: 08/09/2022 02:26 PM   Modules accepted: Orders

## 2022-08-09 NOTE — Addendum Note (Signed)
Addended by: Manuela Schwartz on: 08/09/2022 02:30 PM   Modules accepted: Orders

## 2022-08-09 NOTE — Addendum Note (Signed)
Addended by: Anabel Halon on: 08/09/2022 01:47 PM   Modules accepted: Orders

## 2022-08-10 ENCOUNTER — Ambulatory Visit: Payer: 59 | Admitting: Medical

## 2022-08-10 LAB — HEPATITIS A ANTIBODY, TOTAL: Hepatitis A AB,Total: NONREACTIVE

## 2022-08-10 LAB — HEPATITIS B E ANTIBODY: Hep B E Ab: NONREACTIVE

## 2022-08-10 LAB — HEPATITIS B CORE ANTIBODY, TOTAL: Hep B Core Total Ab: NONREACTIVE

## 2022-08-10 LAB — HEPATITIS C ANTIBODY: Hepatitis C Ab: NONREACTIVE

## 2022-08-17 ENCOUNTER — Ambulatory Visit: Payer: 59 | Admitting: Internal Medicine

## 2022-08-21 ENCOUNTER — Ambulatory Visit (INDEPENDENT_AMBULATORY_CARE_PROVIDER_SITE_OTHER): Payer: 59 | Admitting: Medical

## 2022-08-21 ENCOUNTER — Other Ambulatory Visit (HOSPITAL_BASED_OUTPATIENT_CLINIC_OR_DEPARTMENT_OTHER): Payer: Self-pay

## 2022-08-21 VITALS — BP 125/77 | HR 66 | Temp 98.0°F | Resp 18 | Ht 73.0 in | Wt 264.0 lb

## 2022-08-21 DIAGNOSIS — J3489 Other specified disorders of nose and nasal sinuses: Secondary | ICD-10-CM

## 2022-08-21 DIAGNOSIS — R21 Rash and other nonspecific skin eruption: Secondary | ICD-10-CM

## 2022-08-21 DIAGNOSIS — R059 Cough, unspecified: Secondary | ICD-10-CM

## 2022-08-21 MED ORDER — FLUTICASONE PROPIONATE 50 MCG/ACT NA SUSP
2.0000 | Freq: Every day | NASAL | 1 refills | Status: DC
  Filled 2022-08-21: qty 16, 30d supply, fill #0

## 2022-08-21 MED ORDER — DOXYCYCLINE HYCLATE 100 MG PO TABS
100.0000 mg | ORAL_TABLET | Freq: Two times a day (BID) | ORAL | 0 refills | Status: DC
  Filled 2022-08-21: qty 20, 10d supply, fill #0

## 2022-08-21 MED ORDER — BENZONATATE 100 MG PO CAPS
100.0000 mg | ORAL_CAPSULE | Freq: Three times a day (TID) | ORAL | 0 refills | Status: DC | PRN
  Filled 2022-08-21: qty 30, 10d supply, fill #0

## 2022-08-21 NOTE — Progress Notes (Signed)
Subjective:    Patient ID: Tracy Rich, male    DOB: [birth year], 27 y.o.   MRN: [id number]  HPI  Pt in for rash on his chest. He expresses concern about rash since he knows he had +rpr.   Pt had positive rpr in past. Pt got 2 injections of pcn. 2.4 million units. Offered referral to ID. He made appointment with infectious 29 th of this months. He had st and chest congestion so he cancelled pt that was scheduled earlier.   Pt states some sinus pressure.  2 weeks I gave zpack for sore throat. Throat feels better now. But has some chest congestion now.    Review of Systems  Constitutional:  Negative for chills, fatigue and fever.  HENT:  Positive for congestion, sinus pressure and sinus pain. Negative for drooling and ear discharge.   Respiratory:  Negative for cough, chest tightness, shortness of breath and wheezing.   Cardiovascular:  Negative for chest pain and palpitations.  Gastrointestinal:  Negative for abdominal pain, anal bleeding and nausea.  Genitourinary:  Negative for dysuria, flank pain and frequency.  Musculoskeletal:  Negative for back pain, gait problem and joint swelling.  Skin:  Positive for rash.  Neurological:  Negative for dizziness and light-headedness.  Hematological:  Negative for adenopathy. Does not bruise/bleed easily.  Psychiatric/Behavioral:  Negative for behavioral problems and decreased concentration.     Past Medical History:  Diagnosis Date   Anxiety    Hydradenitis 01/03/2021   Patient states history of hidradenitis suppurativa     Social History   Socioeconomic History   Marital status: Single    Spouse name: Not on file   Number of children: Not on file   Years of education: Not on file   Highest education level: Not on file  Occupational History   Not on file  Tobacco Use   Smoking status: Former   Smokeless tobacco: Never  Vaping Use   Vaping Use: Never used  Substance and Sexual Activity   Alcohol use: No   Drug use: No    Sexual activity: Not on file  Other Topics Concern   Not on file  Social History Narrative   Caffeine- once every other day   Social Determinants of Health   Financial Resource Strain: Not on file  Food Insecurity: Not on file  Transportation Needs: Not on file  Physical Activity: Not on file  Stress: Not on file  Social Connections: Not on file  Intimate Partner Violence: Not on file    Past Surgical History:  Procedure Laterality Date   TONSILLECTOMY     WISDOM TOOTH EXTRACTION      Family History  Problem Relation Age of Onset   Thyroid disease Mother    Cancer Mother    Hypertension Mother    Thyroid cancer Mother    GER disease Mother    Healthy Father    GER disease Father    Healthy Sister    Esophageal cancer Neg Hx    Colon cancer Neg Hx     No Known Allergies  Current Outpatient Medications on File Prior to Visit  Medication Sig Dispense Refill   albuterol (VENTOLIN HFA) 108 (90 Base) MCG/ACT inhaler Inhale 2 puffs into the lungs every 6 (six) hours as needed. 18 g 0   clindamycin (CLEOCIN T) 1 % external solution Apply 1 application topically 2 (two) times daily. 60 mL 3   clonazePAM (KLONOPIN) 1 MG tablet Take 1 tablet (1  mg total) by mouth 2 (two) times daily as needed for anxiety. 60 tablet 2   Cyanocobalamin ([employer] VITAMIN B12 PO) Take 1 tablet by mouth daily.     escitalopram (LEXAPRO) 10 MG tablet Take 1 tablet (10 mg total) by mouth daily. 30 tablet 0   famotidine (PEPCID) 20 MG tablet Take 1 tablet (20 mg total) by mouth 2 (two) times daily. 60 tablet 2   hydrOXYzine (VISTARIL) 25 MG capsule Take 1 capsule (25 mg total) by mouth every 8 (eight) hours as needed for itching. 30 capsule 0   methylPREDNISolone (MEDROL DOSEPAK) 4 MG TBPK tablet Take as directed on pack for 6 days. 21 each 0   ondansetron (ZOFRAN) 4 MG tablet Take 1 tablet (4 mg total) by mouth every 8 (eight) hours as needed for nausea or vomiting. 20 tablet 0   tiZANidine (ZANAFLEX) 2 MG  tablet Take 1 tablet (2 mg total) by mouth daily at bedtime as needed for trapezius strain. 3 tablet 0   triamcinolone cream (KENALOG) 0.1 % Apply 1 application topically 2 (two) times daily for 14 days. 454 g 0   Vitamin D, Ergocalciferol, (DRISDOL) 1.25 MG (50000 UNIT) CAPS capsule Take 1 capsule (50,000 Units total) by mouth every 7 (seven) days. 8 capsule 0   [DISCONTINUED] metoprolol succinate (TOPROL-XL) 25 MG 24 hr tablet Take 1 tablet (25 mg total) by mouth daily. 30 tablet 3   [DISCONTINUED] sertraline (ZOLOFT) 25 MG tablet Take 1 tablet (25 mg total) by mouth daily. (Patient not taking: No sig reported) 30 tablet 0   No current facility-administered medications on file prior to visit.    BP 125/77   Pulse 66   Temp 98 F (36.7 C)   Resp 18   Ht 6' 1"$  (1.854 m)   Wt 264 lb (119.7 kg)   SpO2 99%   BMI 34.83 kg/m        Objective:   Physical Exam  General Mental Status- Alert. General Appearance- Not in acute distress.   Skin Faint red rash to lower rash. Left hand- on papule and one are small scab.  Neck Carotid Arteries- Normal color. Moisture- Normal Moisture. No carotid bruits. No JVD.  Chest and Lung Exam Auscultation: Breath Sounds:-Normal.  Cardiovascular Auscultation:Rythm- Regular. Murmurs & Other Heart Sounds:Auscultation of the heart reveals- No Murmurs.  Abdomen Inspection:-Inspeection Normal. Palpation/Percussion:Note:No mass. Palpation and Percussion of the abdomen reveal- Non Tender, Non Distended + BS, no rebound or guarding.  Neurologic Cranial Nerve exam:- CN III-XII intact(No nystagmus), symmetric smile. Strength:- 5/5 equal and symmetric strength both upper and lower extremities.   Heent- nasal congested and maxillary sinus pressure.    Assessment & Plan:   Patient Instructions  Your appear to have a sinus infection with some productive cough(lungs sound clear). I am prescribing doxycycline antibiotic for the infection. To help with  the nasal congestion I prescribed  flonase nasal steroid. For your associated cough, I prescribed cough medicine benzonate.  Hx of +rpr. Treated with 2.4 million units pcn. Should be adequate. However do keep you appointment with infectious disease specialist.  Rest, hydrate, tylenol for fever.  Follow up in 7 days or as needed.

## 2022-08-21 NOTE — Patient Instructions (Addendum)
Your appear to have persisting sinus infection with some productive cough(lungs sound clear). I am prescribing doxycycline antibiotic for the infection. To help with the nasal congestion I prescribed  flonase nasal steroid. For your associated cough, I prescribed cough medicine benzonate.  Hx of +rpr. Treated with 2.4 million units pcn. Should be adequate. However do keep you appointment with infectious disease specialist.  Rest, hydrate, tylenol for fever.  Follow up in 7 days or as needed.

## 2022-08-31 ENCOUNTER — Ambulatory Visit: Payer: 59 | Admitting: Infectious Diseases

## 2022-08-31 NOTE — Progress Notes (Deleted)
Patient Active Problem List   Diagnosis Date Noted   Anxiety 10/05/2021   Hydradenitis 01/03/2021    Patient's Medications  New Prescriptions   No medications on file  Previous Medications   ALBUTEROL (VENTOLIN HFA) 108 (90 BASE) MCG/ACT INHALER    Inhale 2 puffs into the lungs every 6 (six) hours as needed.   BENZONATATE (TESSALON) 100 MG CAPSULE    Take 1 capsule (100 mg total) by mouth 3 (three) times daily as needed for cough.   CLINDAMYCIN (CLEOCIN T) 1 % EXTERNAL SOLUTION    Apply 1 application topically 2 (two) times daily.   CLONAZEPAM (KLONOPIN) 1 MG TABLET    Take 1 tablet (1 mg total) by mouth 2 (two) times daily as needed for anxiety.   CYANOCOBALAMIN ([employer] VITAMIN B12 PO)    Take 1 tablet by mouth daily.   DOXYCYCLINE (VIBRA-TABS) 100 MG TABLET    Take 1 tablet (100 mg total) by mouth 2 (two) times daily.   ESCITALOPRAM (LEXAPRO) 10 MG TABLET    Take 1 tablet (10 mg total) by mouth daily.   FAMOTIDINE (PEPCID) 20 MG TABLET    Take 1 tablet (20 mg total) by mouth 2 (two) times daily.   FLUTICASONE (FLONASE) 50 MCG/ACT NASAL SPRAY    Place 2 sprays into both nostrils daily.   HYDROXYZINE (VISTARIL) 25 MG CAPSULE    Take 1 capsule (25 mg total) by mouth every 8 (eight) hours as needed for itching.   METHYLPREDNISOLONE (MEDROL DOSEPAK) 4 MG TBPK TABLET    Take as directed on pack for 6 days.   ONDANSETRON (ZOFRAN) 4 MG TABLET    Take 1 tablet (4 mg total) by mouth every 8 (eight) hours as needed for nausea or vomiting.   TIZANIDINE (ZANAFLEX) 2 MG TABLET    Take 1 tablet (2 mg total) by mouth daily at bedtime as needed for trapezius strain.   TRIAMCINOLONE CREAM (KENALOG) 0.1 %    Apply 1 application topically 2 (two) times daily for 14 days.   VITAMIN D, ERGOCALCIFEROL, (DRISDOL) 1.25 MG (50000 UNIT) CAPS CAPSULE    Take 1 capsule (50,000 Units total) by mouth every 7 (seven) days.  Modified Medications   No medications on file  Discontinued Medications   No  medications on file    Subjective: ***   Review of Systems: ROS  Past Medical History:  Diagnosis Date   Anxiety    Hydradenitis 01/03/2021   Patient states history of hidradenitis suppurativa   Past Surgical History:  Procedure Laterality Date   TONSILLECTOMY     WISDOM TOOTH EXTRACTION      Social History   Tobacco Use   Smoking status: Former   Smokeless tobacco: Never  Scientific laboratory technician Use: Never used  Substance Use Topics   Alcohol use: No   Drug use: No    Family History  Problem Relation Age of Onset   Thyroid disease Mother    Cancer Mother    Hypertension Mother    Thyroid cancer Mother    GER disease Mother    Healthy Father    GER disease Father    Healthy Sister    Esophageal cancer Neg Hx    Colon cancer Neg Hx     No Known Allergies  Health Maintenance  Topic Date Due   COVID-19 Vaccine (1) Never done   HPV VACCINES (1 - Male 2-dose series) Never done  DTaP/Tdap/Td (1 - Tdap) Never done   INFLUENZA VACCINE  10/01/2022 (Originally 01/31/2022)   Hepatitis C Screening  Completed   HIV Screening  Completed    Objective:  There were no vitals filed for this visit. There is no height or weight on file to calculate BMI.  Physical Exam Constitutional:      Appearance: Normal appearance.  HENT:     Head: Normocephalic and atraumatic.      Mouth: Mucous membranes are moist.  Eyes:    Conjunctiva/sclera: Conjunctivae normal.     Pupils: Pupils are equal, round, and reactive to light.   Cardiovascular:     Rate and Rhythm: Normal rate and regular rhythm.     Heart sounds: No murmur heard. No friction rub. No gallop.   Pulmonary:     Effort: Pulmonary effort is normal.     Breath sounds: Normal breath sounds.   Abdominal:     General: Non distended     Palpations: soft.   Musculoskeletal:        General: Normal range of motion.   Skin:    General: Skin is warm and dry.     Comments:  Neurological:     General: grossly non  focal     Mental Status: awake, alert and oriented to person, place, and time.   Psychiatric:        Mood and Affect: Mood normal.   Lab Results Lab Results  Component Value Date   WBC 6.9 07/26/2022   HGB 14.7 07/26/2022   HCT 43.8 07/26/2022   MCV 91.2 07/26/2022   PLT 226.0 07/26/2022    Lab Results  Component Value Date   CREATININE 0.96 06/12/2022   BUN 11 06/12/2022   NA 138 06/12/2022   K 4.7 06/12/2022   CL 102 06/12/2022   CO2 30 06/12/2022    Lab Results  Component Value Date   ALT 44 06/12/2022   AST 29 06/12/2022   ALKPHOS 95 06/12/2022   BILITOT 1.1 06/12/2022    Lab Results  Component Value Date   CHOL 145 03/17/2019   HDL 46.60 03/17/2019   LDLCALC 71 03/17/2019   TRIG 137.0 03/17/2019   CHOLHDL 3 03/17/2019   Lab Results  Component Value Date   LABRPR REACTIVE (A) 07/26/2022   RPRTITER 1:128 (H) 07/26/2022   No results found for: "HIV1RNAQUANT", "HIV1RNAVL", "CD4TABS"   Problem List Items Addressed This Visit   None   I have personally spent more than 70 minutes involved in face-to-face and non-face-to-face activities for this patient on the day of the visit. Professional time spent includes the following activities: Preparing to see the patient (review of tests), Obtaining and/or reviewing separately obtained history (admission/discharge record), Performing a medically appropriate examination and/or evaluation , Ordering medications/tests/procedures, referring and communicating with other health care professionals, Documenting clinical information in the EMR, Independently interpreting results (not separately reported), Communicating results to the patient/family/caregiver, Counseling and educating the patient/family/caregiver and Care coordination (not separately reported).   Wilber Oliphant, [facility] 08/31/2022, 1:58 PM

## 2022-09-18 ENCOUNTER — Other Ambulatory Visit (HOSPITAL_BASED_OUTPATIENT_CLINIC_OR_DEPARTMENT_OTHER): Payer: Self-pay

## 2022-10-19 ENCOUNTER — Other Ambulatory Visit (HOSPITAL_BASED_OUTPATIENT_CLINIC_OR_DEPARTMENT_OTHER): Payer: Self-pay

## 2022-10-19 ENCOUNTER — Telehealth: Payer: Self-pay | Admitting: Medical

## 2022-10-19 ENCOUNTER — Encounter: Payer: Self-pay | Admitting: Medical

## 2022-10-19 MED ORDER — FAMOTIDINE 20 MG PO TABS
20.0000 mg | ORAL_TABLET | Freq: Two times a day (BID) | ORAL | 2 refills | Status: DC
  Filled 2022-10-19: qty 60, 30d supply, fill #0
  Filled 2022-11-20: qty 60, 30d supply, fill #1
  Filled 2023-01-19: qty 60, 30d supply, fill #2

## 2022-10-19 NOTE — Telephone Encounter (Signed)
Actually he just needs 6 month controlled med follow up appt.

## 2022-10-19 NOTE — Telephone Encounter (Signed)
Prescription Request  10/19/2022  Is this a "Controlled Substance" medicine? Yes  LOV: 08/21/2022  What is the name of the medication or equipment?  clonazePAM (KLONOPIN) 1 MG tablet   Have you contacted your pharmacy to request a refill? No   Which pharmacy would you like this sent to?  [facility] [address], [address] [place] [address] Phone: [phone] Fax: [phone]    Patient notified that their request is being sent to the clinical staff for review and that they should receive a response within 2 business days.   Please advise at [facility] [phone]

## 2022-10-20 ENCOUNTER — Other Ambulatory Visit (HOSPITAL_BASED_OUTPATIENT_CLINIC_OR_DEPARTMENT_OTHER): Payer: Self-pay

## 2022-10-20 ENCOUNTER — Ambulatory Visit (INDEPENDENT_AMBULATORY_CARE_PROVIDER_SITE_OTHER): Payer: 59 | Admitting: Medical

## 2022-10-20 VITALS — BP 112/86 | HR 76 | Ht 73.0 in | Wt 264.0 lb

## 2022-10-20 DIAGNOSIS — R6884 Jaw pain: Secondary | ICD-10-CM

## 2022-10-20 DIAGNOSIS — R41840 Attention and concentration deficit: Secondary | ICD-10-CM | POA: Diagnosis not present

## 2022-10-20 DIAGNOSIS — M26629 Arthralgia of temporomandibular joint, unspecified side: Secondary | ICD-10-CM | POA: Diagnosis not present

## 2022-10-20 DIAGNOSIS — F419 Anxiety disorder, unspecified: Secondary | ICD-10-CM

## 2022-10-20 MED ORDER — CYCLOBENZAPRINE HCL 10 MG PO TABS
10.0000 mg | ORAL_TABLET | Freq: Every day | ORAL | 0 refills | Status: DC
  Filled 2022-10-20: qty 3, 3d supply, fill #0

## 2022-10-20 MED ORDER — AMOXICILLIN-POT CLAVULANATE 875-125 MG PO TABS
1.0000 | ORAL_TABLET | Freq: Two times a day (BID) | ORAL | 0 refills | Status: DC
  Filled 2022-10-20: qty 20, 10d supply, fill #0

## 2022-10-20 MED ORDER — DICLOFENAC SODIUM 75 MG PO TBEC
75.0000 mg | DELAYED_RELEASE_TABLET | Freq: Two times a day (BID) | ORAL | 0 refills | Status: DC
  Filled 2022-10-20: qty 20, 10d supply, fill #0

## 2022-10-20 MED ORDER — CLONAZEPAM 0.5 MG PO TABS
0.5000 mg | ORAL_TABLET | Freq: Two times a day (BID) | ORAL | 1 refills | Status: DC | PRN
  Filled 2022-10-20: qty 60, 30d supply, fill #0

## 2022-10-20 MED ORDER — CLONAZEPAM 1 MG PO TABS
1.0000 mg | ORAL_TABLET | Freq: Two times a day (BID) | ORAL | 1 refills | Status: DC | PRN
  Filled 2022-10-20: qty 19, 9d supply, fill #0
  Filled 2022-10-20 (×3): qty 60, 30d supply, fill #0
  Filled 2022-11-20: qty 60, 30d supply, fill #1

## 2022-10-20 NOTE — Telephone Encounter (Signed)
Please refer to other note

## 2022-10-20 NOTE — Patient Instructions (Addendum)
Jaw pain moderate to severe with hx of TMJ syndrome. Rx flexeril 10 mg to use 1 tab q hs for 3 nights. Rx diclofenac for pain. Due to pain mid jaw and weekend approaching decided rx augmentin antibiotic as tooth infection in differential.    Anxiety  Up to date on contract and uds. Refill clonazepam today.   Attention and concentration deficit by review of ADHD questioneer. Discussed potential options refer to attention specialist, low dose wellbutrin off lable or low dose adderal. With adderal have concerns may become more anxious. After discussion pt wants to try adderal low dose  After 5 pm today. Discussed will get you do new uds for adderal early next week. Then have you signs contract for adderall.   Follow up 5-7 days or sooner if needed. But give me update on how doing by Monday.  Temporomandibular Joint Syndrome  Temporomandibular joint syndrome (TMJ syndrome) is a condition that causes pain in the temporomandibular joints. These joints are located near your ears and allow your jaw to open and close. For people with TMJ syndrome, chewing, biting, or other movements of the jaw can be difficult or painful. TMJ syndrome is often mild and goes away within a few weeks. However, sometimes the condition becomes a long-term (chronic) problem. What are the causes? This condition may be caused by: Grinding your teeth or clenching your jaw. Some people do this when they are stressed. Arthritis. An injury to the jaw. A head or neck injury. Teeth or dentures that are not aligned well. In some cases, the cause of TMJ syndrome may not be known. What are the signs or symptoms? The most common symptom of this condition is aching pain on the side of the head in the area of the TMJ. Other symptoms may include: Pain when moving your jaw, such as when chewing or biting. Not being able to open your jaw all the way. Making a clicking sound when you open your mouth. Headache. Earache. Neck or  shoulder pain. How is this diagnosed? This condition may be diagnosed based on: Your symptoms and medical history. A physical exam. Your health care provider may check the range of motion of your jaw. Imaging tests, such as X-rays or an MRI. You may also need to see your dentist, who will check if your teeth and jaw are lined up correctly. How is this treated? TMJ syndrome often goes away on its own. If treatment is needed, it may include: Eating soft foods and applying ice or heat. Medicines to relieve pain or inflammation. Medicines or massage to relax the muscles. A splint, bite plate, or mouthpiece to prevent teeth grinding or jaw clenching. Relaxation techniques or counseling to help reduce stress. A therapy for pain in which an electrical current is applied to the nerves through the skin (transcutaneous electrical nerve stimulation). Acupuncture. This may help to relieve pain. Jaw surgery. This is rarely needed. Follow these instructions at home:  Eating and drinking Eat a soft diet if you are having trouble chewing. Avoid foods that require a lot of chewing. Do not chew gum. General instructions Take over-the-counter and prescription medicines only as told by your health care provider. If directed, put ice on the painful area. To do this: Put ice in a plastic bag. Place a towel between your skin and the bag. Leave the ice on for 20 minutes, 2-3 times a day. Remove the ice if your skin turns bright red. This is very important. If you cannot feel  pain, heat, or cold, you have a greater risk of damage to the area. Apply a warm, wet cloth (warm compress) to the painful area as told. Massage your jaw area and do any jaw stretching exercises as told by your health care provider. If you were given a splint, bite plate, or mouthpiece, wear it as told by your health care provider. Keep all follow-up visits. This is important. Where to find more information [employer]: [email or web address] Contact a health care provider if: You have trouble eating. You have new or worsening symptoms. Get help right away if: Your jaw locks. Summary Temporomandibular joint syndrome (TMJ syndrome) is a condition that causes pain in the temporomandibular joints. These joints are located near your ears and allow your jaw to open and close. TMJ syndrome is often mild and goes away within a few weeks. However, sometimes the condition becomes a long-term (chronic) problem. Symptoms include an aching pain on the side of the head in the area of the TMJ, pain when chewing or biting, and being unable to open your jaw all the way. You may also make a clicking sound when you open your mouth. TMJ syndrome often goes away on its own. If treatment is needed, it may include medicines to relieve pain, reduce inflammation, or relax the muscles. A splint, bite plate, or mouthpiece may also be used to prevent teeth grinding or jaw clenching. This information is not intended to replace advice given to you by your health care provider. Make sure you discuss any questions you have with your health care provider. Document Revised: 01/30/2021 Document Reviewed: 01/30/2021 Elsevier Patient Education  2023 [employer].

## 2022-10-20 NOTE — Telephone Encounter (Signed)
Pt notified via mychart message.

## 2022-10-20 NOTE — Addendum Note (Signed)
Addended by: Gwenevere Abbot on: 10/20/2022 05:19 PM   Modules accepted: Orders

## 2022-10-20 NOTE — Progress Notes (Signed)
Subjective:    Patient ID: Tracy Rich, male    DOB: 1996/04/20, 27 y.o.   MRN: [id number]  HPI  Pt in for for controlled me visit. Hx of anxiety.   Pt on clonazepam twice daily. He is up to date on contract and uds. Needs refill.  He also report Monday or Tuesday  woke up and yawned. He states when yawned heard a pop. Felt some warmth around his jaw area. He states mild to moderate pain that day but gradually as day progressed felt pain over jaw/tmj area. Now he feels like he can't open mouth fully.  Hurts to eat.   Hx of tmj pain intermittently in past. States at times past would open his mouth and describes hear pop/crepitus sounds  Pt also mentioned at end of interview he has always had hard time concentrating. Describes when he reads will just start day dreaming and not be able to comprehend what he reads. He states so bad he had to drop of regular school and attended [employer]. 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 He is worried about starting.     Review of Systems  Constitutional:  Negative for chills, fatigue and fever.  Respiratory:  Negative for cough, chest tightness, shortness of breath and wheezing.   Cardiovascular:  Negative for chest pain and palpitations.  Gastrointestinal:  Negative for abdominal pain, blood in stool and constipation.  Genitourinary:  Negative for dysuria.  Musculoskeletal:  Negative for back pain and joint swelling.  Neurological:  Negative for dizziness, tremors, seizures, speech difficulty, light-headedness and headaches.  Hematological:  Negative for adenopathy. Does not bruise/bleed easily.  Psychiatric/Behavioral:  Positive for decreased concentration. Negative for behavioral problems, confusion, sleep disturbance and suicidal ideas.      Past Medical History:  Diagnosis Date   Anxiety    Hydradenitis 01/03/2021   Patient states history of hidradenitis suppurativa     Social History   Socioeconomic  History   Marital status: Single    Spouse name: Not on file   Number of children: Not on file   Years of education: Not on file   Highest education level: Not on file  Occupational History   Not on file  Tobacco Use   Smoking status: Former   Smokeless tobacco: Never  Vaping Use   Vaping Use: Never used  Substance and Sexual Activity   Alcohol use: No   Drug use: No   Sexual activity: Not on file  Other Topics Concern   Not on file  Social History Narrative   Caffeine- once every other day   Social Determinants of Health   Financial Resource Strain: Not on file  Food Insecurity: Not on file  Transportation Needs: Not on file  Physical Activity: Not on file  Stress: Not on file  Social Connections: Not on file  Intimate Partner Violence: Not on file    Past Surgical History:  Procedure Laterality Date   TONSILLECTOMY     WISDOM TOOTH EXTRACTION      Family History  Problem Relation Age of Onset   Thyroid disease Mother    Cancer Mother    Hypertension Mother    Thyroid cancer Mother    GER disease Mother    Healthy Father    GER disease Father    Healthy Sister    Esophageal cancer Neg Hx    Colon cancer Neg Hx     No Known Allergies  Current Outpatient Medications  on File Prior to Visit  Medication Sig Dispense Refill   albuterol (VENTOLIN HFA) 108 (90 Base) MCG/ACT inhaler Inhale 2 puffs into the lungs every 6 (six) hours as needed. 18 g 0   benzonatate (TESSALON) 100 MG capsule Take 1 capsule (100 mg total) by mouth 3 (three) times daily as needed for cough. 30 capsule 0   clindamycin (CLEOCIN T) 1 % external solution Apply 1 application topically 2 (two) times daily. 60 mL 3   clonazePAM (KLONOPIN) 1 MG tablet Take 1 tablet (1 mg total) by mouth 2 (two) times daily as needed for anxiety. 60 tablet 2   Cyanocobalamin ([employer]S VITAMIN B12 PO) Take 1 tablet by mouth daily.     escitalopram (LEXAPRO) 10 MG tablet Take 1 tablet (10 mg total) by mouth daily. 30  tablet 0   famotidine (PEPCID) 20 MG tablet Take 1 tablet (20 mg total) by mouth 2 (two) times daily. 60 tablet 2   fluticasone (FLONASE) 50 MCG/ACT nasal spray Place 2 sprays into both nostrils daily. 16 g 1   hydrOXYzine (VISTARIL) 25 MG capsule Take 1 capsule (25 mg total) by mouth every 8 (eight) hours as needed for itching. 30 capsule 0   methylPREDNISolone (MEDROL DOSEPAK) 4 MG TBPK tablet Take as directed on pack for 6 days. 21 each 0   ondansetron (ZOFRAN) 4 MG tablet Take 1 tablet (4 mg total) by mouth every 8 (eight) hours as needed for nausea or vomiting. 20 tablet 0   triamcinolone cream (KENALOG) 0.1 % Apply 1 application topically 2 (two) times daily for 14 days. 454 g 0   Vitamin D, Ergocalciferol, (DRISDOL) 1.25 MG (50000 UNIT) CAPS capsule Take 1 capsule (50,000 Units total) by mouth every 7 (seven) days. 8 capsule 0   [DISCONTINUED] metoprolol succinate (TOPROL-XL) 25 MG 24 hr tablet Take 1 tablet (25 mg total) by mouth daily. 30 tablet 3   [DISCONTINUED] sertraline (ZOLOFT) 25 MG tablet Take 1 tablet (25 mg total) by mouth daily. (Patient not taking: No sig reported) 30 tablet 0   No current facility-administered medications on file prior to visit.    BP 112/86   Pulse 76   Ht 6\' 1"  (1.854 m)   Wt 264 lb (119.7 kg)   SpO2 99%   BMI 34.83 kg/m        Objective:   Physical Exam  General Mental Status- Alert. General Appearance- Not in acute distress.   Skin General: Color- Normal Color. Moisture- Normal Moisture.  Neck Carotid Arteries- Normal color. Moisture- Normal Moisture. No carotid bruits. No JVD.  Chest and Lung Exam Auscultation: Breath Sounds:-Normal.  Cardiovascular Auscultation:Rythm- Regular. Murmurs & Other Heart Sounds:Auscultation of the heart reveals- No Murmurs.  Abdomen Inspection:-Inspeection Normal. Palpation/Percussion:Note:No mass. Palpation and Percussion of the abdomen reveal- Non Tender, Non Distended + BS, no rebound or  guarding.  Neurologic Cranial Nerve exam:- CN III-XII intact(No nystagmus), symmetric smile. Strength:- 5/5 equal and symmetric strength both upper and lower extremities.       Assessment & Plan:   Jaw pain moderate to severe with hx of TMJ syndrome. Rx flexeril 10 mg to use 1 tab q hs for 3 nights. Rx diclofenac for pain. Due to pain mid jaw and weekend approaching decided rx augmentin antibiotic as tooth infection in differential.    Anxiety  Up to date on contract and uds. Refill clonazepam today.   Attention and concentration deficit by review of ADHD quesioneer. Discussed potential options refer to attention specialist,  low dose wellbutrin off lable or low dose adderal. With adderal have concerns may become more anxious.   After 5 pm today. Discussed will get you do new uds for adderal. Then have you signs contract for adderall.   Follow up 5-7 days or sooner if needed. But give me update on how doing by Monday.  [name], PA-C    Time spent with patient today was  44 minutes which consisted of chart review, discussing diagnosis, work up treatment and documentation.

## 2022-10-22 ENCOUNTER — Encounter: Payer: Self-pay | Admitting: Medical

## 2022-10-24 ENCOUNTER — Other Ambulatory Visit: Payer: 59

## 2022-10-24 ENCOUNTER — Other Ambulatory Visit: Payer: Self-pay

## 2022-10-24 DIAGNOSIS — F419 Anxiety disorder, unspecified: Secondary | ICD-10-CM

## 2022-10-24 DIAGNOSIS — Z79899 Other long term (current) drug therapy: Secondary | ICD-10-CM

## 2022-10-25 ENCOUNTER — Encounter: Payer: Self-pay | Admitting: Medical

## 2022-10-25 LAB — DRUG MONITORING PANEL 376104, URINE: Desmethyltramadol: NEGATIVE ng/mL (ref <100)

## 2022-10-26 LAB — DRUG MONITORING PANEL 376104, URINE
Alphahydroxyalprazolam: NEGATIVE ng/mL (ref <25)
Alphahydroxymidazolam: NEGATIVE ng/mL (ref <50)
Alphahydroxytriazolam: NEGATIVE ng/mL (ref <50)
Aminoclonazepam: 371 ng/mL — ABNORMAL HIGH (ref <25)
Amphetamines: NEGATIVE ng/mL (ref <500)
Barbiturates: NEGATIVE ng/mL (ref <300)
Benzodiazepines: POSITIVE ng/mL — AB (ref <100)
Cocaine Metabolite: NEGATIVE ng/mL (ref <150)
Desmethyltramadol: NEGATIVE ng/mL (ref <100)
Hydroxyethylflurazepam: NEGATIVE ng/mL (ref <50)
Lorazepam: NEGATIVE ng/mL (ref <50)
Nordiazepam: NEGATIVE ng/mL (ref <50)
Opiates: NEGATIVE ng/mL (ref <100)
Oxazepam: NEGATIVE ng/mL (ref <50)
Oxycodone: NEGATIVE ng/mL (ref <100)
Temazepam: NEGATIVE ng/mL (ref <50)
Tramadol: NEGATIVE ng/mL (ref <100)

## 2022-10-26 LAB — DM TEMPLATE

## 2022-10-26 NOTE — Telephone Encounter (Signed)
Patient would like to know if he needs to come in to sign the contract today. Please advise.

## 2022-10-27 ENCOUNTER — Other Ambulatory Visit (HOSPITAL_BASED_OUTPATIENT_CLINIC_OR_DEPARTMENT_OTHER): Payer: Self-pay

## 2022-10-27 MED ORDER — AMPHETAMINE-DEXTROAMPHET ER 20 MG PO CP24
20.0000 mg | ORAL_CAPSULE | ORAL | 0 refills | Status: DC
  Filled 2022-10-27: qty 30, 30d supply, fill #0

## 2022-10-27 NOTE — Addendum Note (Signed)
Addended by: Gwenevere Abbot on: 10/27/2022 04:59 PM   Modules accepted: Orders

## 2022-11-06 ENCOUNTER — Telehealth (INDEPENDENT_AMBULATORY_CARE_PROVIDER_SITE_OTHER): Payer: 59 | Admitting: Medical

## 2022-11-06 ENCOUNTER — Encounter: Payer: Self-pay | Admitting: Medical

## 2022-11-06 VITALS — Ht 73.0 in

## 2022-11-06 DIAGNOSIS — R41840 Attention and concentration deficit: Secondary | ICD-10-CM | POA: Diagnosis not present

## 2022-11-06 DIAGNOSIS — Z79899 Other long term (current) drug therapy: Secondary | ICD-10-CM

## 2022-11-06 DIAGNOSIS — R6884 Jaw pain: Secondary | ICD-10-CM | POA: Diagnosis not present

## 2022-11-06 NOTE — Patient Instructions (Addendum)
Attention and concentration deficit.  Significant improvement in both your work concentration and your studying with Adderall XR 20 mg.  Have you describe bit excessive stimulation and insomnia.  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  Recommend that you get scheduled next week to modify/sign new contract to be just Adderall 10 mg immediate release twice daily.  I think you can see how this works and make a decision sometimes not to use a second tablet if you feel overstimulated late in the day.  Jaw pain/TMJ resolved after use of Flexeril.  Follow up in one month or sooner if needed.

## 2022-11-06 NOTE — Progress Notes (Signed)
   Subjective:    Patient ID: Tracy Rich, male    DOB: [birth year], 27 y.o.   MRN: [id number]  HPI  Virtual Visit via Video Note  I connected with [name] on 11/06/22 at  2:20 PM EDT by a video enabled telemedicine application and verified that I am speaking with the correct person using two identifiers.  Location: Patient: Car in [place]. Parked. Provider: office   I discussed the limitations of evaluation and management by telemedicine and the availability of in person appointments. The patient expressed understanding and agreed to proceed.  History of Present Illness: Pt started aderall for ADD. He states he found it worked very well for both work and for his concentration studying. He did not take it for 2 days. He was taking medication at 10 am but then having insomnia not able to fall asleep until 5 am and then only able to sleep 2-3 hours.   Pt thinks he would like to use short acting form next month rather than extended release.   Pt states he found he did not have to continue read/re-read material.   Pt jaw pain/tmj area is now resolved after use of Flexeril and diclofenac.   Observations/Objective:  General-no acute distress, pleasant, oriented. Lungs- on inspection lungs appear unlabored. Neck- no tracheal deviation or jvd on inspection. Neuro- gross motor function appears intact.    Assessment and Plan: Patient Instructions  Attention and concentration deficit.  Significant improvement in both your work concentration and your studying with Adderall XR 20 mg.  Have you describe bit excessive stimulation and insomnia.  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  Recommend that you get scheduled next week to modify/sign new contract to be just Adderall 10 mg immediate release twice daily.  I think you can see how this works and make a decision sometimes not to use  a second tablet if you feel overstimulated late in the day.  Jaw pain/TMJ resolved after use of Flexeril.  Follow up in one month or sooner if needed.   Follow Up Instructions:    I discussed the assessment and treatment plan with the patient. The patient was provided an opportunity to ask questions and all were answered. The patient agreed with the plan and demonstrated an understanding of the instructions.   The patient was advised to call back or seek an in-person evaluation if the symptoms worsen or if the condition fails to improve as anticipated.     [name], PA-C   Review of Systems     Objective:   Physical Exam        Assessment & Plan:

## 2022-11-16 ENCOUNTER — Other Ambulatory Visit (HOSPITAL_BASED_OUTPATIENT_CLINIC_OR_DEPARTMENT_OTHER): Payer: Self-pay

## 2022-11-17 ENCOUNTER — Other Ambulatory Visit (HOSPITAL_BASED_OUTPATIENT_CLINIC_OR_DEPARTMENT_OTHER): Payer: Self-pay

## 2022-11-20 ENCOUNTER — Other Ambulatory Visit (HOSPITAL_BASED_OUTPATIENT_CLINIC_OR_DEPARTMENT_OTHER): Payer: Self-pay

## 2022-11-22 ENCOUNTER — Ambulatory Visit (INDEPENDENT_AMBULATORY_CARE_PROVIDER_SITE_OTHER): Payer: 59 | Admitting: Medical

## 2022-11-22 VITALS — BP 120/84 | HR 75 | Temp 98.3°F | Resp 18 | Ht 73.0 in | Wt 268.0 lb

## 2022-11-22 DIAGNOSIS — Z202 Contact with and (suspected) exposure to infections with a predominantly sexual mode of transmission: Secondary | ICD-10-CM

## 2022-11-22 DIAGNOSIS — Z8619 Personal history of other infectious and parasitic diseases: Secondary | ICD-10-CM | POA: Diagnosis not present

## 2022-11-22 DIAGNOSIS — F988 Other specified behavioral and emotional disorders with onset usually occurring in childhood and adolescence: Secondary | ICD-10-CM | POA: Diagnosis not present

## 2022-11-22 MED ORDER — PENICILLIN G BENZATHINE 2400000 UNIT/4ML IM SUSY
2.4000 10*6.[IU] | PREFILLED_SYRINGE | Freq: Once | INTRAMUSCULAR | Status: AC
Start: 2022-11-22 — End: 2022-11-22
  Administered 2022-11-22: 2400000 [IU] via INTRAMUSCULAR

## 2022-11-22 NOTE — Progress Notes (Signed)
Subjective:    Patient ID: Tracy Rich, male    DOB: [birth year], 27 y.o.   MRN: [id number]  HPI  Pt in for recent rash aobut one month ago that lasted for 10 days. What causes concern for him is that he has hx of syphillis and had syphilis rash in january. Now pt states has new partner and this new partner tested + for syphilis. Health department called 2 days ago and started his new partner tested +.    January he got syphilis and was treated. With pcn injection.   Pt mentioned about one month ago had rash on palms and on his chest. Now gone.  Pt test + in January and his titer was 1:128.  ADD- see last video visit note. Side effects with 20 mg xr. We discussed ir 10 mg twice.     Review of Systems  Constitutional:  Negative for chills, fatigue and fever.  Respiratory:  Negative for cough, chest tightness, shortness of breath and wheezing.   Cardiovascular:  Negative for chest pain and palpitations.  Gastrointestinal:  Negative for abdominal pain.  Genitourinary:  Negative for dysuria, flank pain, genital sores and penile pain.  Musculoskeletal:  Negative for back pain.    Past Medical History:  Diagnosis Date   Anxiety    Hydradenitis 01/03/2021   Patient states history of hidradenitis suppurativa     Social History   Socioeconomic History   Marital status: Single    Spouse name: Not on file   Number of children: Not on file   Years of education: Not on file   Highest education level: Not on file  Occupational History   Not on file  Tobacco Use   Smoking status: Former   Smokeless tobacco: Never  Vaping Use   Vaping Use: Never used  Substance and Sexual Activity   Alcohol use: No   Drug use: No   Sexual activity: Not on file  Other Topics Concern   Not on file  Social History Narrative   Caffeine- once every other day   Social Determinants of Health   Financial Resource Strain: Not on file  Food Insecurity: Not on file  Transportation Needs: Not  on file  Physical Activity: Not on file  Stress: Not on file  Social Connections: Not on file  Intimate Partner Violence: Not on file    Past Surgical History:  Procedure Laterality Date   TONSILLECTOMY     WISDOM TOOTH EXTRACTION      Family History  Problem Relation Age of Onset   Thyroid disease Mother    Cancer Mother    Hypertension Mother    Thyroid cancer Mother    GER disease Mother    Healthy Father    GER disease Father    Healthy Sister    Esophageal cancer Neg Hx    Colon cancer Neg Hx     No Known Allergies  Current Outpatient Medications on File Prior to Visit  Medication Sig Dispense Refill   clonazePAM (KLONOPIN) 1 MG tablet Take 1 tablet (1 mg total) by mouth 2 (two) times daily as needed for anxiety. 60 tablet 1   famotidine (PEPCID) 20 MG tablet Take 1 tablet (20 mg total) by mouth 2 (two) times daily. 60 tablet 2   methylPREDNISolone (MEDROL DOSEPAK) 4 MG TBPK tablet Take as directed on pack for 6 days. (Patient not taking: Reported on 11/06/2022) 21 each 0   Vitamin D, Ergocalciferol, (DRISDOL) 1.25 MG (50000  UNIT) CAPS capsule Take 1 capsule (50,000 Units total) by mouth every 7 (seven) days. (Patient not taking: Reported on 11/06/2022) 8 capsule 0   [DISCONTINUED] metoprolol succinate (TOPROL-XL) 25 MG 24 hr tablet Take 1 tablet (25 mg total) by mouth daily. 30 tablet 3   [DISCONTINUED] sertraline (ZOLOFT) 25 MG tablet Take 1 tablet (25 mg total) by mouth daily. (Patient not taking: No sig reported) 30 tablet 0   No current facility-administered medications on file prior to visit.    BP 120/84   Pulse 75   Temp 98.3 F (36.8 C)   Resp 18   Ht 6\' 1"  (1.854 m)   Wt 268 lb (121.6 kg)   SpO2 100%   BMI 35.36 kg/m        Objective:   Physical Exam  General Mental Status- Alert. General Appearance- Not in acute distress.   Skin No rash presently.   Neck Carotid Arteries- Normal color. Moisture- Normal Moisture. No carotid bruits. No  JVD.  Chest and Lung Exam Auscultation: Breath Sounds:-Normal.  Cardiovascular Auscultation:Rythm- Regular. Murmurs & Other Heart Sounds:Auscultation of the heart reveals- No Murmurs.  Abdomen Inspection:-Inspeection Normal. Palpation/Percussion:Note:No mass. Palpation and Percussion of the abdomen reveal- Non Tender, Non Distended + BS, no rebound or guarding.    Neurologic Cranial Nerve exam:- CN III-XII intact(No nystagmus), symmetric smile. Strength:- 5/5 equal and symmetric strength both upper and lower extremities.       Assessment & Plan:   Patient Instructions  1. History of syphilis and recent Exposure to syphilis with transient possible syphilis like rash. Bicillin LA 2.4 million units IM today. After lab review may refer you to ID as discussed last visit. - RPR - HIV Antibody (routine testing w rflx)  3. STD exposure - RPR - HIV Antibody (routine testing w rflx)  4. Attention deficit disorder, unspecified hyperactivity presence  Will rx adderal immediate release 10 mg twice daily when you run out of the 20 mg xr version.  Follow up date to be determined after lab review.   [name], PA-C

## 2022-11-22 NOTE — Patient Instructions (Signed)
1. History of syphilis and recent Exposure to syphilis with transient possible syphilis like rash. Bicillin LA 2.4 million units IM today. After lab review may refer you to ID as discussed last visit. - RPR - HIV Antibody (routine testing w rflx)  3. STD exposure - RPR - HIV Antibody (routine testing w rflx)  4. Attention deficit disorder, unspecified hyperactivity presence  Will rx adderal immediate release 10 mg twice daily when you run out of the 20 mg xr version.  Follow up date to be determined after lab review.

## 2022-11-23 LAB — HIV ANTIBODY (ROUTINE TESTING W REFLEX): HIV 1&2 Ab, 4th Generation: NONREACTIVE

## 2022-11-23 LAB — RPR TITER: RPR Titer: 1:8 {titer} — ABNORMAL HIGH

## 2022-11-23 LAB — RPR: RPR Ser Ql: REACTIVE — AB

## 2022-11-24 ENCOUNTER — Other Ambulatory Visit (HOSPITAL_BASED_OUTPATIENT_CLINIC_OR_DEPARTMENT_OTHER): Payer: Self-pay

## 2022-11-24 LAB — T PALLIDUM AB: T Pallidum Abs: POSITIVE — AB

## 2022-11-26 ENCOUNTER — Encounter: Payer: Self-pay | Admitting: Medical

## 2022-11-28 ENCOUNTER — Encounter: Payer: Self-pay | Admitting: Medical

## 2022-11-28 MED ORDER — AMPHETAMINE-DEXTROAMPHETAMINE 10 MG PO TABS
10.0000 mg | ORAL_TABLET | Freq: Two times a day (BID) | ORAL | 0 refills | Status: DC
  Filled 2022-11-28: qty 60, 30d supply, fill #0

## 2022-11-28 NOTE — Addendum Note (Signed)
Addended by: Gwenevere Abbot on: 11/28/2022 09:57 PM   Modules accepted: Orders

## 2022-11-29 ENCOUNTER — Telehealth: Payer: Self-pay | Admitting: Medical

## 2022-11-29 ENCOUNTER — Other Ambulatory Visit (HOSPITAL_BASED_OUTPATIENT_CLINIC_OR_DEPARTMENT_OTHER): Payer: Self-pay

## 2022-11-29 ENCOUNTER — Other Ambulatory Visit: Payer: Self-pay
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
## 2022-11-29 NOTE — Telephone Encounter (Signed)
Pcp replied to previous message at 9:59 pm , pt read other message as well

## 2022-11-30 NOTE — Telephone Encounter (Signed)
Pt notified via mychart

## 2022-12-19 ENCOUNTER — Encounter: Payer: Self-pay | Admitting: Medical

## 2022-12-20 NOTE — Telephone Encounter (Signed)
Requesting: clonzepam Contract:11/08/22 UDS:11/08/22 Last Visit:11/22/22 Next Visit:n/a Last Refill:10/20/22  Please Advise

## 2022-12-21 MED ORDER — CLONAZEPAM 1 MG PO TABS
1.0000 mg | ORAL_TABLET | Freq: Two times a day (BID) | ORAL | 1 refills | Status: DC | PRN
  Filled 2022-12-21: qty 60, 30d supply, fill #0
  Filled 2023-01-19: qty 60, 30d supply, fill #1

## 2022-12-21 MED ORDER — VITAMIN D (ERGOCALCIFEROL) 1.25 MG (50000 UNIT) PO CAPS
50000.0000 [IU] | ORAL_CAPSULE | ORAL | 0 refills | Status: DC
  Filled 2022-12-21: qty 8, 56d supply, fill #0

## 2022-12-21 NOTE — Telephone Encounter (Signed)
Rx refill clonazapam send to pharmacy. Up to date on uds and contract. Due for refill.

## 2022-12-22 ENCOUNTER — Other Ambulatory Visit: Payer: Self-pay

## 2022-12-22 ENCOUNTER — Other Ambulatory Visit (HOSPITAL_BASED_OUTPATIENT_CLINIC_OR_DEPARTMENT_OTHER): Payer: Self-pay

## 2023-01-01 ENCOUNTER — Encounter: Payer: Self-pay | Admitting: Medical

## 2023-01-01 ENCOUNTER — Other Ambulatory Visit (HOSPITAL_BASED_OUTPATIENT_CLINIC_OR_DEPARTMENT_OTHER): Payer: Self-pay

## 2023-01-01 MED ORDER — AMPHETAMINE-DEXTROAMPHETAMINE 10 MG PO TABS
10.0000 mg | ORAL_TABLET | Freq: Two times a day (BID) | ORAL | 0 refills | Status: DC
  Filled 2023-01-01: qty 60, 30d supply, fill #0

## 2023-01-01 NOTE — Telephone Encounter (Signed)
Requesting: adderall Contract:11/08/22 UDS:11/08/22 Last Visit:11/22/22 Next Visit:n/a Last Refill:11/28/22  Please Advise

## 2023-01-05 ENCOUNTER — Other Ambulatory Visit (HOSPITAL_BASED_OUTPATIENT_CLINIC_OR_DEPARTMENT_OTHER): Payer: Self-pay

## 2023-01-19 ENCOUNTER — Other Ambulatory Visit (HOSPITAL_BASED_OUTPATIENT_CLINIC_OR_DEPARTMENT_OTHER): Payer: Self-pay

## 2023-01-19 ENCOUNTER — Other Ambulatory Visit: Payer: Self-pay

## 2023-01-22 ENCOUNTER — Ambulatory Visit: Payer: 59 | Admitting: Medical

## 2023-01-23 ENCOUNTER — Encounter: Payer: 59 | Admitting: Medical

## 2023-02-05 ENCOUNTER — Encounter: Payer: 59 | Admitting: Medical

## 2023-02-12 ENCOUNTER — Encounter (HOSPITAL_COMMUNITY): Payer: Self-pay

## 2023-02-12 ENCOUNTER — Encounter (HOSPITAL_COMMUNITY)
Admission: EM | Disposition: A | Discharge status: Home / Self Care | Payer: Self-pay | Source: Home / Self Care | Attending: Emergency Medicine

## 2023-02-12 ENCOUNTER — Emergency Department (HOSPITAL_BASED_OUTPATIENT_CLINIC_OR_DEPARTMENT_OTHER): Payer: 59 | Admitting: Certified Registered Nurse Anesthetist

## 2023-02-12 ENCOUNTER — Emergency Department (HOSPITAL_COMMUNITY): Payer: 59

## 2023-02-12 ENCOUNTER — Emergency Department (HOSPITAL_COMMUNITY): Payer: 59 | Admitting: Certified Registered Nurse Anesthetist

## 2023-02-12 ENCOUNTER — Other Ambulatory Visit: Payer: Self-pay

## 2023-02-12 ENCOUNTER — Ambulatory Visit (HOSPITAL_COMMUNITY)
Admission: EM | Admit: 2023-02-12 | Discharge: 2023-02-12 | Disposition: A | Discharge status: Home / Self Care | Payer: 59 | Attending: Emergency Medicine | Admitting: Emergency Medicine

## 2023-02-12 ENCOUNTER — Other Ambulatory Visit (HOSPITAL_COMMUNITY): Payer: Self-pay

## 2023-02-12 DIAGNOSIS — K810 Acute cholecystitis: Secondary | ICD-10-CM | POA: Diagnosis present

## 2023-02-12 DIAGNOSIS — Z87891 Personal history of nicotine dependence: Secondary | ICD-10-CM | POA: Diagnosis not present

## 2023-02-12 DIAGNOSIS — R1011 Right upper quadrant pain: Secondary | ICD-10-CM | POA: Diagnosis not present

## 2023-02-12 DIAGNOSIS — K219 Gastro-esophageal reflux disease without esophagitis: Secondary | ICD-10-CM | POA: Diagnosis not present

## 2023-02-12 DIAGNOSIS — K8010 Calculus of gallbladder with chronic cholecystitis without obstruction: Secondary | ICD-10-CM | POA: Diagnosis not present

## 2023-02-12 DIAGNOSIS — K760 Fatty (change of) liver, not elsewhere classified: Secondary | ICD-10-CM | POA: Diagnosis not present

## 2023-02-12 DIAGNOSIS — K819 Cholecystitis, unspecified: Secondary | ICD-10-CM | POA: Diagnosis not present

## 2023-02-12 DIAGNOSIS — K838 Other specified diseases of biliary tract: Secondary | ICD-10-CM | POA: Diagnosis not present

## 2023-02-12 DIAGNOSIS — R001 Bradycardia, unspecified: Secondary | ICD-10-CM | POA: Diagnosis not present

## 2023-02-12 HISTORY — PX: INTRAOPERATIVE CHOLANGIOGRAM: SHX5230

## 2023-02-12 HISTORY — PX: CHOLECYSTECTOMY: SHX55

## 2023-02-12 LAB — URINALYSIS, ROUTINE W REFLEX MICROSCOPIC
Bilirubin Urine: NEGATIVE
Glucose, UA: NEGATIVE mg/dL
Hgb urine dipstick: NEGATIVE
Ketones, ur: NEGATIVE mg/dL
Leukocytes,Ua: NEGATIVE
Nitrite: NEGATIVE
Protein, ur: NEGATIVE mg/dL
Specific Gravity, Urine: 1.008 (ref 1.005–1.030)
pH: 7 (ref 5.0–8.0)

## 2023-02-12 LAB — COMPREHENSIVE METABOLIC PANEL
ALT: 19 U/L (ref 0–44)
AST: 21 U/L (ref 15–41)
Albumin: 4.1 g/dL (ref 3.5–5.0)
Alkaline Phosphatase: 65 U/L (ref 38–126)
Anion gap: 8 (ref 5–15)
BUN: 13 mg/dL (ref 6–20)
CO2: 25 mmol/L (ref 22–32)
Calcium: 8.8 mg/dL — ABNORMAL LOW (ref 8.9–10.3)
Chloride: 102 mmol/L (ref 98–111)
Creatinine, Ser: 1.1 mg/dL (ref 0.61–1.24)
GFR, Estimated: >60 mL/min (ref >60)
Glucose, Bld: 99 mg/dL (ref 70–99)
Potassium: 4.4 mmol/L (ref 3.5–5.1)
Sodium: 135 mmol/L (ref 135–145)
Total Bilirubin: 1.3 mg/dL — ABNORMAL HIGH (ref 0.3–1.2)
Total Protein: 6.9 g/dL (ref 6.5–8.1)

## 2023-02-12 LAB — LIPASE, BLOOD: Lipase: 54 U/L — ABNORMAL HIGH (ref 11–51)

## 2023-02-12 LAB — CBC
HCT: 46.5 % (ref 39.0–52.0)
Hemoglobin: 15.3 g/dL (ref 13.0–17.0)
MCH: 30.7 pg (ref 26.0–34.0)
MCHC: 32.9 g/dL (ref 30.0–36.0)
MCV: 93.4 fL (ref 80.0–100.0)
Platelets: 223 10*3/uL (ref 150–400)
RBC: 4.98 MIL/uL (ref 4.22–5.81)
RDW: 11.9 % (ref 11.5–15.5)
WBC: 8.2 10*3/uL (ref 4.0–10.5)
nRBC: 0 % (ref 0.0–0.2)

## 2023-02-12 SURGERY — LAPAROSCOPIC CHOLECYSTECTOMY WITH INTRAOPERATIVE CHOLANGIOGRAM
Anesthesia: General | Site: Abdomen

## 2023-02-12 MED ORDER — ACETAMINOPHEN 160 MG/5ML PO SOLN: 325.0000 mg | ORAL | Status: DC | PRN

## 2023-02-12 MED ORDER — 0.9 % SODIUM CHLORIDE (POUR BTL) OPTIME
TOPICAL | Status: DC | PRN
  Administered 2023-02-12: 1000 mL

## 2023-02-12 MED ORDER — PHENYLEPHRINE 80 MCG/ML (10ML) SYRINGE FOR IV PUSH (FOR BLOOD PRESSURE SUPPORT)
PREFILLED_SYRINGE | INTRAVENOUS | Status: DC | PRN
  Administered 2023-02-12 (×2): 160 ug via INTRAVENOUS

## 2023-02-12 MED ORDER — SODIUM CHLORIDE 0.9 % IR SOLN
Status: DC | PRN
  Administered 2023-02-12: 1000 mL

## 2023-02-12 MED ORDER — DEXAMETHASONE SODIUM PHOSPHATE 10 MG/ML IJ SOLN
INTRAMUSCULAR | Status: DC | PRN
  Administered 2023-02-12: 10 mg via INTRAVENOUS

## 2023-02-12 MED ORDER — ONDANSETRON HCL 4 MG/2ML IJ SOLN: 4.0000 mg | Freq: Four times a day (QID) | INTRAMUSCULAR | Status: DC | PRN

## 2023-02-12 MED ORDER — SUGAMMADEX SODIUM 200 MG/2ML IV SOLN
INTRAVENOUS | Status: DC | PRN
  Administered 2023-02-12: 200 mg via INTRAVENOUS

## 2023-02-12 MED ORDER — CEFAZOLIN SODIUM 1 G IJ SOLR
INTRAMUSCULAR | Status: AC
  Filled 2023-02-12: qty 30

## 2023-02-12 MED ORDER — FENTANYL CITRATE (PF) 250 MCG/5ML IJ SOLN
INTRAMUSCULAR | Status: DC | PRN
  Administered 2023-02-12 (×3): 50 ug via INTRAVENOUS

## 2023-02-12 MED ORDER — ROCURONIUM BROMIDE 10 MG/ML (PF) SYRINGE
PREFILLED_SYRINGE | INTRAVENOUS | Status: DC | PRN
  Administered 2023-02-12: 60 mg via INTRAVENOUS
  Administered 2023-02-12: 20 mg via INTRAVENOUS

## 2023-02-12 MED ORDER — PROPOFOL 10 MG/ML IV BOLUS
INTRAVENOUS | Status: DC | PRN
Start: 2023-02-12 — End: 2023-02-12
  Administered 2023-02-12: 200 mg via INTRAVENOUS

## 2023-02-12 MED ORDER — OXYCODONE HCL 5 MG PO TABS
ORAL_TABLET | ORAL | Status: AC
  Filled 2023-02-12: qty 1

## 2023-02-12 MED ORDER — OXYCODONE HCL 5 MG PO TABS
5.0000 mg | ORAL_TABLET | Freq: Four times a day (QID) | ORAL | 0 refills | Status: DC | PRN
Start: 2023-02-12 — End: 2023-02-26
  Filled 2023-02-12: qty 15, 4d supply, fill #0

## 2023-02-12 MED ORDER — PROPOFOL 10 MG/ML IV BOLUS
INTRAVENOUS | Status: AC
  Filled 2023-02-12: qty 20

## 2023-02-12 MED ORDER — AMISULPRIDE (ANTIEMETIC) 5 MG/2ML IV SOLN: 10.0000 mg | Freq: Once | INTRAVENOUS | Status: DC | PRN

## 2023-02-12 MED ORDER — OXYCODONE HCL 5 MG/5ML PO SOLN: 5.0000 mg | Freq: Once | ORAL | Status: AC | PRN

## 2023-02-12 MED ORDER — LIDOCAINE 2% (20 MG/ML) 5 ML SYRINGE
INTRAMUSCULAR | Status: DC | PRN
  Administered 2023-02-12: 40 mg via INTRAVENOUS

## 2023-02-12 MED ORDER — FENTANYL CITRATE (PF) 100 MCG/2ML IJ SOLN
25.0000 ug | INTRAMUSCULAR | Status: DC | PRN
  Administered 2023-02-12 (×2): 50 ug via INTRAVENOUS

## 2023-02-12 MED ORDER — MORPHINE SULFATE (PF) 2 MG/ML IV SOLN: 2.0000 mg | INTRAVENOUS | Status: DC | PRN

## 2023-02-12 MED ORDER — PHENYLEPHRINE HCL-NACL 20-0.9 MG/250ML-% IV SOLN
INTRAVENOUS | Status: DC | PRN
  Administered 2023-02-12: 25 ug/min via INTRAVENOUS

## 2023-02-12 MED ORDER — ACETAMINOPHEN 500 MG PO TABS: 1000.0000 mg | ORAL_TABLET | Freq: Three times a day (TID) | ORAL | Status: DC | PRN

## 2023-02-12 MED ORDER — ACETAMINOPHEN 325 MG PO TABS: 325.0000 mg | ORAL_TABLET | ORAL | Status: DC | PRN

## 2023-02-12 MED ORDER — PROMETHAZINE HCL 25 MG/ML IJ SOLN: 6.2500 mg | INTRAMUSCULAR | Status: DC | PRN

## 2023-02-12 MED ORDER — FENTANYL CITRATE (PF) 100 MCG/2ML IJ SOLN
INTRAMUSCULAR | Status: AC
  Filled 2023-02-12: qty 2

## 2023-02-12 MED ORDER — ACETAMINOPHEN 500 MG PO TABS
1000.0000 mg | ORAL_TABLET | Freq: Once | ORAL | Status: AC
  Administered 2023-02-12: 1000 mg via ORAL
  Filled 2023-02-12: qty 2

## 2023-02-12 MED ORDER — CHLORHEXIDINE GLUCONATE 0.12 % MT SOLN
OROMUCOSAL | Status: AC
  Filled 2023-02-12: qty 15

## 2023-02-12 MED ORDER — LACTATED RINGERS IV SOLN: INTRAVENOUS | Status: DC | PRN

## 2023-02-12 MED ORDER — SCOPOLAMINE 1 MG/3DAYS TD PT72
MEDICATED_PATCH | TRANSDERMAL | Status: AC
  Filled 2023-02-12: qty 1

## 2023-02-12 MED ORDER — CEFAZOLIN SODIUM-DEXTROSE 2-3 GM-%(50ML) IV SOLR
INTRAVENOUS | Status: DC | PRN
Start: 2023-02-12 — End: 2023-02-12
  Administered 2023-02-12: 2 g via INTRAVENOUS

## 2023-02-12 MED ORDER — ONDANSETRON HCL 4 MG/2ML IJ SOLN
INTRAMUSCULAR | Status: DC | PRN
  Administered 2023-02-12: 4 mg via INTRAVENOUS

## 2023-02-12 MED ORDER — BUPIVACAINE-EPINEPHRINE 0.25% -1:200000 IJ SOLN
INTRAMUSCULAR | Status: DC | PRN
  Administered 2023-02-12: 20 mL

## 2023-02-12 MED ORDER — MIDAZOLAM HCL 2 MG/2ML IJ SOLN
INTRAMUSCULAR | Status: AC
  Filled 2023-02-12: qty 2

## 2023-02-12 MED ORDER — KETOROLAC TROMETHAMINE 30 MG/ML IJ SOLN
INTRAMUSCULAR | Status: DC | PRN
  Administered 2023-02-12: 30 mg via INTRAVENOUS

## 2023-02-12 MED ORDER — BUPIVACAINE-EPINEPHRINE (PF) 0.25% -1:200000 IJ SOLN
INTRAMUSCULAR | Status: AC
  Filled 2023-02-12: qty 30

## 2023-02-12 MED ORDER — MORPHINE SULFATE (PF) 4 MG/ML IV SOLN
4.0000 mg | Freq: Once | INTRAVENOUS | Status: AC
  Administered 2023-02-12: 4 mg via INTRAVENOUS
  Filled 2023-02-12: qty 1

## 2023-02-12 MED ORDER — ACETAMINOPHEN 10 MG/ML IV SOLN: 1000.0000 mg | Freq: Once | INTRAVENOUS | Status: DC | PRN

## 2023-02-12 MED ORDER — MIDAZOLAM HCL 2 MG/2ML IJ SOLN
INTRAMUSCULAR | Status: DC | PRN
  Administered 2023-02-12: 2 mg via INTRAVENOUS

## 2023-02-12 MED ORDER — FENTANYL CITRATE (PF) 250 MCG/5ML IJ SOLN
INTRAMUSCULAR | Status: AC
  Filled 2023-02-12: qty 5

## 2023-02-12 MED ORDER — OXYCODONE HCL 5 MG PO TABS
5.0000 mg | ORAL_TABLET | Freq: Once | ORAL | Status: AC | PRN
  Administered 2023-02-12: 5 mg via ORAL

## 2023-02-12 SURGICAL SUPPLY — 47 items
ADH SKN CLS APL DERMABOND .7 (GAUZE/BANDAGES/DRESSINGS) ×1
APL PRP STRL LF DISP 70% ISPRP (MISCELLANEOUS) ×1
APPLIER CLIP 5 13 M/L LIGAMAX5 (MISCELLANEOUS) ×1
APR CLP MED LRG 5 ANG JAW (MISCELLANEOUS) ×1
BAG COUNTER SPONGE SURGICOUNT (BAG) ×2 IMPLANT
BAG SPNG CNTER NS LX DISP (BAG) ×1
CANISTER SUCT 3000ML PPV (MISCELLANEOUS) ×2 IMPLANT
CHLORAPREP W/TINT 26 (MISCELLANEOUS) ×2 IMPLANT
CLIP APPLIE 5 13 M/L LIGAMAX5 (MISCELLANEOUS) ×2 IMPLANT
CNTNR URN SCR LID CUP LEK RST (MISCELLANEOUS) ×2 IMPLANT
CONT SPEC 4OZ STRL OR WHT (MISCELLANEOUS) ×1
COVER MAYO STAND STRL (DRAPES) IMPLANT
COVER SURGICAL LIGHT HANDLE (MISCELLANEOUS) ×2 IMPLANT
DERMABOND ADVANCED .7 DNX12 (GAUZE/BANDAGES/DRESSINGS) ×2 IMPLANT
DRAPE C-ARM 42X120 X-RAY (DRAPES) IMPLANT
ELECT REM PT RETURN 9FT ADLT (ELECTROSURGICAL) ×1
ELECTRODE REM PT RTRN 9FT ADLT (ELECTROSURGICAL) ×2 IMPLANT
GAUZE 4X4 16PLY ~~LOC~~+RFID DBL (SPONGE) ×2 IMPLANT
GLOVE BIO SURGEON STRL SZ 6 (GLOVE) ×2 IMPLANT
GLOVE INDICATOR 6.5 STRL GRN (GLOVE) ×2 IMPLANT
GOWN STRL REUS W/ TWL LRG LVL3 (GOWN DISPOSABLE) ×2 IMPLANT
GOWN STRL REUS W/TWL LRG LVL3 (GOWN DISPOSABLE) ×1
GRASPER SUT TROCAR 14GX15 (MISCELLANEOUS) ×2 IMPLANT
IRRIG SUCT STRYKERFLOW 2 WTIP (MISCELLANEOUS) ×1
IRRIGATION SUCT STRKRFLW 2 WTP (MISCELLANEOUS) ×2 IMPLANT
KIT BASIN OR (CUSTOM PROCEDURE TRAY) ×2 IMPLANT
KIT IMAGING PINPOINTPAQ (MISCELLANEOUS) IMPLANT
KIT TURNOVER KIT B (KITS) ×2 IMPLANT
NDL INSUFFLATION 14GA 120MM (NEEDLE) ×2 IMPLANT
NEEDLE INSUFFLATION 14GA 120MM (NEEDLE) ×1 IMPLANT
NS IRRIG 1000ML POUR BTL (IV SOLUTION) ×2 IMPLANT
PAD ARMBOARD 7.5X6 YLW CONV (MISCELLANEOUS) ×2 IMPLANT
SCISSORS LAP 5X35 DISP (ENDOMECHANICALS) ×2 IMPLANT
SET CHOLANGIOGRAPH 5 50 .035 (SET/KITS/TRAYS/PACK) IMPLANT
SET TUBE SMOKE EVAC HIGH FLOW (TUBING) ×2 IMPLANT
SLEEVE Z-THREAD 5X100MM (TROCAR) ×4 IMPLANT
SPONGE T-LAP 18X18 ~~LOC~~+RFID (SPONGE) ×2 IMPLANT
SUT MNCRL AB 4-0 PS2 18 (SUTURE) ×2 IMPLANT
SYS BAG RETRIEVAL 10MM (BASKET) ×1
SYSTEM BAG RETRIEVAL 10MM (BASKET) ×2 IMPLANT
TOWEL GREEN STERILE (TOWEL DISPOSABLE) ×2 IMPLANT
TOWEL GREEN STERILE FF (TOWEL DISPOSABLE) ×2 IMPLANT
TRAY LAPAROSCOPIC MC (CUSTOM PROCEDURE TRAY) ×2 IMPLANT
TROCAR 11X100 Z THREAD (TROCAR) ×2 IMPLANT
TROCAR Z-THREAD OPTICAL 5X100M (TROCAR) ×2 IMPLANT
WARMER LAPAROSCOPE (MISCELLANEOUS) ×2 IMPLANT
WATER STERILE IRR 1000ML POUR (IV SOLUTION) ×2 IMPLANT

## 2023-02-12 NOTE — Anesthesia Postprocedure Evaluation (Signed)
Anesthesia Post Note  Patient: Tracy Rich  Procedure(s) Performed: LAPAROSCOPIC CHOLECYSTECTOMY (Abdomen) INTRAOPERATIVE CHOLANGIOGRAM (Abdomen)     Patient location during evaluation: PACU Anesthesia Type: General Level of consciousness: awake and alert Pain management: pain level controlled Vital Signs Assessment: post-procedure vital signs reviewed and stable Respiratory status: spontaneous breathing, nonlabored ventilation, respiratory function stable and patient connected to nasal cannula oxygen Cardiovascular status: blood pressure returned to baseline and stable Postop Assessment: no apparent nausea or vomiting Anesthetic complications: no  No notable events documented.  Last Vitals:  Vitals:   02/12/23 1500 02/12/23 1515  BP: 111/79 104/72  Pulse: 63 64  Resp: 16 15  Temp:    SpO2: 97% 95%    Last Pain:  Vitals:   02/12/23 1445  TempSrc:   PainSc: 4                  Shelton Silvas

## 2023-02-12 NOTE — Anesthesia Preprocedure Evaluation (Addendum)
Anesthesia Evaluation  Patient identified by MRN, date of birth, ID band Patient awake    Reviewed: Allergy & Precautions, NPO status , Patient's Chart, lab work & pertinent test results  Airway Mallampati: I  TM Distance: >3 FB Neck ROM: Full    Dental  (+) Teeth Intact, Dental Advisory Given   Pulmonary Current Smoker and Patient abstained from smoking., former smoker   breath sounds clear to auscultation       Cardiovascular negative cardio ROS  Rhythm:Regular Rate:Normal     Neuro/Psych   Anxiety     negative neurological ROS     GI/Hepatic Neg liver ROS,GERD  Medicated,,  Endo/Other  negative endocrine ROS    Renal/GU negative Renal ROS     Musculoskeletal   Abdominal   Peds  Hematology negative hematology ROS (+)   Anesthesia Other Findings   Reproductive/Obstetrics                             Anesthesia Physical Anesthesia Plan  ASA: 2  Anesthesia Plan: General   Post-op Pain Management: Tylenol PO (pre-op)* and Toradol IV (intra-op)*   Induction: Intravenous  PONV Risk Score and Plan: 3 and Dexamethasone, Midazolam and Ondansetron  Airway Management Planned: Oral ETT  Additional Equipment: None  Intra-op Plan:   Post-operative Plan: Extubation in OR  Informed Consent: I have reviewed the patients History and Physical, chart, labs and discussed the procedure including the risks, benefits and alternatives for the proposed anesthesia with the patient or authorized representative who has indicated his/her understanding and acceptance.     Dental advisory given  Plan Discussed with: CRNA  Anesthesia Plan Comments:        Anesthesia Quick Evaluation

## 2023-02-12 NOTE — ED Provider Notes (Signed)
[place] EM[facility] DE[facility] AT [facility] Provider Note   CSN: [id number] Arrival date & time: 02/12/23  0554     History  Chief Complaint  Patient presents with   Abdominal Pain    Tracy Rich is a 27 y.o. male.  Patient is a 27 year old male with possible history of GERD, presented today for epigastric pain.  He notes it has been intermittent for the last 3 days.  He admits to associated nausea without vomiting.  He denies any association of his pain with food.  He has taken his home famotidine as well as Zofran without any symptom improvement.  He thought her symptoms may have been due to constipation, but took magnesium citrate yesterday with improvement in his constipation but worsening of his abdominal pain.  He denies any fevers or chills.  He denies any chest pain or shortness of breath.  He has no dysuria or hematuria.  He denies any prior abdominal surgeries.  He denies any alcohol use.     Home Medications Prior to Admission medications   Medication Sig Start Date End Date Taking? Authorizing Provider  amphetamine-dextroamphetamine (ADDERALL) 10 MG tablet Take 1 tablet (10 mg total) by mouth 2 (two) times daily with a meal. 01/01/23   Worthy Rancher B, FNP  clonazePAM (KLONOPIN) 1 MG tablet Take 1 tablet (1 mg total) by mouth 2 (two) times daily as needed for anxiety. 12/21/22   Saguier, Ramon Dredge, PA-C  famotidine (PEPCID) 20 MG tablet Take 1 tablet (20 mg total) by mouth 2 (two) times daily. 10/19/22   Saguier, Ramon Dredge, PA-C  methylPREDNISolone (MEDROL DOSEPAK) 4 MG TBPK tablet Take as directed on pack for 6 days. Patient not taking: Reported on 11/06/2022 07/11/22   Saguier, Ramon Dredge, PA-C  Vitamin D, Ergocalciferol, (DRISDOL) 1.25 MG (50000 UNIT) CAPS capsule Take 1 capsule (50,000 Units total) by mouth every 7 (seven) days. 12/21/22   Saguier, Ramon Dredge, PA-C  metoprolol succinate (TOPROL-XL) 25 MG 24 hr tablet Take 1 tablet (25 mg total) by mouth daily. 09/15/20 09/23/20   Saguier, Ramon Dredge, PA-C  sertraline (ZOLOFT) 25 MG tablet Take 1 tablet (25 mg total) by mouth daily. Patient not taking: No sig reported 06/15/20 09/23/20  Saguier, Ramon Dredge, PA-C      Allergies    Patient has no known allergies.    Review of Systems   Negative except as noted above in HPI  Physical Exam Updated Vital Signs BP (!) 148/98 (BP Location: Right Arm)   Pulse 82   Temp 98.1 F (36.7 C)   Resp 20   Ht 6\' 1"  (1.854 m)   Wt 117.9 kg   SpO2 100%   BMI 34.30 kg/m  Physical Exam Vitals and nursing note reviewed.  Constitutional:      General: He is not in acute distress.    Appearance: He is well-developed.  HENT:     Head: Normocephalic and atraumatic.     Mouth/Throat:     Pharynx: No oropharyngeal exudate.  Eyes:     Conjunctiva/sclera: Conjunctivae normal.  Cardiovascular:     Rate and Rhythm: Normal rate and regular rhythm.     Heart sounds: No murmur heard. Pulmonary:     Effort: Pulmonary effort is normal. No respiratory distress.     Breath sounds: Normal breath sounds.     Comments: Saturating well on room air Abdominal:     General: There is no distension.     Palpations: Abdomen is soft.     Tenderness:  There is abdominal tenderness (Epigastric and right upper quadrant tenderness palpation). There is no right CVA tenderness, left CVA tenderness, guarding or rebound.  Musculoskeletal:        General: No swelling or tenderness.     Cervical back: Neck supple.  Skin:    General: Skin is warm and dry.     Capillary Refill: Capillary refill takes less than 2 seconds.     Findings: No rash.  Neurological:     Mental Status: He is alert and oriented to person, place, and time.  Psychiatric:        Mood and Affect: Mood normal.     ED Results / Procedures / Treatments   Labs (all labs ordered are listed, but only abnormal results are displayed) Labs Reviewed  LIPASE, BLOOD - Abnormal; Notable for the following components:      Result Value   Lipase  54 (*)    All other components within normal limits  COMPREHENSIVE METABOLIC PANEL - Abnormal; Notable for the following components:   Calcium 8.8 (*)    Total Bilirubin 1.3 (*)    All other components within normal limits  URINALYSIS, ROUTINE W REFLEX MICROSCOPIC - Abnormal; Notable for the following components:   Color, Urine STRAW (*)    All other components within normal limits  CBC    EKG EKG Interpretation Date/Time:  Monday February 12 2023 08:55:35 EDT Ventricular Rate:  56 PR Interval:  184 QRS Duration:  92 QT Interval:  408 QTC Calculation: 393 R Axis:   71  Text Interpretation: Sinus bradycardia with sinus arrhythmia Otherwise normal ECG No previous ECGs available Confirmed by Eber Hong ([id number]) on 02/12/2023 9:09:25 AM  Radiology US Abdomen Limited RUQ (LIVER/GB)  Result Date: 02/12/2023 CLINICAL DATA:  Right upper quadrant pain EXAM: ULTRASOUND ABDOMEN LIMITED RIGHT UPPER QUADRANT COMPARISON:  CT AP from 01/25/2020 FINDINGS: Gallbladder: Gallbladder wall thickness measures 3 mm. Sludge identified within the gallbladder. No stones or pericholecystic fluid. Positive sonographic Murphy's sign reported. Common bile duct: Diameter: 3.5 mm.  No intrahepatic bile duct dilatation. Liver: Increased parenchymal echogenicity suggest hepatic steatosis. No focal liver abnormality. Portal vein is patent on color Doppler imaging with normal direction of blood flow towards the liver. Other: None. IMPRESSION: 1. Gallbladder sludge along with upper limits of normal gallbladder wall thickness. A positive sonographic Murphy's sign was reported by the sonographer. Imaging findings are nonspecific. If there is a high clinical concern for acute cholecystitis consider further investigation with nuclear medicine hepatobiliary scan. 2. Hepatic steatosis. Electronically Signed   By: Signa Kell M.D.   On: 02/12/2023 08:37      Medications Ordered in ED Medications  morphine (PF) 2 MG/ML  injection 2 mg (has no administration in time range)  ondansetron (ZOFRAN) injection 4 mg (has no administration in time range)  morphine (PF) 4 MG/ML injection 4 mg (4 mg Intravenous Given 02/12/23 1008)    ED Course/ Medical Decision Making/ A&P Clinical Course as of 02/12/23 1136  Mon Feb 12, 2023  0900 EKG reveals sinus bradycardia 56 bpm without any acute ST or T wave abnormalities [CD]  1028 Last ate at 0130 this AM [CD]    Clinical Course User Index [CD] Rhys Martini, DO                                Medical Decision Making Problems Addressed: Right upper quadrant abdominal pain: complicated acute illness  or injury  Amount and/or Complexity of Data Reviewed Labs: ordered. Radiology: ordered and independent interpretation performed.  Risk Prescription drug management. Decision regarding hospitalization.    Patient is a 27 year old male with possible history of GERD, presented today for epigastric pain. On exam, patient is alert and oriented. He is in RRR. Lungs are CTA. He does  have RUQ and epigastric tenderness to palpation.  Presentation concerning for gastroenteritis, cholelithiasis, cholecystitis, PUD, GERD, and pancreatitis. Lower suspicion for appendicitis, uti, pyelonephritis, and bowel obstruction based on area of pain and he is having bowel movements w/o difficulty.  Patient given morphine for symptomatic control.   Labwork reveals lipase of 54 and t.bili of 1.3, but are otherwise unremarkable.   Ultrasound reveals gallbladder sludge with gallbladder wall thickness of 3 mm, positive sonographic Murphy sign, and hepatic steatosis.  EKG with findings as noted above.  Given ultrasound findings and slight abnormalities of lab results, concern for symptomatic cholelithiasis.  Discussed case with surgery who evaluated the patient at the bedside.  Plan to admit for acute cholecystitis.  He is hemodynamically stable and appropriate for transfer to the  floor.   Final Clinical Impression(s) / ED Diagnoses Final diagnoses:  Right upper quadrant abdominal pain  Acute cholecystitis    Rx / DC Orders ED Discharge Orders     None         Rhys Martini, DO 02/12/23 1136    Eber Hong, MD 02/13/23 1212

## 2023-02-12 NOTE — Op Note (Signed)
Operative Note  Tracy Rich 27 y.o. male [id number]  02/12/2023  Surgeon: Berna Bue MD FACS  Procedure performed: Laparoscopic Cholecystectomy  Procedure classification: urgent  Preop diagnosis: cholecystitis Post-op diagnosis/intraop findings: same  Specimens: gallbladder  Retained items: none  EBL: minimal  Complications: none  Description of procedure: After obtaining informed consent the patient was brought to the operating room. Antibiotics were administered. SCD's were applied. General endotracheal anesthesia was initiated and a formal time-out was performed. The abdomen was prepped and draped in the usual sterile fashion and the abdomen was entered using  a left subcostal veress needle  after instilling the site with local. Insufflation to was obtained, 5mm trocar and camera inserted using optical entry in the right upper quadrant, and gross inspection revealed no evidence of injury from our entry or other intraabdominal abnormalities. Two 5mm trocars were introduced in the infraumbilical and right anterior axillary lines under direct visualization and following infiltration with local. An 11mm trocar was placed in the epigastrium. The gallbladder was retracted cephalad and the infundibulum was retracted laterally. A combination of hook electrocautery and blunt dissection was utilized to clear the peritoneum from the neck and cystic duct, circumferentially isolating the cystic artery and cystic duct and lifting the gallbladder from the cystic plate. The critical view of safety was achieved with the cystic artery, cystic duct, and liver bed visualized between them with no other structures. The cystic duct was very diminutive. The artery was clipped with a single clip proximally and distally and divided as was the cystic duct with three clips on the proximal end. The gallbladder was dissected from the liver plate using electrocautery. Once freed the gallbladder was placed  in an endocatch bag and removed through the epigastric trocar site. Some very dark viscous bile had been spilled from the gallbladder during its dissection from the liver bed along the gallbladder fundus. This was aspirated and the right upper quadrant was irrigated copiously;  the effluent was clear. Hemostasis was once again confirmed, and reinspection of the abdomen revealed no injuries. The clips were well opposed without any bile leak from the duct or the liver bed. The 11mm trocar site in the epigastrium was closed with a 0 vicryl in the fascia under direct visualization using a PMI device. The abdomen was desufflated and all trocars removed. The skin incisions were closed with subcuticular 4-0 monocryl and Dermabond. The patient was awakened, extubated and transported to the recovery room in stable condition.    All counts were correct at the completion of the case.

## 2023-02-12 NOTE — H&P (Signed)
Tracy Rich [birth year]  [id number].    Requesting MD: Dr. Rhys Martini Chief Complaint/Reason for Consult: Acute Cholecystitis   HPI: Tracy Rich is a 27 y.o. male with a history of anxiety who presented to the ED for abdominal pain.  Patient reports that 3 days ago he began having intermittent episodes of epigastric and RUQ abdominal pain with radiation to the back with associated nausea and vomiting.  He reports his symptoms usually last for about 4 hours before self resolving however this morning he awoke around 4 AM with pain that has not subsided and prompted his visit to the ED.  He is unsure if his symptoms are worsened with p.o. intake. He did eat Taquitos shortly before falling asleep and then awoke with his pain.   No history of similar symptoms prior to 3 days ago.  This does not feel similar to his GERD.  No alcohol use.  No frequent NSAID use.  No history of issues with his gallbladder or pancreatitis in the past.  No fevers at home. No lower abdominal pain.   In the ED he was afebrile without tachycardia or hypotension.  WBC 8.2.  Lipase 54.  T. bili 1.3.  LFTs otherwise within normal limits.  RUQ ultrasound with sludge and + murphy's sign. CBD 3.38mm.   Prior Abdominal Surgeries: None Blood Thinners: None Last PO intake: Taquitos at midnight. Water at 4-430am.  Allergies: NKDA Tobacco Use: 1/2 PPD Alcohol Use: None Substance use: None  Employment: [employer] Lives at home with his parents. He is accompanied by his friend who is at bedside.    ROS: ROS As above, see hpi  Family History  Problem Relation Age of Onset   Thyroid disease Mother    Cancer Mother    Hypertension Mother    Thyroid cancer Mother    GER disease Mother    Healthy Father    GER disease Father    Healthy Sister    Esophageal cancer Neg Hx    Colon cancer Neg Hx     Past Medical History:  Diagnosis Date   Anxiety    Hydradenitis 01/03/2021   Patient states history of  hidradenitis suppurativa    Past Surgical History:  Procedure Laterality Date   TONSILLECTOMY     WISDOM TOOTH EXTRACTION      Social History:  reports that he has quit smoking. He has never used smokeless tobacco. He reports that he does not drink alcohol and does not use drugs.  Allergies: No Known Allergies  (Not in a hospital admission)    Physical Exam: Blood pressure (!) 135/100, pulse 74, temperature 98.2 F (36.8 C), resp. rate 18, height 6\' 1"  (1.854 m), weight 117.9 kg, SpO2 98%. General: pleasant, WD/WN male who is laying in bed in NAD HEENT: head is normocephalic, atraumatic.   Heart: regular, rate, and rhythm.  Palpable pedal pulses bilaterally  Lungs: CTAB, no wheezes, rhonchi, or rales noted.  Respiratory effort nonlabored Abd:  Soft, ND, epigastric and RUQ ttp. Equivocal Murphy's sign. +BS. No masses, hernias, or organomegaly MS: MAE's. No BUE edema Skin: warm and dry  Psych: A&Ox4 with an appropriate affect Neuro: cranial nerves grossly intact, normal speech, thought process intact, moves all extremities, gait not assessed   Results for orders placed or performed during the hospital encounter of 02/12/23 (from the past 48 hour(s))  Lipase, blood     Status: Abnormal   Collection Time: 02/12/23  6:36 AM  Result  Value Ref Range   Lipase 54 (H) 11 - 51 U/L    Comment: Performed at [facility], [address] [place], [place] [address]  Comprehensive metabolic panel     Status: Abnormal   Collection Time: 02/12/23  6:36 AM  Result Value Ref Range   Sodium 135 135 - 145 mmol/L   Potassium 4.4 3.5 - 5.1 mmol/L   Chloride 102 98 - 111 mmol/L   CO2 25 22 - 32 mmol/L   Glucose, Bld 99 70 - 99 mg/dL    Comment: Glucose reference range applies only to samples taken after fasting for at least 8 hours.   BUN 13 6 - 20 mg/dL   Creatinine, Ser 2.95 0.61 - 1.24 mg/dL   Calcium 8.8 (L) 8.9 - 10.3 mg/dL   Total Protein 6.9 6.5 - 8.1 g/dL   Albumin 4.1 3.5 -  5.0 g/dL   AST 21 15 - 41 U/L   ALT 19 0 - 44 U/L   Alkaline Phosphatase 65 38 - 126 U/L   Total Bilirubin 1.3 (H) 0.3 - 1.2 mg/dL   GFR, Estimated >18 >84 mL/min    Comment: (NOTE) Calculated using the CKD-EPI Creatinine Equation (2021)    Anion gap 8 5 - 15    Comment: Performed at [facility], [address] [place], [place] [address]  CBC     Status: None   Collection Time: 02/12/23  6:36 AM  Result Value Ref Range   WBC 8.2 4.0 - 10.5 K/uL   RBC 4.98 4.22 - 5.81 MIL/uL   Hemoglobin 15.3 13.0 - 17.0 g/dL   HCT 30.1 60.1 - 09.3 %   MCV 93.4 80.0 - 100.0 fL   MCH 30.7 26.0 - 34.0 pg   MCHC 32.9 30.0 - 36.0 g/dL   RDW 23.5 57.3 - 22.0 %   Platelets 223 150 - 400 K/uL   nRBC 0.0 0.0 - 0.2 %    Comment: Performed at [facility], [address] [place], [place] [address]  Urinalysis, Routine w reflex microscopic -Urine, Clean Catch     Status: Abnormal   Collection Time: 02/12/23  6:36 AM  Result Value Ref Range   Color, Urine STRAW (A) YELLOW   APPearance CLEAR CLEAR   Specific Gravity, Urine 1.008 1.005 - 1.030   pH 7.0 5.0 - 8.0   Glucose, UA NEGATIVE NEGATIVE mg/dL   Hgb urine dipstick NEGATIVE NEGATIVE   Bilirubin Urine NEGATIVE NEGATIVE   Ketones, ur NEGATIVE NEGATIVE mg/dL   Protein, ur NEGATIVE NEGATIVE mg/dL   Nitrite NEGATIVE NEGATIVE   Leukocytes,Ua NEGATIVE NEGATIVE    Comment: Performed at [facility], [address], [place], [place] [address]   US Abdomen Limited RUQ (LIVER/GB)  Result Date: 02/12/2023 CLINICAL DATA:  Right upper quadrant pain EXAM: ULTRASOUND ABDOMEN LIMITED RIGHT UPPER QUADRANT COMPARISON:  CT AP from 01/25/2020 FINDINGS: Gallbladder: Gallbladder wall thickness measures 3 mm. Sludge identified within the gallbladder. No stones or pericholecystic fluid. Positive sonographic Murphy's sign reported. Common bile duct: Diameter: 3.5 mm.  No intrahepatic bile duct dilatation. Liver: Increased parenchymal echogenicity suggest  hepatic steatosis. No focal liver abnormality. Portal vein is patent on color Doppler imaging with normal direction of blood flow towards the liver. Other: None. IMPRESSION: 1. Gallbladder sludge along with upper limits of normal gallbladder wall thickness. A positive sonographic Murphy's sign was reported by the sonographer. Imaging findings are nonspecific. If there is a high  clinical concern for acute cholecystitis consider further investigation with nuclear medicine hepatobiliary scan. 2. Hepatic steatosis. Electronically Signed   By: Signa Kell M.D.   On: 02/12/2023 08:37    Anti-infectives (From admission, onward)    None       Assessment/Plan This is a 27 y.o. male who presented with 3-day history of intermittent epigastric and RUQ abdominal pain with radiation to his back with associated nausea and vomiting.  Most recent episode has persisted and he is with epigastric/RUQ ttp on exam with equivocal Murphy's sign on exam. In the ED he was afebrile without tachycardia or hypotension.  WBC 8.2.  Lipase 54.  T. bili 1.3.  LFTs otherwise within normal limits.  RUQ ultrasound with sludge and + murphy's sign. Gallbladder wall 3mm. No pericholecystic fluid. CBD 3.33mm.  I do suspect that his symptoms are coming from his gallbladder. He may have developing/early acute cholecystitis and I discussed with him the recommendation for laparoscopic cholecystectomy.  I have explained the procedure, risks, and aftercare of Laparoscopic cholecystectomy.  Risks include but are not limited to anesthesia (MI, CVA, death, prolonged intubation and aspiration), bleeding, infection, wound problems, hernia, bile leak, injury to common bile duct/liver/intestine, possible need for subtotal cholecystectomy or open cholecystectomy, increased risk of DVT/PE and diarrhea post op. He seems to understand and agrees to proceed. Keep NPO. Start IV abx. Possible d/c from PACU. If requires admission postoperatively, will plan  admission to observation.   I reviewed nursing notes, last 24 h vitals and pain scores, last 48 h intake and output, last 24 h labs and trends, and last 24 h imaging results.  Jacinto Halim, [facility] 02/12/2023, 9:53 AM Please see Amion for pager number during day hours 7:00am-4:30pm

## 2023-02-12 NOTE — Discharge Instructions (Signed)

## 2023-02-12 NOTE — ED Notes (Signed)
Pt to Xray.

## 2023-02-12 NOTE — Transfer of Care (Signed)
Immediate Anesthesia Transfer of Care Note  Patient: Tracy Rich  Procedure(s) Performed: LAPAROSCOPIC CHOLECYSTECTOMY (Abdomen) INTRAOPERATIVE CHOLANGIOGRAM (Abdomen)  Patient Location: PACU  Anesthesia Type:General  Level of Consciousness: awake, alert , and oriented  Airway & Oxygen Therapy: Patient Spontanous Breathing  Post-op Assessment: Report given to RN and Post -op Vital signs reviewed and stable  Post vital signs: Reviewed and stable  Last Vitals:  Vitals Value Taken Time  BP 152/92 02/12/23 1346  Temp    Pulse 91 02/12/23 1348  Resp 22 02/12/23 1348  SpO2 93 % 02/12/23 1348  Vitals shown include unfiled device data.  Last Pain:  Vitals:   02/12/23 1204  TempSrc: Oral  PainSc:          Complications: No notable events documented.

## 2023-02-12 NOTE — Anesthesia Procedure Notes (Signed)
Procedure Name: Intubation Date/Time: 02/12/2023 12:49 PM  Performed by: Garfield Cornea, CRNAPre-anesthesia Checklist: Patient identified, Emergency Drugs available, Suction available and Patient being monitored Patient Re-evaluated:Patient Re-evaluated prior to induction Oxygen Delivery Method: Circle System Utilized Preoxygenation: Pre-oxygenation with 100% oxygen Induction Type: IV induction Ventilation: Mask ventilation without difficulty and Oral airway inserted - appropriate to patient size Laryngoscope Size: Mac and 4 Grade View: Grade I Tube type: Oral Tube size: 7.5 mm Number of attempts: 1 Airway Equipment and Method: Stylet and Oral airway Placement Confirmation: ETT inserted through vocal cords under direct vision, positive ETCO2 and breath sounds checked- equal and bilateral Secured at: 23 cm Tube secured with: Tape Dental Injury: Teeth and Oropharynx as per pre-operative assessment

## 2023-02-12 NOTE — ED Triage Notes (Signed)
Patient C/O intermittent right & left upper quadrant abdominal pain x 3 days.  Reports nausea and vomiting 2x  Last BM 02/11/23, took mag citrate for concern of constipation.    Denies fever, body aches.

## 2023-02-13 ENCOUNTER — Encounter (HOSPITAL_COMMUNITY): Payer: Self-pay | Admitting: Surgery

## 2023-02-13 ENCOUNTER — Encounter: Payer: 59 | Admitting: Medical

## 2023-02-13 ENCOUNTER — Telehealth: Payer: Self-pay

## 2023-02-13 NOTE — Transitions of Care (Post Inpatient/ED Visit) (Signed)
   02/13/2023  Name: Tracy Rich MRN: [id number] DOB: Feb 16, 1996  Today's TOC FU Call Status: Today's TOC FU Call Status:: Unsuccessful Call (1st Attempt) Unsuccessful Call (1st Attempt) Date: 02/13/23  Attempted to reach the patient regarding the most recent Inpatient/ED visit.  Follow Up Plan: Additional outreach attempts will be made to reach the patient to complete the Transitions of Care (Post Inpatient/ED visit) call.   Signature Karena Addison, LPN [facility] Nurse Health Advisor Direct Dial [phone]

## 2023-02-15 ENCOUNTER — Ambulatory Visit: Payer: 59 | Admitting: Family

## 2023-02-15 ENCOUNTER — Encounter: Payer: Self-pay | Admitting: Family

## 2023-02-15 VITALS — BP 120/66 | HR 55 | Resp 18 | Ht 73.0 in | Wt 262.4 lb

## 2023-02-16 NOTE — Progress Notes (Signed)
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

## 2023-02-18 ENCOUNTER — Encounter: Payer: Self-pay | Admitting: Medical

## 2023-02-19 ENCOUNTER — Other Ambulatory Visit: Payer: Self-pay | Admitting: Family

## 2023-02-19 MED ORDER — AMPHETAMINE-DEXTROAMPHETAMINE 10 MG PO TABS
10.0000 mg | ORAL_TABLET | Freq: Two times a day (BID) | ORAL | 0 refills | Status: DC
  Filled 2023-02-19: qty 60, 30d supply, fill #0

## 2023-02-19 MED ORDER — CLONAZEPAM 1 MG PO TABS
1.0000 mg | ORAL_TABLET | Freq: Two times a day (BID) | ORAL | 1 refills | Status: DC | PRN
  Filled 2023-02-19: qty 60, 30d supply, fill #0
  Filled 2023-03-22: qty 60, 30d supply, fill #1

## 2023-02-19 NOTE — Transitions of Care (Post Inpatient/ED Visit) (Signed)
   02/19/2023  Name: Tracy Rich MRN: [id number] DOB: 1996/02/09  Today's TOC FU Call Status: Today's TOC FU Call Status:: Successful TOC FU Call Completed Unsuccessful Call (1st Attempt) Date: 02/13/23 [facility] FU Call Complete Date: 02/19/23  Transition Care Management Follow-up Telephone Call Date of Discharge: 02/12/23 Discharge Facility: [facility]) Type of Discharge: Inpatient Admission Primary Inpatient Discharge Diagnosis:: RUQ pain How have you been since you were released from the hospital?: Better Any questions or concerns?: No  Items Reviewed: Medications obtained,verified, and reconciled?: Yes (Medications Reviewed) Any new allergies since your discharge?: No Dietary orders reviewed?: Yes Do you have support at home?: Yes People in Home: parent(s)  Medications Reviewed Today: Medications Reviewed Today     Reviewed by Karena Addison, LPN (Licensed Practical Nurse) on 02/19/23 at 1643  Med List Status: <None>   Medication Order Taking? Sig Documenting Provider Last Dose Status Informant  acetaminophen (TYLENOL) 500 MG tablet [id number]  Take 2 tablets (1,000 mg total) by mouth every 8 (eight) hours as needed. Maczis, Elmer Sow, PA-C  Active   amphetamine-dextroamphetamine (ADDERALL) 10 MG tablet [id number]  Take 1 tablet (10 mg total) by mouth 2 (two) times daily with a meal. Worthy Rancher B, FNP  Active   clonazePAM (KLONOPIN) 1 MG tablet [id number]  Take 1 tablet (1 mg total) by mouth 2 (two) times daily as needed for anxiety. Saguier, Ramon Dredge, PA-C  Active   famotidine (PEPCID) 20 MG tablet [id number] No Take 1 tablet (20 mg total) by mouth 2 (two) times daily. Saguier, Ramon Dredge, PA-C Taking Active   Discontinued 09/23/20 1402 (Error)   oxyCODONE (ROXICODONE) 5 MG immediate release tablet [id number]  Take 1 tablet (5 mg total) by mouth every 6 (six) hours as needed for breakthrough pain. Jacinto Halim, [place]  Active   Patient not taking:  Discontinued 09/23/20 1402  (Error)   Vitamin D, Ergocalciferol, (DRISDOL) 1.25 MG (50000 UNIT) CAPS capsule [id number]  Take 1 capsule (50,000 Units total) by mouth every 7 (seven) days. Saguier, Ramon Dredge, PA-C  Active             Home Care and Equipment/Supplies: Were Home Health Services Ordered?: NA Any new equipment or medical supplies ordered?: NA  Functional Questionnaire: Do you need assistance with bathing/showering or dressing?: No Do you need assistance with meal preparation?: No Do you need assistance with eating?: No Do you have difficulty maintaining continence: No Do you need assistance with getting out of bed/getting out of a chair/moving?: No Do you have difficulty managing or taking your medications?: No  Follow up appointments reviewed: PCP Follow-up appointment confirmed?: Yes Date of PCP follow-up appointment?: 02/26/23 Follow-up Provider: [facility] Follow-up appointment confirmed?: Yes Date of Specialist follow-up appointment?: 02/12/23 Follow-Up Specialty Provider:: surgeon Do you need transportation to your follow-up appointment?: No Do you understand care options if your condition(s) worsen?: Yes-patient verbalized understanding    SIGNATURE Karena Addison, LPN [facility] Nurse Health Advisor Direct Dial [phone]

## 2023-02-19 NOTE — Telephone Encounter (Signed)
Requesting: Adderall 10mg  and clonazepam 1mg  Contract: 11/08/22 UDS: 10/24/22 Last Visit: 11/22/22 Next Visit: 02/26/23 Last Refill Adderall 10mg  01/01/23 #60 and 0RF Last Refill clonazepam 12/21/22 #60 and 0RF  Please Advise

## 2023-02-20 ENCOUNTER — Other Ambulatory Visit (HOSPITAL_BASED_OUTPATIENT_CLINIC_OR_DEPARTMENT_OTHER): Payer: Self-pay

## 2023-02-20 ENCOUNTER — Other Ambulatory Visit: Payer: Self-pay

## 2023-02-26 ENCOUNTER — Ambulatory Visit (INDEPENDENT_AMBULATORY_CARE_PROVIDER_SITE_OTHER): Payer: 59 | Admitting: Medical

## 2023-02-26 ENCOUNTER — Ambulatory Visit (HOSPITAL_BASED_OUTPATIENT_CLINIC_OR_DEPARTMENT_OTHER)
Admission: RE | Admit: 2023-02-26 | Discharge: 2023-02-26 | Disposition: A | Discharge status: Home / Self Care | Payer: 59 | Source: Ambulatory Visit | Attending: Medical | Admitting: Medical

## 2023-02-26 ENCOUNTER — Other Ambulatory Visit (HOSPITAL_BASED_OUTPATIENT_CLINIC_OR_DEPARTMENT_OTHER): Payer: Self-pay

## 2023-02-26 ENCOUNTER — Encounter: Payer: Self-pay | Admitting: Medical

## 2023-02-26 VITALS — BP 123/73 | HR 84 | Temp 97.1°F | Resp 18 | Ht 73.0 in | Wt 264.8 lb

## 2023-02-26 DIAGNOSIS — R109 Unspecified abdominal pain: Secondary | ICD-10-CM | POA: Diagnosis not present

## 2023-02-26 DIAGNOSIS — R1011 Right upper quadrant pain: Secondary | ICD-10-CM | POA: Insufficient documentation

## 2023-02-26 DIAGNOSIS — Z9049 Acquired absence of other specified parts of digestive tract: Secondary | ICD-10-CM | POA: Diagnosis not present

## 2023-02-26 LAB — COMPREHENSIVE METABOLIC PANEL
AG Ratio: 2.1 (calc) (ref 1.0–2.5)
ALT: 19 U/L (ref 9–46)
AST: 18 U/L (ref 10–40)
Albumin: 4.5 g/dL (ref 3.6–5.1)
Alkaline phosphatase (APISO): 71 U/L (ref 36–130)
BUN: 12 mg/dL (ref 7–25)
CO2: 29 mmol/L (ref 20–32)
Calcium: 9.2 mg/dL (ref 8.6–10.3)
Chloride: 106 mmol/L (ref 98–110)
Creat: 1.09 mg/dL (ref 0.60–1.24)
Globulin: 2.1 g/dL (calc) (ref 1.9–3.7)
Glucose, Bld: 79 mg/dL (ref 65–99)
Potassium: 4.8 mmol/L (ref 3.5–5.3)
Sodium: 140 mmol/L (ref 135–146)
Total Bilirubin: 0.8 mg/dL (ref 0.2–1.2)
Total Protein: 6.6 g/dL (ref 6.1–8.1)

## 2023-02-26 LAB — CBC WITH DIFFERENTIAL/PLATELET
Absolute Monocytes: 640 cells/uL (ref 200–950)
Basophils Absolute: 64 cells/uL (ref 0–200)
Basophils Relative: 0.8 %
Eosinophils Absolute: 824 cells/uL — ABNORMAL HIGH (ref 15–500)
Eosinophils Relative: 10.3 %
HCT: 43.5 % (ref 38.5–50.0)
Hemoglobin: 14.6 g/dL (ref 13.2–17.1)
Lymphs Abs: 2392 cells/uL (ref 850–3900)
MCH: 30.9 pg (ref 27.0–33.0)
MCHC: 33.6 g/dL (ref 32.0–36.0)
MCV: 92 fL (ref 80.0–100.0)
MPV: 12.9 fL — ABNORMAL HIGH (ref 7.5–12.5)
Monocytes Relative: 8 %
Neutro Abs: 4080 cells/uL (ref 1500–7800)
Neutrophils Relative %: 51 %
Platelets: 221 10*3/uL (ref 140–400)
RBC: 4.73 10*6/uL (ref 4.20–5.80)
RDW: 11.7 % (ref 11.0–15.0)
Total Lymphocyte: 29.9 %
WBC: 8 10*3/uL (ref 3.8–10.8)

## 2023-02-26 LAB — LIPASE: Lipase: 85 U/L — ABNORMAL HIGH (ref 7–60)

## 2023-02-26 MED ORDER — TRAMADOL HCL 50 MG PO TABS
50.0000 mg | ORAL_TABLET | Freq: Four times a day (QID) | ORAL | 0 refills | Status: AC | PRN
  Filled 2023-02-26: qty 12, 3d supply, fill #0

## 2023-02-26 NOTE — Addendum Note (Signed)
Addended by: Gwenevere Abbot on: 02/26/2023 03:24 PM   Modules accepted: Orders, Level of Service

## 2023-02-26 NOTE — Addendum Note (Signed)
Addended by: Thelma Barge D on: 02/26/2023 04:41 PM   Modules accepted: Orders

## 2023-02-26 NOTE — Addendum Note (Signed)
Addended by: Mervin Kung A on: 02/26/2023 03:31 PM   Modules accepted: Orders

## 2023-02-26 NOTE — Progress Notes (Addendum)
Subjective:    Patient ID: Tracy Rich, male    DOB: 1996/05/06, 27 y.o.   MRN: [id number]  HPI  Pt in for follow up.  Pt had rt upper quadrant pain and had surgery. " Assessment/Plan This is a 27 y.o. male who presented with 3-day history of intermittent epigastric and RUQ abdominal pain with radiation to his back with associated nausea and vomiting.  Most recent episode has persisted and he is with epigastric/RUQ ttp on exam with equivocal Murphy's sign on exam. In the ED he was afebrile without tachycardia or hypotension.  WBC 8.2.  Lipase 54.  T. bili 1.3.  LFTs otherwise within normal limits.  RUQ ultrasound with sludge and + murphy's sign. Gallbladder wall 3mm. No pericholecystic fluid. CBD 3.76mm.  I do suspect that his symptoms are coming from his gallbladder. He may have developing/early acute cholecystitis and I discussed with him the recommendation for laparoscopic cholecystectomy.  I have explained the procedure, risks, and aftercare of Laparoscopic cholecystectomy.  Risks include but are not limited to anesthesia (MI, CVA, death, prolonged intubation and aspiration), bleeding, infection, wound problems, hernia, bile leak, injury to common bile duct/liver/intestine, possible need for subtotal cholecystectomy or open cholecystectomy, increased risk of DVT/PE and diarrhea post op. He seems to understand and agrees to proceed. Keep NPO. Start IV abx. Possible d/c from PACU. If requires admission postoperatively, will plan admission to observation.    I reviewed nursing notes, last 24 h vitals and pain scores, last 48 h intake and output, last 24 h labs and trends, and last 24 h imaging results."   Pt states since surgery but he has pain in upper abdomen area below rt side lower rib and above 2.5 cm from trocar site. Pain lies flat on back, worse on rt side, bending over, laughing or coughing. Pt states pain less if stands straight. No fever, no chills or bodyaches. No nausea or  vomiting. No change in appetite. Pt told he did not have any gallstones but only had sludge. After eating pain in abdomen does not change.  2 weeks since surgery. He has appointment with surgeon next week.       Review of Systems  Constitutional:  Negative for chills, fatigue and fever.  Respiratory:  Negative for cough, choking, wheezing and stridor.   Cardiovascular:  Negative for chest pain and palpitations.  Gastrointestinal:  Positive for abdominal pain. Negative for constipation, diarrhea and nausea.       No pain after eating.  Genitourinary:  Negative for flank pain and frequency.  Musculoskeletal:  Negative for back pain, joint swelling, myalgias and neck stiffness.  Skin:  Negative for rash.  Neurological:  Negative for light-headedness.  Hematological:  Negative for adenopathy. Does not bruise/bleed easily.  Psychiatric/Behavioral:  Negative for behavioral problems and decreased concentration.      Past Medical History:  Diagnosis Date   Anxiety    Hydradenitis 01/03/2021   Patient states history of hidradenitis suppurativa     Social History   Socioeconomic History   Marital status: Single    Spouse name: Not on file   Number of children: Not on file   Years of education: Not on file   Highest education level: Not on file  Occupational History   Not on file  Tobacco Use   Smoking status: Former    Types: Cigarettes   Smokeless tobacco: Never  Vaping Use   Vaping status: Never Used  Substance and Sexual Activity  Alcohol use: No   Drug use: No   Sexual activity: Not on file  Other Topics Concern   Not on file  Social History Narrative   Caffeine- once every other day   Social Determinants of Health   Financial Resource Strain: Not on file  Food Insecurity: Not on file  Transportation Needs: Not on file  Physical Activity: Not on file  Stress: Not on file  Social Connections: Unknown (11/15/2021)   Received from [facility]    Social Network    Social Network: Not on file  Intimate Partner Violence: Unknown (10/07/2021)   [facility]   HITS    Physically Hurt: Not on file    Insult or Talk Down To: Not on file    Threaten Physical Harm: Not on file    Scream or Curse: Not on file    Past Surgical History:  Procedure Laterality Date   CHOLECYSTECTOMY N/A 02/12/2023   Procedure: LAPAROSCOPIC CHOLECYSTECTOMY;  Surgeon: Berna Bue, MD;  [facility];  Service: General;  Laterality: N/A;   INTRAOPERATIVE CHOLANGIOGRAM N/A 02/12/2023   Procedure: INTRAOPERATIVE CHOLANGIOGRAM;  Surgeon: Berna Bue, MD;  Location: [facility] OR;  Service: General;  Laterality: N/A;   TONSILLECTOMY     WISDOM TOOTH EXTRACTION      Family History  Problem Relation Age of Onset   Thyroid disease Mother    Cancer Mother    Hypertension Mother    Thyroid cancer Mother    GER disease Mother    Healthy Father    GER disease Father    Healthy Sister    Esophageal cancer Neg Hx    Colon cancer Neg Hx     No Known Allergies  Current Outpatient Medications on File Prior to Visit  Medication Sig Dispense Refill   acetaminophen (TYLENOL) 500 MG tablet Take 2 tablets (1,000 mg total) by mouth every 8 (eight) hours as needed.     amphetamine-dextroamphetamine (ADDERALL) 10 MG tablet Take 1 tablet (10 mg total) by mouth 2 (two) times daily with a meal. 60 tablet 0   clonazePAM (KLONOPIN) 1 MG tablet Take 1 tablet (1 mg total) by mouth 2 (two) times daily as needed for anxiety. 60 tablet 1   famotidine (PEPCID) 20 MG tablet Take 1 tablet (20 mg total) by mouth 2 (two) times daily. 60 tablet 2   oxyCODONE (ROXICODONE) 5 MG immediate release tablet Take 1 tablet (5 mg total) by mouth every 6 (six) hours as needed for breakthrough pain. 15 tablet 0   Vitamin D, Ergocalciferol, (DRISDOL) 1.25 MG (50000 UNIT) CAPS capsule Take 1 capsule (50,000 Units total) by mouth every 7 (seven) days. 8 capsule 0    [DISCONTINUED] metoprolol succinate (TOPROL-XL) 25 MG 24 hr tablet Take 1 tablet (25 mg total) by mouth daily. 30 tablet 3   [DISCONTINUED] sertraline (ZOLOFT) 25 MG tablet Take 1 tablet (25 mg total) by mouth daily. (Patient not taking: No sig reported) 30 tablet 0   No current facility-administered medications on file prior to visit.    BP 123/73   Pulse 84   Temp (!) 97.1 F (36.2 C) (Temporal)   Resp 18   Ht 6\' 1"  (1.854 m)   Wt 264 lb 12.8 oz (120.1 kg)   SpO2 99%   BMI 34.94 kg/m        Objective:   Physical Exam  General Mental Status- Alert. General Appearance- Not in acute distress.  Skin General: Color- Normal Color. Moisture- Normal Moisture.  Neck Carotid Arteries- Normal color. Moisture- Normal Moisture. No carotid bruits. No JVD.  Chest and Lung Exam Auscultation: Breath Sounds:-Normal.  Cardiovascular Auscultation:Rythm- Regular. Murmurs & Other Heart Sounds:Auscultation of the heart reveals- No Murmurs.  Abdomen Inspection:-Inspeection Normal. Palpation/Percussion:Note:No mass. Palpation and Percussion of the abdomen reveal- Non Tender, Non Distended + BS, no rebound or guarding. Point tender moderate to severe pain 2.5 cm approximate above trocar site of surgery below  rt lower rib. On exam no obvious hernia. No dc from trocar sites. Pain may just be muscle or nerve pain. Get blow labs today and xray.   Neurologic Cranial Nerve exam:- CN III-XII intact(No nystagmus), symmetric smile. Strength:- 5/5 equal and symmetric strength both upper and lower extremities.    Derm- slight circular rash at site where EKG lead may have been placed.     Assessment & Plan:   Patient Instructions  Right upper quadrant /Abdominal pain  Pain 2.5 cm approximate above trocar site of surgery below  rt lower rib. On exam no obvious hernia. No dc from trocar sites. Pain may just be muscle or nerve pain. Get blow labs today and xray. Appointment with surgeon next week.  If pending appointment pain get worse constant, fever, nausea or vomiting would go ahead and get ct abdomen. Presently want to see if surgeon would think needed. But will get before if indicated.  - CBC w/Diff - Comp Met (CMET) - Lipase - DG Abd 2 Views; Future  On discussion did send in tramadol to help with pain. If not adequate let me know.  Follow up date to be determined after lab review or sooner if needed.    [name], PA-C   Time spent with patient today was 40  minutes which consisted of chart revdiew, discussing diagnosis, work up treatment and documentation.

## 2023-02-26 NOTE — Patient Instructions (Addendum)
Right upper quadrant /Abdominal pain  Pain 2.5 cm approximate above trocar site of surgery below  rt lower rib. On exam no obvious hernia. No dc from trocar sites. Pain may just be muscle or nerve pain. Get blow labs today and xray. Appointment with surgeon next week. If pending appointment pain get worse constant, fever, nausea or vomiting would go ahead and get ct abdomen. Presently want to see if surgeon would think needed. But will get before if indicated.  - CBC w/Diff - Comp Met (CMET) - Lipase - DG Abd 2 Views; Future  On discussion did send in tramadol to help with pain. If not adequate let me know.  Follow up date to be determined after lab review or sooner if needed.   After lab review elevated lipase and pt still in pain ordere ct abd pelvis to evalute abd/pancrease.

## 2023-02-26 NOTE — Addendum Note (Signed)
Addended by: Thelma Barge D on: 02/26/2023 04:43 PM   Modules accepted: Orders

## 2023-02-27 ENCOUNTER — Ambulatory Visit (INDEPENDENT_AMBULATORY_CARE_PROVIDER_SITE_OTHER): Payer: 59 | Admitting: Medical

## 2023-02-27 ENCOUNTER — Encounter: Payer: Self-pay | Admitting: Medical

## 2023-02-27 ENCOUNTER — Ambulatory Visit (HOSPITAL_BASED_OUTPATIENT_CLINIC_OR_DEPARTMENT_OTHER)
Admission: RE | Admit: 2023-02-27 | Discharge: 2023-02-27 | Disposition: A | Discharge status: Home / Self Care | Payer: 59 | Source: Ambulatory Visit | Attending: Medical | Admitting: Medical

## 2023-02-27 ENCOUNTER — Other Ambulatory Visit (HOSPITAL_BASED_OUTPATIENT_CLINIC_OR_DEPARTMENT_OTHER): Payer: Self-pay

## 2023-02-27 VITALS — BP 132/78 | HR 63 | Temp 98.3°F | Resp 14 | Ht 73.0 in | Wt 262.2 lb

## 2023-02-27 DIAGNOSIS — Z0000 Encounter for general adult medical examination without abnormal findings: Secondary | ICD-10-CM | POA: Diagnosis not present

## 2023-02-27 DIAGNOSIS — Z111 Encounter for screening for respiratory tuberculosis: Secondary | ICD-10-CM

## 2023-02-27 DIAGNOSIS — Z13220 Encounter for screening for lipoid disorders: Secondary | ICD-10-CM

## 2023-02-27 DIAGNOSIS — J0100 Acute maxillary sinusitis, unspecified: Secondary | ICD-10-CM | POA: Diagnosis not present

## 2023-02-27 DIAGNOSIS — H6690 Otitis media, unspecified, unspecified ear: Secondary | ICD-10-CM

## 2023-02-27 DIAGNOSIS — K859 Acute pancreatitis without necrosis or infection, unspecified: Secondary | ICD-10-CM | POA: Diagnosis not present

## 2023-02-27 DIAGNOSIS — Z23 Encounter for immunization: Secondary | ICD-10-CM | POA: Diagnosis not present

## 2023-02-27 DIAGNOSIS — R109 Unspecified abdominal pain: Secondary | ICD-10-CM | POA: Diagnosis not present

## 2023-02-27 DIAGNOSIS — R1011 Right upper quadrant pain: Secondary | ICD-10-CM | POA: Insufficient documentation

## 2023-02-27 DIAGNOSIS — R748 Abnormal levels of other serum enzymes: Secondary | ICD-10-CM | POA: Diagnosis not present

## 2023-02-27 MED ORDER — AMOXICILLIN-POT CLAVULANATE 875-125 MG PO TABS
1.0000 | ORAL_TABLET | Freq: Two times a day (BID) | ORAL | 0 refills | Status: DC
  Filled 2023-02-27: qty 20, 10d supply, fill #0

## 2023-02-27 NOTE — Addendum Note (Signed)
Addended by: Gwenevere Abbot on: 02/27/2023 02:55 PM   Modules accepted: Orders

## 2023-02-27 NOTE — Addendum Note (Signed)
Addended by: Maximino Sarin on: 02/27/2023 03:12 PM   Modules accepted: Orders

## 2023-02-27 NOTE — Progress Notes (Signed)
Subjective:    Patient ID: Tracy Rich, male    DOB: 27-Sep-1995, 27 y.o.   MRN: [id number]  HPI  Pt in for wellness exam.  Pt in massage training program. No regular exercise only walking his dog. Moderate healthy diet. Smoker- 1/4-1/2 pack a day. No alcohol.   Pt had cbc and cmp yesterday. Pt can't remember if ate anything today. He states some mild light headed after labs yesterday.   Pt due to tdap. Pt [place] vaccine records printed.    Past 3 days nasal congestion and rt ear pain. Yesterday moderate to severe pain. Maxillary sinus pain.       Review of Systems  Constitutional:  Negative for chills, fatigue and fever.  Respiratory:  Negative for cough, chest tightness, shortness of breath and wheezing.   Cardiovascular:  Negative for chest pain and palpitations.  Gastrointestinal:  Positive for abdominal pain. Negative for constipation, nausea and vomiting.       See hpi.  Genitourinary:  Negative for dysuria, genital sores and penile pain.  Musculoskeletal:  Negative for back pain and myalgias.  Neurological:  Negative for dizziness, speech difficulty and light-headedness.  Hematological:  Negative for adenopathy. Does not bruise/bleed easily.  Psychiatric/Behavioral:  Negative for behavioral problems and dysphoric mood.     Past Medical History:  Diagnosis Date   Anxiety    Hydradenitis 01/03/2021   Patient states history of hidradenitis suppurativa     Social History   Socioeconomic History   Marital status: Single    Spouse name: Not on file   Number of children: Not on file   Years of education: Not on file   Highest education level: Not on file  Occupational History   Not on file  Tobacco Use   Smoking status: Former    Types: Cigarettes   Smokeless tobacco: Never  Vaping Use   Vaping status: Never Used  Substance and Sexual Activity   Alcohol use: No   Drug use: No   Sexual activity: Not on file  Other Topics Concern   Not on file  Social  History Narrative   Caffeine- once every other day   Social Determinants of Health   Financial Resource Strain: Not on file  Food Insecurity: Not on file  Transportation Needs: Not on file  Physical Activity: Not on file  Stress: Not on file  Social Connections: Unknown (11/15/2021)   Received from [facility]   Social Network    Social Network: Not on file  Intimate Partner Violence: Unknown (10/07/2021)   Received from [facility]   HITS    Physically Hurt: Not on file    Insult or Talk Down To: Not on file    Threaten Physical Harm: Not on file    Scream or Curse: Not on file    Past Surgical History:  Procedure Laterality Date   CHOLECYSTECTOMY N/A 02/12/2023   Procedure: LAPAROSCOPIC CHOLECYSTECTOMY;  Surgeon: Berna Bue, MD;  Location: [facility] OR;  Service: General;  Laterality: N/A;   INTRAOPERATIVE CHOLANGIOGRAM N/A 02/12/2023   Procedure: INTRAOPERATIVE CHOLANGIOGRAM;  Surgeon: Berna Bue, MD;  Location: [facility] OR;  Service: General;  Laterality: N/A;   TONSILLECTOMY     WISDOM TOOTH EXTRACTION      Family History  Problem Relation Age of Onset   Thyroid disease Mother    Cancer Mother    Hypertension Mother    Thyroid cancer Mother    GER disease Mother  Healthy Father    GER disease Father    Healthy Sister    Esophageal cancer Neg Hx    Colon cancer Neg Hx     No Known Allergies  Current Outpatient Medications on File Prior to Visit  Medication Sig Dispense Refill   acetaminophen (TYLENOL) 500 MG tablet Take 2 tablets (1,000 mg total) by mouth every 8 (eight) hours as needed.     amphetamine-dextroamphetamine (ADDERALL) 10 MG tablet Take 1 tablet (10 mg total) by mouth 2 (two) times daily with a meal. 60 tablet 0   clonazePAM (KLONOPIN) 1 MG tablet Take 1 tablet (1 mg total) by mouth 2 (two) times daily as needed for anxiety. 60 tablet 1   famotidine (PEPCID) 20 MG tablet Take 1 tablet (20 mg total) by mouth 2 (two)  times daily. 60 tablet 2   traMADol (ULTRAM) 50 MG tablet Take 1 tablet (50 mg total) by mouth every 6 (six) hours as needed for up to 5 days. 12 tablet 0   Vitamin D, Ergocalciferol, (DRISDOL) 1.25 MG (50000 UNIT) CAPS capsule Take 1 capsule (50,000 Units total) by mouth every 7 (seven) days. 8 capsule 0   [DISCONTINUED] metoprolol succinate (TOPROL-XL) 25 MG 24 hr tablet Take 1 tablet (25 mg total) by mouth daily. 30 tablet 3   [DISCONTINUED] sertraline (ZOLOFT) 25 MG tablet Take 1 tablet (25 mg total) by mouth daily. (Patient not taking: No sig reported) 30 tablet 0   No current facility-administered medications on file prior to visit.    BP 132/78   Pulse 63   Temp 98.3 F (36.8 C)   Resp 14   Ht 6\' 1"  (1.854 m)   Wt 262 lb 3.2 oz (118.9 kg)   SpO2 99%   BMI 34.59 kg/m        Objective:   Physical Exam  General Mental Status- Alert. General Appearance- Not in acute distress.   Skin General: Color- Normal Color. Moisture- Normal Moisture.  Neck Carotid Arteries- Normal color. Moisture- Normal Moisture. No carotid bruits. No JVD.  Chest and Lung Exam Auscultation: Breath Sounds:-Normal.  Cardiovascular Auscultation:Rythm- Regular. Murmurs & Other Heart Sounds:Auscultation of the heart reveals- No Murmurs.  Abdomen Inspection:-Inspeection Normal. Palpation/Percussion:Note:No mass. Palpation and Percussion of the abdomen reveal- Non Tender, Non Distended + BS, no rebound or guarding.    Neurologic Cranial Nerve exam:- CN III-XII intact(No nystagmus), symmetric smile. Drift Test:- No drift. Romberg Exam:- Negative.  Heal to Toe Gait exam:-Normal. Finger to Nose:- Normal/Intact Strength:- 5/5 equal and symmetric strength both upper and lower extremities.   Heent- maxillary sinus pressure. Rt ear canal clear- rt tm moderate red and dull.    Assessment & Plan:   For you wellness exam today I have ordered lipid panel and tb gold test(for school program. Cbc and  cmp done yesterday for abd complaint visit.  Vaccine given today tdap. 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 Not good idea to get today as you describe possible near vasovagal syncope yesterday. 3 vaccines may be excessive.   Recommend exercise and healthy diet.  We will let you know lab results as they come in.  For sinus infection rx flonase nasal spray and augmentin.   Follow up date appointment will be determined after lab review.    [name], [place]   [id number] as did treat sinus and ear infection. Rx flonase and augmentin.

## 2023-02-27 NOTE — Addendum Note (Signed)
Addended by: Gwenevere Abbot on: 02/27/2023 03:25 PM   Modules accepted: Orders

## 2023-02-27 NOTE — Patient Instructions (Addendum)
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 Cbc and cmp done yesterday for abd complaint visit.  Vaccine given today tdap. 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 Not good idea to get today as you describe possible near vasovagal syncope yesterday. 3 vaccines may be excessive.   Recommend exercise and healthy diet.  We will let you know lab results as they come in.  For sinus infection rx flonase nasal spray and augmentin.   Follow up date appointment will be determined after lab review.    Preventive Care 47-68 Years Old, Male Preventive care refers to lifestyle choices and visits with your health care provider that can promote health and wellness. Preventive care visits are also called wellness exams. What can I expect for my preventive care visit? Counseling During your preventive care visit, your health care provider may ask about your: Medical history, including: Past medical problems. Family medical history. Current health, including: Emotional well-being. Home life and relationship well-being. Sexual activity. Lifestyle, including: Alcohol, nicotine or tobacco, and drug use. Access to firearms. Diet, exercise, and sleep habits. Safety issues such as seatbelt and bike helmet use. Sunscreen use. Work and work Astronomer. Physical exam Your health care provider may check your: Height and weight. These may be used to calculate your BMI (body mass index). BMI is a measurement that tells if you are at a healthy weight. Waist circumference. This measures the distance around your waistline. This measurement also tells if you are at a healthy weight and may help predict your risk of certain diseases, such as type 2 diabetes and high blood pressure. Heart rate and blood pressure. Body temperature. Skin for abnormal spots. What immunizations do I need?  Vaccines are usually given  at various ages, according to a schedule. Your health care provider will recommend vaccines for you based on your age, medical history, and lifestyle or other factors, such as travel or where you work. What tests do I need? Screening Your health care provider may recommend screening tests for certain conditions. This may include: Lipid and cholesterol levels. Diabetes screening. This is done by checking your blood sugar (glucose) after you have not eaten for a while (fasting). Hepatitis B test. Hepatitis C test. HIV (human immunodeficiency virus) test. STI (sexually transmitted infection) testing, if you are at risk. Talk with your health care provider about your test results, treatment options, and if necessary, the need for more tests. Follow these instructions at home: Eating and drinking  Eat a healthy diet that includes fresh fruits and vegetables, whole grains, lean protein, and low-fat dairy products. Drink enough fluid to keep your urine pale yellow. Take vitamin and mineral supplements as recommended by your health care provider. Do not drink alcohol if your health care provider tells you not to drink. If you drink alcohol: Limit how much you have to 0-2 drinks a day. Know how much alcohol is in your drink. In the [place] one drink equals one 12 oz bottle of beer (355 mL), one 5 oz glass of wine (148 mL), or one 1 oz glass of hard liquor (44 mL). Lifestyle Brush your teeth every morning and night with fluoride toothpaste. Floss one time each day. Exercise for at least 30 minutes 5 or more days each week. Do not use any products that contain nicotine or tobacco. These products include cigarettes, chewing tobacco, and vaping devices, such as e-cigarettes. If  you need help quitting, ask your health care provider. Do not use drugs. If you are sexually active, practice safe sex. Use a condom or other form of protection to prevent STIs. Find healthy ways to manage stress, such  as: Meditation, yoga, or listening to music. Journaling. Talking to a trusted person. Spending time with friends and family. Minimize exposure to UV radiation to reduce your risk of skin cancer. Safety Always wear your seat belt while driving or riding in a vehicle. Do not drive: If you have been drinking alcohol. Do not ride with someone who has been drinking. If you have been using any mind-altering substances or drugs. While texting. When you are tired or distracted. Wear a helmet and other protective equipment during sports activities. If you have firearms in your house, make sure you follow all gun safety procedures. Seek help if you have been physically or sexually abused. What's next? Go to your health care provider once a year for an annual wellness visit. Ask your health care provider how often you should have your eyes and teeth checked. Stay up to date on all vaccines. This information is not intended to replace advice given to you by your health care provider. Make sure you discuss any questions you have with your health care provider. Document Revised: 12/15/2020 Document Reviewed: 12/15/2020 Elsevier Patient Education  2024 [employer].

## 2023-02-28 LAB — LIPID PANEL
Cholesterol: 131 mg/dL (ref 0–200)
HDL: 47.5 mg/dL (ref >39.00)
LDL Cholesterol: 63 mg/dL (ref 0–99)
NonHDL: 83.21
Total CHOL/HDL Ratio: 3
Triglycerides: 99 mg/dL (ref 0.0–149.0)
VLDL: 19.8 mg/dL (ref 0.0–40.0)

## 2023-03-01 LAB — QUANTIFERON-TB GOLD PLUS
Mitogen-NIL: >10 [IU]/mL
NIL: 0.06 [IU]/mL
QuantiFERON-TB Gold Plus: NEGATIVE
TB1-NIL: 0 [IU]/mL
TB2-NIL: 0.01 [IU]/mL

## 2023-03-04 DIAGNOSIS — Z419 Encounter for procedure for purposes other than remedying health state, unspecified: Secondary | ICD-10-CM | POA: Diagnosis not present

## 2023-03-06 ENCOUNTER — Telehealth: Payer: Self-pay | Admitting: Medical

## 2023-03-06 NOTE — Telephone Encounter (Signed)
Pt would like to know if his paperwork is ready. Please advise.

## 2023-03-08 ENCOUNTER — Other Ambulatory Visit (HOSPITAL_BASED_OUTPATIENT_CLINIC_OR_DEPARTMENT_OTHER): Payer: Self-pay

## 2023-03-08 NOTE — Telephone Encounter (Signed)
Forms faxed

## 2023-03-22 ENCOUNTER — Other Ambulatory Visit (HOSPITAL_BASED_OUTPATIENT_CLINIC_OR_DEPARTMENT_OTHER): Payer: Self-pay

## 2023-03-22 ENCOUNTER — Other Ambulatory Visit: Payer: Self-pay | Admitting: Medical

## 2023-03-22 ENCOUNTER — Other Ambulatory Visit: Payer: Self-pay

## 2023-03-22 NOTE — Telephone Encounter (Signed)
Prescription Request  03/22/2023  Is this a "Controlled Substance" medicine? Yes  LOV: 02/27/2023  What is the name of the medication or equipment? amphetamine-dextroamphetamine (ADDERALL) 10 MG tablet  Have you contacted your pharmacy to request a refill? No   Which pharmacy would you like this sent to?  [facility] [address], [address] [place] [address] Phone: [phone] Fax: [phone]   Patient notified that their request is being sent to the clinical staff for review and that they should receive a response within 2 business days.   Please advise at [facility] [phone]

## 2023-03-23 MED ORDER — AMPHETAMINE-DEXTROAMPHETAMINE 10 MG PO TABS
10.0000 mg | ORAL_TABLET | Freq: Two times a day (BID) | ORAL | 0 refills | Status: DC
  Filled 2023-03-23: qty 60, 30d supply, fill #0

## 2023-03-23 NOTE — Telephone Encounter (Signed)
Requesting: adderall Contract:11/08/22 UDS:11/08/22 Last Visit:02/27/23 Next Visit:n/a Last Refill:02/19/23  Please Advise

## 2023-03-23 NOTE — Addendum Note (Signed)
Addended by: Maximino Sarin on: 03/23/2023 06:59 AM   Modules accepted: Orders

## 2023-03-23 NOTE — Addendum Note (Signed)
Addended by: Gwenevere Abbot on: 03/23/2023 06:51 PM   Modules accepted: Orders

## 2023-03-23 NOTE — Telephone Encounter (Signed)
Rx refill sent to pharmacy.

## 2023-03-26 ENCOUNTER — Other Ambulatory Visit (HOSPITAL_BASED_OUTPATIENT_CLINIC_OR_DEPARTMENT_OTHER): Payer: Self-pay

## 2023-03-26 ENCOUNTER — Other Ambulatory Visit: Payer: Self-pay

## 2023-04-03 DIAGNOSIS — Z419 Encounter for procedure for purposes other than remedying health state, unspecified: Secondary | ICD-10-CM | POA: Diagnosis not present

## 2023-04-20 ENCOUNTER — Encounter: Payer: Self-pay | Admitting: Medical

## 2023-04-20 ENCOUNTER — Other Ambulatory Visit: Payer: Self-pay | Admitting: Medical

## 2023-04-20 NOTE — Addendum Note (Signed)
Addended by: Maximino Sarin on: 04/20/2023 11:27 AM   Modules accepted: Orders

## 2023-04-20 NOTE — Telephone Encounter (Signed)
Prescription Request  04/20/2023  Is this a "Controlled Substance" medicine? Yes  LOV: 02/27/2023  What is the name of the medication or equipment?   Rx #: [id number]  famotidine (PEPCID) 20 MG tablet [[id number]]   Rx #: [id number]  Vitamin D, Ergocalciferol, (DRISDOL) 1.25 MG (50000 UNIT) CAPS capsule [[id number]]   Rx #: [id number]  clonazePAM (KLONOPIN) 1 MG tablet [[id number]]   Rx #: [id number]  amphetamine-dextroamphetamine (ADDERALL) 10 MG tablet [[id number]]   Have you contacted your pharmacy to request a refill? No   Which pharmacy would you like this sent to?   [facility] - [facility] [address], [address] [place] [address] Phone: [phone] Fax: [phone]  Patient notified that their request is being sent to the clinical staff for review and that they should receive a response within 2 business days.   Please advise at Mobile [phone] (mobile)

## 2023-04-20 NOTE — Telephone Encounter (Signed)
Requesting: adderrall & klonopin  Contract:11/08/22 UDS:11/08/22 Last Visit:02/27/23 Next Visit:n/a Last Refill:03/23/23 & 02/19/23  Please Advise

## 2023-04-21 ENCOUNTER — Other Ambulatory Visit: Payer: Self-pay

## 2023-04-21 MED ORDER — CLONAZEPAM 1 MG PO TABS
1.0000 mg | ORAL_TABLET | Freq: Two times a day (BID) | ORAL | 1 refills | Status: DC | PRN
  Filled 2023-04-21 – 2023-04-23 (×2): qty 60, 30d supply, fill #0
  Filled 2023-05-29: qty 60, 30d supply, fill #1

## 2023-04-21 MED ORDER — AMPHETAMINE-DEXTROAMPHETAMINE 10 MG PO TABS
10.0000 mg | ORAL_TABLET | Freq: Two times a day (BID) | ORAL | 0 refills | Status: DC
  Filled 2023-04-21 – 2023-04-23 (×2): qty 60, 30d supply, fill #0

## 2023-04-21 MED ORDER — FAMOTIDINE 20 MG PO TABS
20.0000 mg | ORAL_TABLET | Freq: Two times a day (BID) | ORAL | 2 refills | Status: DC
  Filled 2023-04-21: qty 60, 30d supply, fill #0
  Filled 2023-05-29: qty 60, 30d supply, fill #1

## 2023-04-21 NOTE — Telephone Encounter (Signed)
Rx refill of both meds sent.

## 2023-04-21 NOTE — Addendum Note (Signed)
Addended by: Gwenevere Abbot on: 04/21/2023 05:35 AM   Modules accepted: Orders

## 2023-04-23 ENCOUNTER — Other Ambulatory Visit: Payer: Self-pay

## 2023-04-23 ENCOUNTER — Other Ambulatory Visit (HOSPITAL_BASED_OUTPATIENT_CLINIC_OR_DEPARTMENT_OTHER): Payer: Self-pay

## 2023-04-23 DIAGNOSIS — S0990XA Unspecified injury of head, initial encounter: Secondary | ICD-10-CM | POA: Diagnosis not present

## 2023-04-23 DIAGNOSIS — R55 Syncope and collapse: Secondary | ICD-10-CM | POA: Diagnosis not present

## 2023-04-23 DIAGNOSIS — G4489 Other headache syndrome: Secondary | ICD-10-CM | POA: Diagnosis not present

## 2023-04-23 DIAGNOSIS — S199XXA Unspecified injury of neck, initial encounter: Secondary | ICD-10-CM | POA: Diagnosis not present

## 2023-04-23 DIAGNOSIS — S060X1A Concussion with loss of consciousness of 30 minutes or less, initial encounter: Secondary | ICD-10-CM | POA: Diagnosis not present

## 2023-04-23 MED ORDER — VITAMIN D (ERGOCALCIFEROL) 1.25 MG (50000 UNIT) PO CAPS
50000.0000 [IU] | ORAL_CAPSULE | ORAL | 0 refills | Status: AC
  Filled 2023-04-23: qty 8, 56d supply, fill #0

## 2023-04-23 NOTE — Telephone Encounter (Signed)
Pt called back to check status of refill. After reviewing chart, noted that the Vitamin D was not sent in. Advised a message would be sent back to look into this.

## 2023-04-23 NOTE — Telephone Encounter (Signed)
Vitamin d sent

## 2023-04-24 ENCOUNTER — Ambulatory Visit (HOSPITAL_BASED_OUTPATIENT_CLINIC_OR_DEPARTMENT_OTHER)
Admission: RE | Admit: 2023-04-24 | Discharge: 2023-04-24 | Disposition: A | Discharge status: Home / Self Care | Payer: Medicaid Other | Source: Ambulatory Visit | Attending: Medical | Admitting: Medical

## 2023-04-24 ENCOUNTER — Other Ambulatory Visit (HOSPITAL_BASED_OUTPATIENT_CLINIC_OR_DEPARTMENT_OTHER): Payer: Self-pay

## 2023-04-24 ENCOUNTER — Ambulatory Visit (INDEPENDENT_AMBULATORY_CARE_PROVIDER_SITE_OTHER): Payer: Medicaid Other | Admitting: Medical

## 2023-04-24 ENCOUNTER — Encounter: Payer: Self-pay | Admitting: Medical

## 2023-04-24 VITALS — BP 118/78 | HR 54 | Resp 18 | Ht 73.0 in | Wt 262.0 lb

## 2023-04-24 DIAGNOSIS — M25512 Pain in left shoulder: Secondary | ICD-10-CM | POA: Insufficient documentation

## 2023-04-24 DIAGNOSIS — R251 Tremor, unspecified: Secondary | ICD-10-CM | POA: Diagnosis not present

## 2023-04-24 DIAGNOSIS — M545 Low back pain, unspecified: Secondary | ICD-10-CM | POA: Diagnosis not present

## 2023-04-24 DIAGNOSIS — M791 Myalgia, unspecified site: Secondary | ICD-10-CM | POA: Diagnosis not present

## 2023-04-24 DIAGNOSIS — S060X9A Concussion with loss of consciousness of unspecified duration, initial encounter: Secondary | ICD-10-CM | POA: Diagnosis not present

## 2023-04-24 DIAGNOSIS — M5440 Lumbago with sciatica, unspecified side: Secondary | ICD-10-CM

## 2023-04-24 DIAGNOSIS — R0781 Pleurodynia: Secondary | ICD-10-CM | POA: Insufficient documentation

## 2023-04-24 MED ORDER — TRAMADOL HCL 50 MG PO TABS
50.0000 mg | ORAL_TABLET | Freq: Four times a day (QID) | ORAL | 0 refills | Status: AC | PRN
  Filled 2023-04-24: qty 8, 2d supply, fill #0

## 2023-04-24 MED ORDER — DICLOFENAC SODIUM 75 MG PO TBEC
75.0000 mg | DELAYED_RELEASE_TABLET | Freq: Two times a day (BID) | ORAL | 0 refills | Status: DC
  Filled 2023-04-24: qty 14, 7d supply, fill #0

## 2023-04-24 MED ORDER — CYCLOBENZAPRINE HCL 5 MG PO TABS
5.0000 mg | ORAL_TABLET | Freq: Every day | ORAL | 0 refills | Status: DC
  Filled 2023-04-24: qty 5, 5d supply, fill #0

## 2023-04-24 NOTE — Patient Instructions (Addendum)
1. Concussion with loss of consciousness, initial encounter -follow attached post concussion precautions attached below - Ambulatory referral to Neurology  2. Episode of shaking -unclear as to cause. Will refer to neurologist to see if they think eeg indicated to rule of seizure. Pending that study recommend  have others drive you. - Ambulatory referral to Neurology  3. Acute pain of left shoulder -get xray to evaluate shoulder pain - DG Shoulder Left; Future  4. Rib pain on right side -xray rule out rib fracture. - DG Ribs Bilateral W/Chest; Future  5. Rib pain on left side -xray rule out rib fracture - DG Ribs Bilateral W/Chest; Future  6. Myalgia  -rx flexeril low dose 5 mg to use one tablet at night. -diclofenac 75 mg twice daily for 7 days -tramadol  50 mg #8 tab 1 every 8 hour prn severe pain -while on above med stop/hold clonazepam  Follow up in 7-10 days or sooner if needed.  Concussion, Adult  A concussion is a brain injury from a hard, direct hit (trauma) to the head or body. This direct hit causes the brain to shake quickly back and forth inside the skull. This can damage brain cells and cause chemical changes in the brain. A concussion may also be known as a mild traumatic brain injury (TBI). The effects of a concussion can be serious. If you have a concussion, you should be very careful to avoid having a second concussion. What are the causes? This condition is caused by: A direct hit to your head. Sudden movement of your body that causes your brain to move back and forth inside the skull, such as in a car crash. What are the signs or symptoms? The signs of a concussion can be hard to notice. Early on, they may be missed by you, family members, and health care providers. You may look fine on the outside but may act or feel differently. Every head injury is different. Symptoms are usually temporary but may last for days, weeks, or even months. Some symptoms appear  right away, but other symptoms may not show up for hours or days. Physical symptoms Headaches. Dizziness and problems with coordination or balance. Sensitivity to light or noise. Nausea or vomiting. Tiredness (fatigue). Vision or hearing problems. Seizure. Mental and emotional symptoms Irritability or mood changes. Memory problems. Trouble concentrating, organizing, or making decisions. Changes in eating or sleeping patterns. Slowness in thinking, acting or reacting, speaking, or reading. Anxiety or depression. How is this diagnosed? This condition is diagnosed based on your symptoms and injury. You may also have tests, including: Imaging tests, such as a CT scan or an MRI. Neuropsychological tests. These measure your thinking, understanding, learning, and memory. How is this treated? Treatment for this condition includes: Stopping sports or activity if you are injured. Physical and mental rest and careful observation, usually at home. Medicines to help with symptoms such as headaches, nausea, or difficulty sleeping. Referral to a concussion clinic or rehab center. Follow these instructions at home: Activity Limit activities that require a lot of thought or concentration, such as: Doing homework or job-related work. Watching TV. Using the computer or phone. Playing memory games and doing puzzles. Rest helps your brain heal. Make sure you: Get plenty of sleep. Most adults should get 7-9 hours of sleep each night. Rest during the day. Take naps or rest breaks when you feel tired. Avoid high-intensity exercise or physical activities that take a lot of effort. Stop any activity that worsens  symptoms. Your health care provider may recommend light exercise such as walking. Do not do high-risk activities that could cause a second concussion, such as riding a bike or playing sports. Ask your health care provider when you can return to your normal activities, such as school, work,  sports, and driving. Your ability to react may be slower after a brain injury. Never do these activities if you are dizzy. General instructions  Take over-the-counter and prescription medicines only as told by your health care provider. Some medicines, such as blood thinners (anticoagulants) and aspirin, may increase the risk for complications, such as bleeding. Avoid taking opioid pain medicine while recovering from a concussion. Do not drink alcohol until your health care provider says you can. Drinking alcohol may slow your recovery and can put you at risk of further injury. Watch your symptoms and tell others around you to do the same. Complications sometimes occur after a concussion. Tell your work Production designer, theatre/television/film, teachers, Tax adviser, school counselor, coach, or sports trainer about your injury, symptoms, and restrictions. See a mental health therapist if you feel anxious or depressed. Managing this condition can be challenging. Keep all follow-up visits. Your health care provider will check on your recovery and give you a plan for returning to activities. How is this prevented? Avoiding another brain injury is very important. In rare cases, another injury can lead to permanent brain damage, brain swelling, or death. The risk of this is greatest during the first 7-10 days after a head injury. Avoid injuries by: Stopping activities that could lead to a second concussion, such as contact or recreational sports, until your health care provider says it is okay. Taking these actions once you have returned to sports or activities: Avoid plays or moves that can cause you to crash into another person. This is how most concussions occur. Follow the rules and be respectful of other players. Do not engage in violent or illegal plays. Getting regular exercise that includes strength and balance training. Wearing a properly fitting helmet during sports, biking, or other activities. Helmets can help protect you  from serious skull and brain injuries, but they may not protect you from a concussion. Even when wearing a helmet, you should avoid being hit in the head. Where to find more information Centers for Disease Control and Prevention: TonerPromos.no Contact a health care provider if: Your symptoms do not improve or get worse. You have new symptoms. You have another injury. Your coordination gets worse. You have unusual behavior changes. Get help right away if: You have a severe or worsening headache. You have weakness or numbness in any part of your body, slurred speech, vision changes, or confusion. You vomit repeatedly. You lose consciousness, are sleepier than normal, or are difficult to wake up. You have a seizure. These symptoms may be an emergency. Get help right away. Call 911. Do not wait to see if the symptoms will go away. Do not drive yourself to the hospital. Also, get help right away if: You have thoughts of hurting yourself or others. Take one of these steps if you feel like you may hurt yourself or others, or have thoughts about taking your own life: Go to your nearest emergency room. Call 911. Call the National Suicide Prevention Lifeline at 269-258-9926 or 988. This is open 24 hours a day. Text the Crisis Text Line at 2523486010. This information is not intended to replace advice given to you by your health care provider. Make sure you discuss any questions  you have with your health care provider. Document Revised: 11/11/2021 Document Reviewed: 11/11/2021 Elsevier Patient Education  2024 [employer].
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
## 2023-04-25 ENCOUNTER — Other Ambulatory Visit (HOSPITAL_BASED_OUTPATIENT_CLINIC_OR_DEPARTMENT_OTHER): Payer: Self-pay
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
## 2023-05-02 ENCOUNTER — Ambulatory Visit: Payer: Medicaid Other | Admitting: Neurology

## 2023-05-04 DIAGNOSIS — Z419 Encounter for procedure for purposes other than remedying health state, unspecified: Secondary | ICD-10-CM | POA: Diagnosis not present

## 2023-05-08 ENCOUNTER — Ambulatory Visit: Payer: Medicaid Other | Admitting: Medical

## 2023-05-08 VITALS — BP 120/78 | HR 68 | Resp 18 | Ht 73.0 in | Wt 262.8 lb

## 2023-05-08 DIAGNOSIS — R5383 Other fatigue: Secondary | ICD-10-CM

## 2023-05-08 NOTE — Patient Instructions (Addendum)
Fatigue, unspecified type Hx of low b12 so this may the cause. Will go ahead and do other labs as well. If b12 level low discussed treatment options. - B12 - CBC w/Diff - Comp Met (CMET) - TSH - T4, free  -iron  Anxiety stable. -pt is on clonazepam. Pt is up to date on contract and uds.  Follow up date  to be determined after lab review.
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
## 2023-05-09 LAB — COMPREHENSIVE METABOLIC PANEL
ALT: 18 U/L (ref 0–53)
AST: 17 U/L (ref 0–37)
Albumin: 4.7 g/dL (ref 3.5–5.2)
Alkaline Phosphatase: 72 U/L (ref 39–117)
BUN: 10 mg/dL (ref 6–23)
CO2: 29 meq/L (ref 19–32)
Calcium: 9.6 mg/dL (ref 8.4–10.5)
Chloride: 104 meq/L (ref 96–112)
Creatinine, Ser: 1.11 mg/dL (ref 0.40–1.50)
GFR: 91.81 mL/min (ref >60.00)
Glucose, Bld: 96 mg/dL (ref 70–99)
Potassium: 4.9 meq/L (ref 3.5–5.1)
Sodium: 139 meq/L (ref 135–145)
Total Bilirubin: 1.4 mg/dL — ABNORMAL HIGH (ref 0.2–1.2)
Total Protein: 6.9 g/dL (ref 6.0–8.3)

## 2023-05-09 LAB — CBC WITH DIFFERENTIAL/PLATELET
Basophils Absolute: 0.1 10*3/uL (ref 0.0–0.1)
Basophils Relative: 1.1 % (ref 0.0–3.0)
Eosinophils Absolute: 0.7 10*3/uL (ref 0.0–0.7)
Eosinophils Relative: 11.8 % — ABNORMAL HIGH (ref 0.0–5.0)
HCT: 45.8 % (ref 39.0–52.0)
Hemoglobin: 15 g/dL (ref 13.0–17.0)
Lymphocytes Relative: 28.2 % (ref 12.0–46.0)
Lymphs Abs: 1.7 10*3/uL (ref 0.7–4.0)
MCHC: 32.8 g/dL (ref 30.0–36.0)
MCV: 93.5 fL (ref 78.0–100.0)
Monocytes Absolute: 0.6 10*3/uL (ref 0.1–1.0)
Monocytes Relative: 9.8 % (ref 3.0–12.0)
Neutro Abs: 2.9 10*3/uL (ref 1.4–7.7)
Neutrophils Relative %: 49.1 % (ref 43.0–77.0)
Platelets: 209 10*3/uL (ref 150.0–400.0)
RBC: 4.9 Mil/uL (ref 4.22–5.81)
RDW: 12.9 % (ref 11.5–15.5)
WBC: 6 10*3/uL (ref 4.0–10.5)

## 2023-05-09 LAB — IRON: Iron: 119 ug/dL (ref 42–165)

## 2023-05-09 LAB — T4, FREE: Free T4: 0.79 ng/dL (ref 0.60–1.60)

## 2023-05-09 LAB — VITAMIN B12: Vitamin B-12: 181 pg/mL — ABNORMAL LOW (ref 211–911)

## 2023-05-09 LAB — TSH: TSH: 1.54 u[IU]/mL (ref 0.35–5.50)

## 2023-05-14 ENCOUNTER — Ambulatory Visit (INDEPENDENT_AMBULATORY_CARE_PROVIDER_SITE_OTHER): Payer: Medicaid Other

## 2023-05-14 DIAGNOSIS — E538 Deficiency of other specified B group vitamins: Secondary | ICD-10-CM

## 2023-05-14 MED ORDER — CYANOCOBALAMIN 1000 MCG/ML IJ SOLN
1000.0000 ug | Freq: Once | INTRAMUSCULAR | Status: AC
  Administered 2023-05-14: 1000 ug via INTRAMUSCULAR

## 2023-05-14 NOTE — Progress Notes (Signed)
Tracy Rich is a 27 y.o. male presents to the office today for initial B12 injections # 1 of 4 weekly, per physician's orders.  Original order: on lab results from 05/08/23 ".Recommend getting set up for weekly b12 vitamin injection over next 4 weeks followed by monthly injection for 5 months. Then repeat b12 level in 6 months"  Cyanocobalamin (med), 1000 mg/ml (dose),  im (route) was administered left deltoid  (location) today. Patient tolerated injection  Patient next injection due: in one week, appt made Yes for 05/21/23.   Wilford Corner

## 2023-05-21 ENCOUNTER — Ambulatory Visit (INDEPENDENT_AMBULATORY_CARE_PROVIDER_SITE_OTHER): Payer: Medicaid Other | Admitting: *Deleted

## 2023-05-21 DIAGNOSIS — E538 Deficiency of other specified B group vitamins: Secondary | ICD-10-CM

## 2023-05-21 MED ORDER — CYANOCOBALAMIN 1000 MCG/ML IJ SOLN
1000.0000 ug | Freq: Once | INTRAMUSCULAR | Status: AC
  Administered 2023-05-21: 1000 ug via INTRAMUSCULAR

## 2023-05-21 NOTE — Progress Notes (Signed)
Tracy Rich is a 27 y.o. male presents to the office today for B12 injection # 2 of 4 weekly, per physician's orders.   Original order: on lab results from 05/08/23 ".Recommend getting set up for weekly b12 vitamin injection over next 4 weeks followed by monthly injection for 5 months. Then repeat b12 level in 6 months"   B12 1000 mg/ml left deltoid today. Patient tolerated injection   Patient next injection due: in one week, appt made Yes for 05/28/23

## 2023-05-28 ENCOUNTER — Ambulatory Visit: Payer: Medicaid Other

## 2023-05-28 NOTE — Progress Notes (Deleted)
Patient here for #3 of 4 weekly b12 injection per physicians order.  Injection given in left deltoid and patient tolerated well.

## 2023-05-29 ENCOUNTER — Other Ambulatory Visit (HOSPITAL_BASED_OUTPATIENT_CLINIC_OR_DEPARTMENT_OTHER): Payer: Self-pay

## 2023-05-30 ENCOUNTER — Ambulatory Visit (INDEPENDENT_AMBULATORY_CARE_PROVIDER_SITE_OTHER): Payer: Medicaid Other

## 2023-05-30 DIAGNOSIS — E538 Deficiency of other specified B group vitamins: Secondary | ICD-10-CM | POA: Diagnosis not present

## 2023-05-30 MED ORDER — CYANOCOBALAMIN 1000 MCG/ML IJ SOLN
1000.0000 ug | Freq: Once | INTRAMUSCULAR | Status: AC
  Administered 2023-05-30: 1000 ug via INTRAMUSCULAR

## 2023-05-30 NOTE — Progress Notes (Signed)
Pt here for weekly (3) B12 injection per PCP  B12 given IM R deltoid, and pt tolerated injection well.  Next B12 injection scheduled for 06/04/2023

## 2023-06-03 DIAGNOSIS — Z419 Encounter for procedure for purposes other than remedying health state, unspecified: Secondary | ICD-10-CM | POA: Diagnosis not present

## 2023-06-04 ENCOUNTER — Other Ambulatory Visit: Payer: Self-pay

## 2023-06-04 ENCOUNTER — Ambulatory Visit (INDEPENDENT_AMBULATORY_CARE_PROVIDER_SITE_OTHER): Payer: Medicaid Other

## 2023-06-04 DIAGNOSIS — E538 Deficiency of other specified B group vitamins: Secondary | ICD-10-CM

## 2023-06-04 MED ORDER — CYANOCOBALAMIN 1000 MCG/ML IJ SOLN
1000.0000 ug | Freq: Once | INTRAMUSCULAR | Status: AC
  Administered 2023-06-04: 1000 ug via INTRAMUSCULAR

## 2023-06-04 MED ORDER — AMPHETAMINE-DEXTROAMPHETAMINE 10 MG PO TABS
10.0000 mg | ORAL_TABLET | Freq: Two times a day (BID) | ORAL | 0 refills | Status: DC
  Filled 2023-06-04: qty 60, 30d supply, fill #0

## 2023-06-04 NOTE — Telephone Encounter (Signed)
Rx refill adderall sent to pt pharmacy

## 2023-06-04 NOTE — Progress Notes (Signed)
Patient is in office today for a nurse visit for B12 Injection. Patient Injection was given in the  Left deltoid. Patient tolerated injection well.

## 2023-06-04 NOTE — Telephone Encounter (Signed)
Requesting: adderall Contract:11/08/22 UDS:11/08/22 Last Visit:05/08/23 Next Visit:n/a Last Refill:04/21/23  Please Advise

## 2023-06-05 ENCOUNTER — Other Ambulatory Visit (HOSPITAL_BASED_OUTPATIENT_CLINIC_OR_DEPARTMENT_OTHER): Payer: Self-pay

## 2023-06-11 ENCOUNTER — Other Ambulatory Visit (HOSPITAL_BASED_OUTPATIENT_CLINIC_OR_DEPARTMENT_OTHER): Payer: Self-pay

## 2023-06-11 ENCOUNTER — Encounter: Payer: Self-pay | Admitting: Medical

## 2023-06-11 ENCOUNTER — Ambulatory Visit (INDEPENDENT_AMBULATORY_CARE_PROVIDER_SITE_OTHER): Payer: Medicaid Other | Admitting: Medical

## 2023-06-11 VITALS — BP 120/70 | HR 66 | Resp 18 | Ht 73.0 in | Wt 266.6 lb

## 2023-06-11 DIAGNOSIS — F988 Other specified behavioral and emotional disorders with onset usually occurring in childhood and adolescence: Secondary | ICD-10-CM | POA: Diagnosis not present

## 2023-06-11 DIAGNOSIS — E669 Obesity, unspecified: Secondary | ICD-10-CM | POA: Diagnosis not present

## 2023-06-11 DIAGNOSIS — Z6835 Body mass index (BMI) 35.0-35.9, adult: Secondary | ICD-10-CM

## 2023-06-11 DIAGNOSIS — R748 Abnormal levels of other serum enzymes: Secondary | ICD-10-CM | POA: Diagnosis not present

## 2023-06-11 MED ORDER — METFORMIN HCL 500 MG PO TABS
500.0000 mg | ORAL_TABLET | Freq: Every day | ORAL | 0 refills | Status: DC
  Filled 2023-06-11: qty 30, 30d supply, fill #0

## 2023-06-11 NOTE — Progress Notes (Signed)
Subjective:    Patient ID: Tracy Rich, male    DOB: [birth year], 27 y.o.   MRN: [id number]  HPI  Discussed the use of AI scribe software for clinical note transcription with the patient, who gave verbal consent to proceed.  History of Present Illness   The patient presents with a desire for weight loss, expressing dissatisfaction with his current weight of 266 lbs, which has increased from a previous weight of 230 lbs. This weight loss was attributed to a period of heightened anxiety and panic attacks that affected his appetite. The patient has attempted weight loss strategies in the past, including the use of a Weight Watchers app, but reports no significant exercise regimen. He expresses a strong, persistent appetite, even when feeling full, and is seeking an appetite suppressant. He has concerns about potential side effects, as family members experienced adverse reactions to phentermine.  The patient has a family history of thyroid cancer, which is relevant due to potential contraindications for certain weight loss medications. He also has a history of elevated pancreatic protein levels, which were investigated with a CT scan that showed no abnormalities. The patient denies any current abdominal pain.    He also reports taking Adderall 10mg  twice daily for concentration, which he feels is adequate at the current dose.  The patient has no history of diabetes but has a family history of the condition on his father's side. He reports feeling better overall when he had previously lost weight, with improvements in back pain. He is currently classified as obese based on his BMI.        Review of Systems  Constitutional:  Negative for chills and fatigue.  HENT:  Negative for congestion and ear pain.   Respiratory:  Negative for cough, chest tightness, shortness of breath and wheezing.   Cardiovascular:  Negative for chest pain and palpitations.  Gastrointestinal:  Negative for abdominal  pain, blood in stool, diarrhea, nausea and vomiting.  Genitourinary:  Negative for dysuria, flank pain and frequency.  Musculoskeletal:  Negative for back pain, joint swelling and neck pain.  Skin:  Negative for rash.  Neurological:  Negative for dizziness, seizures, weakness and headaches.  Hematological:  Negative for adenopathy. Does not bruise/bleed easily.  Psychiatric/Behavioral:  Positive for decreased concentration. Negative for behavioral problems. The patient is nervous/anxious.        Controlled.     Past Medical History:  Diagnosis Date   Anxiety    Hydradenitis 01/03/2021   Patient states history of hidradenitis suppurativa     Social History   Socioeconomic History   Marital status: Single    Spouse name: Not on file   Number of children: Not on file   Years of education: Not on file   Highest education level: Not on file  Occupational History   Not on file  Tobacco Use   Smoking status: Former    Types: Cigarettes   Smokeless tobacco: Never  Vaping Use   Vaping status: Never Used  Substance and Sexual Activity   Alcohol use: No   Drug use: No   Sexual activity: Not on file  Other Topics Concern   Not on file  Social History Narrative   Caffeine- once every other day   Social Determinants of Health   Financial Resource Strain: Not on file  Food Insecurity: Not on file  Transportation Needs: Not on file  Physical Activity: Not on file  Stress: Not on file  Social Connections: Unknown (11/15/2021)  Received from [facility], [facility]   Social Network    Social Network: Not on file  Intimate Partner Violence: Unknown (10/07/2021)   Received from [facility], [facility]   HITS    Physically Hurt: Not on file    Insult or Talk Down To: Not on file    Threaten Physical Harm: Not on file    Scream or Curse: Not on file    Past Surgical History:  Procedure Laterality Date   CHOLECYSTECTOMY N/A 02/12/2023   Procedure: LAPAROSCOPIC  CHOLECYSTECTOMY;  Surgeon: Berna Bue, MD;  Location: [facility] OR;  Service: General;  Laterality: N/A;   INTRAOPERATIVE CHOLANGIOGRAM N/A 02/12/2023   Procedure: INTRAOPERATIVE CHOLANGIOGRAM;  Surgeon: Berna Bue, MD;  Location: [facility] OR;  Service: General;  Laterality: N/A;   TONSILLECTOMY     WISDOM TOOTH EXTRACTION      Family History  Problem Relation Age of Onset   Thyroid disease Mother    Cancer Mother    Hypertension Mother    Thyroid cancer Mother    GER disease Mother    Healthy Father    GER disease Father    Healthy Sister    Esophageal cancer Neg Hx    Colon cancer Neg Hx     No Known Allergies  Current Outpatient Medications on File Prior to Visit  Medication Sig Dispense Refill   acetaminophen (TYLENOL) 500 MG tablet Take 2 tablets (1,000 mg total) by mouth every 8 (eight) hours as needed.     amoxicillin-clavulanate (AUGMENTIN) 875-125 MG tablet Take 1 tablet by mouth 2 (two) times daily. 20 tablet 0   amphetamine-dextroamphetamine (ADDERALL) 10 MG tablet Take 1 tablet (10 mg total) by mouth 2 (two) times daily with a meal. 60 tablet 0   clonazePAM (KLONOPIN) 1 MG tablet Take 1 tablet (1 mg total) by mouth 2 (two) times daily as needed for anxiety. 60 tablet 1   cyclobenzaprine (FLEXERIL) 5 MG tablet Take 1 tablet (5 mg total) by mouth at bedtime. 5 tablet 0   diclofenac (VOLTAREN) 75 MG EC tablet Take 1 tablet (75 mg total) by mouth 2 (two) times daily. 14 tablet 0   famotidine (PEPCID) 20 MG tablet Take 1 tablet (20 mg total) by mouth 2 (two) times daily. 60 tablet 2   Vitamin D, Ergocalciferol, (DRISDOL) 1.25 MG (50000 UNIT) CAPS capsule Take 1 capsule (50,000 Units total) by mouth every 7 (seven) days. 8 capsule 0   [DISCONTINUED] metoprolol succinate (TOPROL-XL) 25 MG 24 hr tablet Take 1 tablet (25 mg total) by mouth daily. 30 tablet 3   [DISCONTINUED] sertraline (ZOLOFT) 25 MG tablet Take 1 tablet (25 mg total) by mouth daily. (Patient not taking: No sig  reported) 30 tablet 0   No current facility-administered medications on file prior to visit.    BP 120/70   Pulse 66   Resp 18   Ht 6\' 1"  (1.854 m)   Wt 266 lb 9.6 oz (120.9 kg)   SpO2 98%   BMI 35.17 kg/m        Objective:   Physical Exam  General Mental Status- Alert. General Appearance- Not in acute distress.   Skin General: Color- Normal Color. Moisture- Normal Moisture.  Neck Carotid Arteries- Normal color. Moisture- Normal Moisture. No carotid bruits. No JVD.  Chest and Lung Exam Auscultation: Breath Sounds:-Normal.  Cardiovascular Auscultation:Rythm- Regular. Murmurs & Other Heart Sounds:Auscultation of the heart reveals- No Murmurs.  Abdomen Inspection:-Inspeection Normal. Palpation/Percussion:Note:No mass. Palpation and Percussion  of the abdomen reveal- Non Tender, Non Distended + BS, no rebound or guarding.   Neurologic Cranial Nerve exam:- CN III-XII intact(No nystagmus), symmetric smile. Strength:- 5/5 equal and symmetric strength both upper and lower extremities.       Assessment & Plan:   Assessment and Plan    Obesity Patient desires weight loss. Previously successful with weight loss during a period of anxiety. Currently struggling with appetite control. Family history of thyroid cancer precludes use of certain weight loss medications glp-1 -Start Metformin 500mg  daily for one month, then increase to twice daily if tolerated. -Continue use of Weight Watchers or My Fitness Pal app. -Encourage daily exercise for 30 minutes, 5 days a week. -Check weight in one month and communicate via MytriMessage.  Attention Deficit Hyperactivity Disorder (ADHD) Currently managed on Adderall 10mg  twice daily. Patient reports adequate concentration. -Continue current dose. -Consider future dose adjustment if concentration is not adequate, pending blood pressure and pulse check on medication.  Elevated Pancreatic Enzymes Previous elevation of lipase. No  current abdominal pain. -Repeat lipase in one month to assess if levels have normalized.  Controlled Medication Follow-up Patient on Adderall, a controlled medication. also anxiety controlled with clonazepam. -Ensure patient has a controlled medication visit every six months as per guidelines. -Confirm date of last controlled medication visit.        [name], PA-C

## 2023-06-11 NOTE — Patient Instructions (Addendum)
Obesity Patient desires weight loss. Previously successful with weight loss during a period of anxiety. Currently struggling with appetite control. Family history of thyroid cancer precludes use of certain weight loss medications glp-1 -Start Metformin 500mg  daily for one month, then increase to twice daily if tolerated. -Continue use of Weight Watchers or My Fitness Pal app. -Encourage daily exercise for 30 minutes, 5 days a week. -Check weight in one month and communicate via MytriMessage.  Attention Deficit Hyperactivity Disorder (ADHD) Currently managed on Adderall 10mg  twice daily. Patient reports adequate concentration. -Continue current dose. -Consider future dose adjustment if concentration is not adequate, pending blood pressure and pulse check on medication.  Elevated Pancreatic Enzymes Previous elevation of lipase. No current abdominal pain. -Repeat lipase in one month to assess if levels have normalized.  Controlled Medication Follow-up Patient on Adderall, a controlled medication. also anxiety controlled with clonazepam. -Ensure patient has a controlled medication visit every six months as per guidelines. -Confirm date of last controlled medication visit.  Follow up April for next controlled med visit. Up date contract and uds on that date.

## 2023-06-19 ENCOUNTER — Other Ambulatory Visit (HOSPITAL_BASED_OUTPATIENT_CLINIC_OR_DEPARTMENT_OTHER): Payer: Self-pay

## 2023-06-19 ENCOUNTER — Ambulatory Visit (HOSPITAL_BASED_OUTPATIENT_CLINIC_OR_DEPARTMENT_OTHER)
Admission: RE | Admit: 2023-06-19 | Discharge: 2023-06-19 | Disposition: A | Discharge status: Home / Self Care | Payer: Medicaid Other | Source: Ambulatory Visit | Attending: Medical | Admitting: Medical

## 2023-06-19 ENCOUNTER — Ambulatory Visit (INDEPENDENT_AMBULATORY_CARE_PROVIDER_SITE_OTHER): Payer: Medicaid Other | Admitting: Medical

## 2023-06-19 VITALS — BP 128/82 | HR 64 | Temp 98.2°F | Resp 18 | Ht 73.0 in | Wt 270.0 lb

## 2023-06-19 DIAGNOSIS — R058 Other specified cough: Secondary | ICD-10-CM | POA: Diagnosis not present

## 2023-06-19 DIAGNOSIS — J40 Bronchitis, not specified as acute or chronic: Secondary | ICD-10-CM | POA: Diagnosis not present

## 2023-06-19 DIAGNOSIS — R059 Cough, unspecified: Secondary | ICD-10-CM | POA: Insufficient documentation

## 2023-06-19 DIAGNOSIS — R0989 Other specified symptoms and signs involving the circulatory and respiratory systems: Secondary | ICD-10-CM | POA: Diagnosis not present

## 2023-06-19 DIAGNOSIS — R0981 Nasal congestion: Secondary | ICD-10-CM

## 2023-06-19 DIAGNOSIS — R062 Wheezing: Secondary | ICD-10-CM | POA: Diagnosis not present

## 2023-06-19 DIAGNOSIS — J029 Acute pharyngitis, unspecified: Secondary | ICD-10-CM

## 2023-06-19 LAB — POCT INFLUENZA A/B
Influenza A, POC: NEGATIVE
Influenza B, POC: NEGATIVE

## 2023-06-19 LAB — POCT RAPID STREP A (OFFICE): Rapid Strep A Screen: NEGATIVE

## 2023-06-19 LAB — POC COVID19 BINAXNOW: SARS Coronavirus 2 Ag: NEGATIVE

## 2023-06-19 MED ORDER — ALBUTEROL SULFATE HFA 108 (90 BASE) MCG/ACT IN AERS
2.0000 | INHALATION_SPRAY | Freq: Four times a day (QID) | RESPIRATORY_TRACT | 0 refills | Status: DC | PRN
  Filled 2023-06-19: qty 18, 30d supply, fill #0

## 2023-06-19 MED ORDER — AZITHROMYCIN 250 MG PO TABS
ORAL_TABLET | ORAL | 0 refills | Status: AC
  Filled 2023-06-19: qty 6, 5d supply, fill #0

## 2023-06-19 MED ORDER — BENZONATATE 100 MG PO CAPS
100.0000 mg | ORAL_CAPSULE | Freq: Three times a day (TID) | ORAL | 0 refills | Status: DC | PRN
  Filled 2023-06-19: qty 30, 10d supply, fill #0

## 2023-06-19 NOTE — Progress Notes (Signed)
   Subjective:    Patient ID: Tracy Rich, male    DOB: [birth year], 27 y.o.   MRN: [id number]  HPI  Discussed the use of AI scribe software for clinical note transcription with the patient, who gave verbal consent to proceed.  History of Present Illness   The patient, a 27 year old smoker, presents with a two-day history of respiratory symptoms. He reports a sore throat, chest discomfort, and difficulty breathing, describing an inability to take a full deep breath. The patient also notes a hoarse voice upon waking and a wheezing sound, described as 'crackling and popping,' particularly noticeable when lying down.  Despite nasal congestion, the patient denies any significant nasal symptoms. However, he reports productive coughing with significant mucus production, causing substantial discomfort. The patient also mentions generalized body aches and fatigue, indicating a feeling of being 'wiped out.'  The patient has had recent exposure to individuals with rhinovirus, walking pneumonia, and COVID-19. He has previously tested negative for COVID-19 but has experienced less severe symptoms than his previous bout with the virus.  The patient's smoking habits vary, with some days involving no smoking and others involving half a pack. However, he has abstained from smoking for the past two and a half days due to his current symptoms. He also notes that exposure to smoke in his workplace exacerbates his symptoms.  The patient's symptoms began two days ago, and he has been progressively feeling worse. He has previously been prescribed benzonatate for cough.        Review of Systems See hpi    Objective:   Physical Exam  General Mental Status- Alert. General Appearance- Not in acute distress.   Skin General: Color- Normal Color. Moisture- Normal Moisture.  Neck Carotid Arteries- Normal color. Moisture- Normal Moisture. No carotid bruits. No JVD.  Chest and Lung Exam Auscultation: Breath  Sounds:-Normal.  Cardiovascular Auscultation:Rythm- Regular. Murmurs & Other Heart Sounds:Auscultation of the heart reveals- No Murmurs.  Abdomen Inspection:-Inspeection Normal. Palpation/Percussion:Note:No mass. Palpation and Percussion of the abdomen reveal- Non Tender, Non Distended + BS, no rebound or guarding.   Neurologic Cranial Nerve exam:- CN III-XII intact(No nystagmus), symmetric smile. Strength:- 5/5 equal and symmetric strength both upper and lower extremities.   Heent- moderate nasal congestion.   No sinus pressure. Mild red pharynx. No tonsil hypertrophy.    Assessment & Plan:   Assessment and Plan    Bronchitis-  Symptoms of sore throat, chest congestion, difficulty breathing, wheezing, and productive cough. Exposure to individuals with rhinovirus, walking pneumonia, and COVID-19. Negative for COVID-19, strep, and flu A/B. Red throat on examination and costochondritis likely due to coughing. -Order chest x-ray to rule out pneumonia. -Prescribe azithromycin for potential bronchitis. -Prescribe benzonatate for cough (patient reports previous inefficacy). -Prescribe albuterol inhaler for wheezing. -Advise patient to return to work on Sunday to avoid further irritation from smoke exposure. -some costochondritis as well on exam. can use ibuprofen.  Tobacco Use Patient reports smoking, with variable daily use. Has not smoked in the past 2.5 days due to current illness. -Encourage continued cessation, especially in the context of current respiratory symptoms.   Follow up 7-10 days or sooner if needed

## 2023-06-19 NOTE — Patient Instructions (Signed)
Bronchitis-  Symptoms of sore throat, chest congestion, difficulty breathing, wheezing, and productive cough. Exposure to individuals with rhinovirus, walking pneumonia, and COVID-19. Negative for COVID-19, strep, and flu A/B. Red throat on examination and costochondritis likely due to coughing. -Order chest x-ray to rule out pneumonia. -Prescribe azithromycin for potential bronchitis. -Prescribe benzonatate for cough (patient reports previous inefficacy). -Prescribe albuterol inhaler for wheezing. -Advise patient to return to work on Sunday to avoid further irritation from smoke exposure. -some costochondritis as well on exam. can use ibuprofen.  Tobacco Use Patient reports smoking, with variable daily use. Has not smoked in the past 2.5 days due to current illness. -Encourage continued cessation, especially in the context of current respiratory symptoms.   Follow up 7-10 days or sooner if needed

## 2023-06-28 ENCOUNTER — Other Ambulatory Visit (HOSPITAL_BASED_OUTPATIENT_CLINIC_OR_DEPARTMENT_OTHER): Payer: Self-pay

## 2023-06-28 ENCOUNTER — Encounter: Payer: Self-pay | Admitting: Medical

## 2023-06-28 MED ORDER — CLONAZEPAM 1 MG PO TABS
1.0000 mg | ORAL_TABLET | Freq: Two times a day (BID) | ORAL | 1 refills | Status: DC | PRN
  Filled 2023-06-28: qty 60, 30d supply, fill #0
  Filled 2023-07-30: qty 60, 30d supply, fill #1

## 2023-06-28 NOTE — Telephone Encounter (Signed)
Rx refill clonazepam sent to pharmacy.

## 2023-06-28 NOTE — Telephone Encounter (Signed)
Requesting: klonopin Contract:10/24/22 UDS:10/24/22 Last Visit:06/19/23 Next Visit:07/05/23 Last Refill:04/21/23  Please Advise

## 2023-07-04 DIAGNOSIS — Z419 Encounter for procedure for purposes other than remedying health state, unspecified: Secondary | ICD-10-CM | POA: Diagnosis not present

## 2023-07-05 ENCOUNTER — Telehealth: Payer: Medicaid Other | Admitting: Medical

## 2023-07-05 ENCOUNTER — Other Ambulatory Visit (HOSPITAL_BASED_OUTPATIENT_CLINIC_OR_DEPARTMENT_OTHER): Payer: Self-pay

## 2023-07-05 ENCOUNTER — Encounter: Payer: Self-pay | Admitting: Medical

## 2023-07-05 VITALS — BP 125/62 | HR 72

## 2023-07-05 DIAGNOSIS — E538 Deficiency of other specified B group vitamins: Secondary | ICD-10-CM | POA: Diagnosis not present

## 2023-07-05 DIAGNOSIS — J40 Bronchitis, not specified as acute or chronic: Secondary | ICD-10-CM

## 2023-07-05 DIAGNOSIS — K219 Gastro-esophageal reflux disease without esophagitis: Secondary | ICD-10-CM

## 2023-07-05 DIAGNOSIS — J0100 Acute maxillary sinusitis, unspecified: Secondary | ICD-10-CM

## 2023-07-05 DIAGNOSIS — F17200 Nicotine dependence, unspecified, uncomplicated: Secondary | ICD-10-CM

## 2023-07-05 DIAGNOSIS — F909 Attention-deficit hyperactivity disorder, unspecified type: Secondary | ICD-10-CM

## 2023-07-05 MED ORDER — AMPHETAMINE-DEXTROAMPHETAMINE 20 MG PO TABS
20.0000 mg | ORAL_TABLET | Freq: Two times a day (BID) | ORAL | 0 refills | Status: DC
  Filled 2023-07-05: qty 60, 30d supply, fill #0

## 2023-07-05 MED ORDER — FAMOTIDINE 20 MG PO TABS
20.0000 mg | ORAL_TABLET | Freq: Two times a day (BID) | ORAL | 2 refills | Status: DC
  Filled 2023-07-05: qty 60, 30d supply, fill #0
  Filled 2023-07-30: qty 60, 30d supply, fill #1

## 2023-07-05 NOTE — Patient Instructions (Signed)
 Bronchitis Resolved after treatment with azithromycin , benzonatate , and albuterol . No evidence of pneumonia on prior x-ray. -No further action required at this time.  Sinusitis Developed shortly after bronchitis and resolved without additional treatment. -No further action required at this time.  Costochondritis Resolved after treatment with ibuprofen . -No further action required at this time.  Tobacco Use Continued smoking despite prior discussion of risks and cessation strategies. -Recommended nicotine patches and provided information on 1-800-QUIT-NOW for additional support.  ADHD Patient reports decreased efficacy of current Adderall dose. -Increase Adderall to 20mg  twice daily. -uds and update contract in April as nurse visit.  Gastroesophageal Reflux Disease (GERD) Improved since starting metformin , but still requiring famotidine  4-5 days per week. -Continue famotidine  as needed.  General Health Maintenance / Followup Plans -Continue monthly B12 injections. -Check B12 level at next nurse visit prior to injection. -Schedule nurse visit in early April for drug screen, contract signing, and possible B12 level check. -Follow-up in 6 months for controlled medication visit, or sooner if issues arise.

## 2023-07-05 NOTE — Progress Notes (Signed)
 Subjective:    Patient ID: Tracy Rich, male    DOB: 1996/04/07, 28 y.o.   MRN: [id number]  HPI Virtual Visit via Video Note  I connected with Lynwood KATHEE Rummer on 07/05/23 at  8:20 AM EST by a video enabled telemedicine application and verified that I am speaking with the correct person using two identifiers.  Location: Patient: home  Provider: office both in [place].   I discussed the limitations of evaluation and management by telemedicine and the availability of in person appointments. The patient expressed understanding and agreed to proceed.  History of Present Illness: Discussed the use of AI scribe software for clinical note transcription with the patient, who gave verbal consent to proceed.  History of Present Illness   The patient, a 28 year old with a history of bronchitis, sinusitis, and ADHD, presented for a follow-up visit. He reported a recent episode of bronchitis, which was treated with azithromycin , benzonatate , and albuterol . The patient also had costochondritis, managed with ibuprofen . The patient reported a significant improvement in his bronchitis symptoms, estimating a 99% return to his baseline health. However, he experienced a sinus infection shortly after his last visit, which has since resolved.  The patient also has a history of smoking since the age of 28. Despite a brief cessation period, he has resumed smoking. He has not yet tried nicotine patches and is not currently interested in doing so.  Regarding his ADHD, the patient has been on a low dose of Adderall for approximately a year. He reported that the medication is not as effective as it used to be, with a decrease in concentration noted on most days. The patient is also enrolled in a massage therapist program.  The patient also reported frequent heartburn, which has been managed with famotidine . Interestingly, he noted an improvement in his reflux symptoms since starting metformin . The frequency of  famotidine  use has decreased from daily to about four to five times a week.        Observations/Objective: General-no acute distress, pleasant, oriented. Lungs- on inspection lungs appear unlabored. Neck- no tracheal deviation or jvd on inspection. Neuro- gross motor function appears intact.    Assessment and Plan: Assessment and Plan    Bronchitis Resolved after treatment with azithromycin , benzonatate , and albuterol . No evidence of pneumonia on prior x-ray. -No further action required at this time.  Sinusitis Developed shortly after bronchitis and resolved without additional treatment. -No further action required at this time.  Costochondritis Resolved after treatment with ibuprofen . -No further action required at this time.  Tobacco Use Continued smoking despite prior discussion of risks and cessation strategies. -Recommended nicotine patches and provided information on 1-800-QUIT-NOW for additional support.  ADHD Patient reports decreased efficacy of current Adderall dose. -Increase Adderall to 20mg  twice daily. -uds and update contract in April as nurse visit.  Gastroesophageal Reflux Disease (GERD) Improved since starting metformin , but still requiring famotidine  4-5 days per week. -Continue famotidine  as needed.  General Health Maintenance / Followup Plans -Continue monthly B12 injections. -Check B12 level at next nurse visit prior to injection. -Schedule nurse visit in early April for drug screen, contract signing, and possible B12 level check. -Follow-up in 6 months for controlled medication visit, or sooner if issues arise.        Follow Up Instructions:    I discussed the assessment and treatment plan with the patient. The patient was provided an opportunity to ask questions and all were answered. The patient agreed with the plan and demonstrated  an understanding of the instructions.   The patient was advised to call back or seek an in-person evaluation  if the symptoms worsen or if the condition fails to improve as anticipated.     Dallas Maxwell, PA-C    Review of Systems     Objective:   Physical Exam        Assessment & Plan:

## 2023-07-06 ENCOUNTER — Ambulatory Visit: Payer: Medicaid Other

## 2023-07-11 ENCOUNTER — Encounter: Payer: Self-pay | Admitting: Medical

## 2023-07-11 ENCOUNTER — Other Ambulatory Visit (HOSPITAL_BASED_OUTPATIENT_CLINIC_OR_DEPARTMENT_OTHER): Payer: Self-pay

## 2023-07-11 ENCOUNTER — Ambulatory Visit: Payer: Medicaid Other | Admitting: Medical

## 2023-07-11 VITALS — BP 120/88 | HR 77 | Temp 98.2°F | Resp 18 | Ht 73.0 in | Wt 268.6 lb

## 2023-07-11 DIAGNOSIS — L853 Xerosis cutis: Secondary | ICD-10-CM

## 2023-07-11 DIAGNOSIS — L299 Pruritus, unspecified: Secondary | ICD-10-CM | POA: Diagnosis not present

## 2023-07-11 DIAGNOSIS — L739 Follicular disorder, unspecified: Secondary | ICD-10-CM | POA: Diagnosis not present

## 2023-07-11 DIAGNOSIS — R17 Unspecified jaundice: Secondary | ICD-10-CM

## 2023-07-11 DIAGNOSIS — E538 Deficiency of other specified B group vitamins: Secondary | ICD-10-CM

## 2023-07-11 MED ORDER — DOXYCYCLINE HYCLATE 100 MG PO TABS
100.0000 mg | ORAL_TABLET | Freq: Two times a day (BID) | ORAL | 0 refills | Status: DC
  Filled 2023-07-11: qty 14, 7d supply, fill #0

## 2023-07-11 MED ORDER — HYDROXYZINE HCL 10 MG PO TABS
10.0000 mg | ORAL_TABLET | Freq: Every evening | ORAL | 0 refills | Status: AC | PRN
  Filled 2023-07-11: qty 20, 10d supply, fill #0

## 2023-07-11 MED ORDER — CYANOCOBALAMIN 1000 MCG/ML IJ SOLN
1000.0000 ug | Freq: Once | INTRAMUSCULAR | Status: AC
  Administered 2023-07-11: 1000 ug via INTRAMUSCULAR

## 2023-07-11 NOTE — Patient Instructions (Addendum)
 Abdominal Itching( I think combo dry skin and folliculitis) New onset itching in the abdomen after starting Metformin . No rash, pain, or diarrhea. Possible folliculitis and dry skin. Unclear if related to Metformin  allergy which I think is unusual. -Hold Metformin  for now. -Start Doxycycline  twice a day for 7 days. -Moisturize abdomen twice a day. -Avoid hot showers and use Dove moisturizing soap. -If not significantly improved, consider renewing Doxycycline  for another week and repeat RPR test possible based on hx but don't think recent  rash syphilis like.. -Hydroxyzine  for itching, Rx advisement.  Vitamin B12 Deficiency Last injection over a month ago. -Administer B12 injection today. -Next level check in April.  Elevated Liver Enzyme Fluctuating levels of total bilirubin. -Repeat metabolic panel on 07/23/2023.  ADHD and Anxiety Current contract for Clonazepam  and Adderall to be renewed in April. -Continue current management. -Renew contract in April during follow-up visit.  Follow up 07-22-2022 or sooner if needed.

## 2023-07-11 NOTE — Progress Notes (Signed)
   Subjective:    Patient ID: Tracy Rich, male    DOB: Mar 25, 1996, 28 y.o.   MRN: [id number]  HPI Discussed the use of AI scribe software for clinical note transcription with the patient, who gave verbal consent to proceed.  History of Present Illness   The patient, who was recently started on metformin , presents with a new onset of abdominal itching that began approximately a week after initiating the medication. The itching is localized to the abdomen, with no associated rash, stomach pain, or diarrhea. The patient reports regular bowel movements since starting metformin , a change from his previous pattern. He also notes a decrease in appetite and resolution of previous lower abdominal pain.  The patient has not taken the metformin  for the past two days, but the itching persists. He reports that the itching intensifies about thirty minutes after eating when he was taking the medication. The patient also mentions a lack of sleep due to work commitments, not related to the itching.  In addition to metformin , the patient is also on a monthly B12 injection regimen, with the last injection administered over a month ago. He also has a community education officer for clonazepam  and Adderall, due for renewal in April. The patient has not recently shaved his abdominal area, but did have a waxing procedure in November.  The patient's recent labs showed one elevated liver enzyme, which has shown fluctuation in previous tests. The patient has expressed concern about a previous syphilis rash, but there is no current indication of this.        Review of Systems See hpi    Objective:   Physical Exam  General Mental Status- Alert. General Appearance- Not in acute distress.   Skin General: Color- Normal Color. Moisture- Normal Moisture.  Neck Carotid Arteries- Normal color. Moisture- Normal Moisture. No carotid bruits. No JVD.  Chest and Lung Exam Auscultation: Breath  Sounds:-CTA  Cardiovascular Auscultation:Rythm- RRR Murmurs & Other Heart Sounds:Auscultation of the heart reveals- No Murmurs.  Abdomen Inspection:-Inspeection Normal. Palpation/Percussion:Note:No mass. Palpation and Percussion of the abdomen reveal- Non Tender, Non Distended + BS, no rebound or guarding.   Neurologic Cranial Nerve exam:- CN III-XII intact(No nystagmus), symmetric smile. Strength:- 5/5 equal and symmetric strength both upper and lower extremities.   Skin- some stretch marks on abdomen upper protons . Faint scattered rash on abdomen. Scattered follicles inflamed mildly. No warmth or tenderness.      Assessment & Plan:   Patient Instructions  Abdominal Itching New onset itching in the abdomen after starting Metformin . No rash, pain, or diarrhea. Possible folliculitis and dry skin. Unclear if related to Metformin  allergy which I think is unusual. -Hold Metformin  for now. -Start Doxycycline  twice a day for 7 days. -Moisturize abdomen twice a day. -Avoid hot showers and use Dove moisturizing soap. -If not significantly improved, consider renewing Doxycycline  for another week and repeat RPR test possible based on hx but don't think recent  rash syphilis like.. -Hydroxyzine  for itching, Rx advisement.  Vitamin B12 Deficiency Last injection over a month ago. -Administer B12 injection today. -Next level check in April.  Elevated Liver Enzyme Fluctuating levels of total bilirubin. -Repeat metabolic panel on 07/23/2023.  ADHD and Anxiety Current contract for Clonazepam  and Adderall to be renewed in April. -Continue current management. -Renew contract in April during follow-up visit.  Follow up 07-22-2022 or sooner if needed.   Daira Hine, PA-C

## 2023-07-12 ENCOUNTER — Ambulatory Visit: Payer: Medicaid Other

## 2023-07-24 ENCOUNTER — Ambulatory Visit: Payer: Medicaid Other | Admitting: Medical

## 2023-07-27 ENCOUNTER — Ambulatory Visit (INDEPENDENT_AMBULATORY_CARE_PROVIDER_SITE_OTHER): Payer: Medicaid Other | Admitting: Medical

## 2023-07-27 ENCOUNTER — Other Ambulatory Visit (HOSPITAL_BASED_OUTPATIENT_CLINIC_OR_DEPARTMENT_OTHER): Payer: Self-pay

## 2023-07-27 VITALS — BP 120/68 | HR 57 | Resp 18 | Ht 73.0 in | Wt 273.8 lb

## 2023-07-27 DIAGNOSIS — R351 Nocturia: Secondary | ICD-10-CM

## 2023-07-27 DIAGNOSIS — M5440 Lumbago with sciatica, unspecified side: Secondary | ICD-10-CM

## 2023-07-27 DIAGNOSIS — Z833 Family history of diabetes mellitus: Secondary | ICD-10-CM | POA: Diagnosis not present

## 2023-07-27 MED ORDER — TIZANIDINE HCL 4 MG PO TABS
4.0000 mg | ORAL_TABLET | Freq: Every day | ORAL | 0 refills | Status: AC
  Filled 2023-07-27: qty 5, 5d supply, fill #0

## 2023-07-27 MED ORDER — METHYLPREDNISOLONE 4 MG PO TBPK
ORAL_TABLET | ORAL | 0 refills | Status: DC
  Filled 2023-07-27: qty 21, 6d supply, fill #0

## 2023-07-27 NOTE — Patient Instructions (Addendum)
Lower Back Pain Severe, intermittent pain since October, with recent exacerbation causing functional impairment and radiating pain to the knees. No numbness or weakness. Previous lumbar spine X-ray in October was normal. Likely musculoskeletal in nature. -Start Medrol Dosepak for 6 days to reduce inflammation and pain. -Prescribe Zanaflex 4mg  at night for muscle relaxation. -Provide patient with stretching exercises to do as tolerated. -Request patient update via MyChart on the last day of Medrol treatment to assess response.  Acute Upper Respiratory Infection One day of nasal congestion, hoarse voice, and cough. -Prescribe benzonatate for cough. -Advise continued use of Flonase nasal spray for congestion. -Recommend if symptoms worsen by Sunday do covid test.  Frequent Urination Noted increased urination, particularly at night, possibly related to disrupted sleep due to back pain. Family history of diabetes. -Collect urine sample for culture. -Order PSA blood work to assess prostate health. -Check HbA1c due to family history of diabetes.  Follow up to be determined after my chart update next wednesday or sooner if needed   Preventive Care 48-28 Years Old, Male Preventive care refers to lifestyle choices and visits with your health care provider that can promote health and wellness. Preventive care visits are also called wellness exams. What can I expect for my preventive care visit? Counseling During your preventive care visit, your health care provider may ask about your: Medical history, including: Past medical problems. Family medical history. Current health, including: Emotional well-being. Home life and relationship well-being. Sexual activity. Lifestyle, including: Alcohol, nicotine or tobacco, and drug use. Access to firearms. Diet, exercise, and sleep habits. Safety issues such as seatbelt and bike helmet use. Sunscreen use. Work and work Astronomer. Physical  exam Your health care provider may check your: Height and weight. These may be used to calculate your BMI (body mass index). BMI is a measurement that tells if you are at a healthy weight. Waist circumference. This measures the distance around your waistline. This measurement also tells if you are at a healthy weight and may help predict your risk of certain diseases, such as type 2 diabetes and high blood pressure. Heart rate and blood pressure. Body temperature. Skin for abnormal spots. What immunizations do I need?  Vaccines are usually given at various ages, according to a schedule. Your health care provider will recommend vaccines for you based on your age, medical history, and lifestyle or other factors, such as travel or where you work. What tests do I need? Screening Your health care provider may recommend screening tests for certain conditions. This may include: Lipid and cholesterol levels. Diabetes screening. This is done by checking your blood sugar (glucose) after you have not eaten for a while (fasting). Hepatitis B test. Hepatitis C test. HIV (human immunodeficiency virus) test. STI (sexually transmitted infection) testing, if you are at risk. Talk with your health care provider about your test results, treatment options, and if necessary, the need for more tests. Follow these instructions at home: Eating and drinking  Eat a healthy diet that includes fresh fruits and vegetables, whole grains, lean protein, and low-fat dairy products. Drink enough fluid to keep your urine pale yellow. Take vitamin and mineral supplements as recommended by your health care provider. Do not drink alcohol if your health care provider tells you not to drink. If you drink alcohol: Limit how much you have to 0-2 drinks a day. Know how much alcohol is in your drink. In the [place] one drink equals one 12 oz bottle of beer (355 mL), one 5  oz glass of wine (148 mL), or one 1 oz glass of hard liquor  (44 mL). Lifestyle Brush your teeth every morning and night with fluoride toothpaste. Floss one time each day. Exercise for at least 30 minutes 5 or more days each week. Do not use any products that contain nicotine or tobacco. These products include cigarettes, chewing tobacco, and vaping devices, such as e-cigarettes. If you need help quitting, ask your health care provider. Do not use drugs. If you are sexually active, practice safe sex. Use a condom or other form of protection to prevent STIs. Find healthy ways to manage stress, such as: Meditation, yoga, or listening to music. Journaling. Talking to a trusted person. Spending time with friends and family. Minimize exposure to UV radiation to reduce your risk of skin cancer. Safety Always wear your seat belt while driving or riding in a vehicle. Do not drive: If you have been drinking alcohol. Do not ride with someone who has been drinking. If you have been using any mind-altering substances or drugs. While texting. When you are tired or distracted. Wear a helmet and other protective equipment during sports activities. If you have firearms in your house, make sure you follow all gun safety procedures. Seek help if you have been physically or sexually abused. What's next? Go to your health care provider once a year for an annual wellness visit. Ask your health care provider how often you should have your eyes and teeth checked. Stay up to date on all vaccines. This information is not intended to replace advice given to you by your health care provider. Make sure you discuss any questions you have with your health care provider. Document Revised: 12/15/2020 Document Reviewed: 12/15/2020 Elsevier Patient Education  2024 [employer].

## 2023-07-27 NOTE — Progress Notes (Signed)
Subjective:    Patient ID: Tracy Rich, male    DOB: 1996/02/19, 28 y.o.   MRN: [id number]  HPI Discussed the use of AI scribe software for clinical note transcription with the patient, who gave verbal consent to proceed.  History of Present Illness   The patient, with a history of intermittent lower back pain since October, presents with a recent exacerbation of symptoms. Over the past ten days, the pain has become severe, limiting mobility and causing difficulty with daily activities such as dressing and getting out of the car. The pain is described as shooting, radiating down the back of the legs to the knee level, and is triggered by certain movements. The patient denies any numbness in the feet or weakness in the legs, except when the pain is severe enough to cause immobility. The patient works long hours in a physically active job, which exacerbates the pain.  The patient also reports a recent onset of cold symptoms, including nasal congestion and a mild cough, which started the day of the consultation. Additionally, the patient has been experiencing frequent urination, particularly at night, which they attribute to disrupted sleep due to severe back pain. The patient denies any pain or blood in the urine.  The patient's back pain has been self-managed with ibuprofen and a heating pad, which provides temporary relief. The patient reports no significant relief from previous prescriptions for muscle relaxants. The patient's back pain has been ongoing since a negative lumbar spine x-ray in October. The patient has not had any recent injuries or heavy lifting that could explain the exacerbation of symptoms. The patient's family history includes diabetes.        Review of Systems See hpi    Objective:   Physical Exam  General Appearance- Not in acute distress.    Chest and Lung Exam Auscultation: Breath sounds:-Normal. Clear even and unlabored. Adventitious sounds:- No Adventitious  sounds.  Cardiovascular Auscultation:Rythm - Regular, rate and rythm. Heart Sounds -Normal heart sounds.  Abdomen Inspection:-Inspection Normal.  Palpation/Perucssion: Palpation and Percussion of the abdomen reveal- Non Tender, No Rebound tenderness, No rigidity(Guarding) and No Palpable abdominal masses.  Liver:-Normal.  Spleen:- Normal.   Back  Mid lumbar spine tenderness to palpation. Pain on straight leg lift. Pain on lateral movements and flexion/extension of the spine.  Lower ext neurologic  L5-S1 sensation intact bilaterally. Normal patellar reflexes bilaterally. No foot drop bilaterally.   Heent- no sinus pressure to palpation. Nasal congested. Normal pharynx. Tms not red.    Assessment & Plan:  Lower Back Pain Severe, intermittent pain since October, with recent exacerbation causing functional impairment and radiating pain to the knees. No numbness or weakness. Previous lumbar spine X-ray in October was normal. Likely musculoskeletal in nature. -Start Medrol Dosepak for 6 days to reduce inflammation and pain. -Prescribe Zanaflex 4mg  at night for muscle relaxation. -Provide patient with stretching exercises to do as tolerated. -Request patient update via MyChart on the last day of Medrol treatment to assess response.  Acute Upper Respiratory Infection One day of nasal congestion, hoarse voice, and cough. -Prescribe benzonatate for cough. -Advise continued use of Flonase nasal spray for congestion. --Recommend if symptoms worsen by Sunday do covid test.  Frequent Urination Noted increased urination, particularly at night, possibly related to disrupted sleep due to back pain. Family history of diabetes. -Collect urine sample for culture. -Order PSA blood work to assess prostate health. -Check HbA1c due to family history of diabetes.  Follow up to be determined  after my chart update next wednesday or sooner if needed

## 2023-07-28 ENCOUNTER — Encounter: Payer: Self-pay | Admitting: Medical

## 2023-07-28 LAB — URINE CULTURE
MICRO NUMBER:: 15994696
Result:: NO GROWTH
SPECIMEN QUALITY:: ADEQUATE

## 2023-07-28 LAB — HEMOGLOBIN A1C
Hgb A1c MFr Bld: 5.5 %{Hb} (ref <5.7)
Mean Plasma Glucose: 111 mg/dL
eAG (mmol/L): 6.2 mmol/L

## 2023-07-28 LAB — PSA: PSA: 0.78 ng/mL (ref <=4.00)

## 2023-07-30 ENCOUNTER — Other Ambulatory Visit (HOSPITAL_BASED_OUTPATIENT_CLINIC_OR_DEPARTMENT_OTHER): Payer: Self-pay

## 2023-07-31 ENCOUNTER — Ambulatory Visit: Payer: Medicaid Other | Admitting: Medical

## 2023-08-03 ENCOUNTER — Other Ambulatory Visit (HOSPITAL_BASED_OUTPATIENT_CLINIC_OR_DEPARTMENT_OTHER): Payer: Self-pay

## 2023-08-03 ENCOUNTER — Ambulatory Visit (INDEPENDENT_AMBULATORY_CARE_PROVIDER_SITE_OTHER): Payer: Medicaid Other | Admitting: Medical

## 2023-08-03 ENCOUNTER — Encounter: Payer: Self-pay | Admitting: Medical

## 2023-08-03 VITALS — BP 130/70 | HR 99 | Resp 18 | Ht 73.0 in | Wt 266.0 lb

## 2023-08-03 DIAGNOSIS — M5440 Lumbago with sciatica, unspecified side: Secondary | ICD-10-CM | POA: Diagnosis not present

## 2023-08-03 DIAGNOSIS — F988 Other specified behavioral and emotional disorders with onset usually occurring in childhood and adolescence: Secondary | ICD-10-CM | POA: Diagnosis not present

## 2023-08-03 DIAGNOSIS — R350 Frequency of micturition: Secondary | ICD-10-CM | POA: Diagnosis not present

## 2023-08-03 MED ORDER — AMPHETAMINE-DEXTROAMPHETAMINE 20 MG PO TABS
20.0000 mg | ORAL_TABLET | Freq: Two times a day (BID) | ORAL | 0 refills | Status: DC
  Filled 2023-08-03: qty 60, 30d supply, fill #0

## 2023-08-03 MED ORDER — METFORMIN HCL 500 MG PO TABS
500.0000 mg | ORAL_TABLET | Freq: Every day | ORAL | 0 refills | Status: AC
  Filled 2023-08-03: qty 30, 30d supply, fill #0

## 2023-08-03 NOTE — Patient Instructions (Signed)
Lower Back Pain Significant improvement with lumbar support in car, reducing pain from 8-9/10 to 4-5/10. No significant relief from Medrol or Zanaflex. -Continue conservative management with lumbar support. -Consider physical therapy or sports medicine referral if pain persists in 5-7 days.  ADHD Patient due for Adderall refill. -Refill Adderall prescription as per pharmacy rules.  Frequent Urination Resolved, no signs of diabetes or urinary tract infection, normal prostate protein. -Monitor and report if symptoms recur.  General Health Maintenance / Followup Plans -Plan for controlled medication visit in April, coinciding with B12 shot and blood work.

## 2023-08-03 NOTE — Progress Notes (Signed)
Subjective:    Patient ID: Tracy Rich, male    DOB: 01/30/1996, 28 y.o.   MRN: [id number]  Discussed the use of AI scribe software for clinical note transcription with the patient, who gave verbal consent to proceed.  History of Present Illness   The patient presents with back pain and requests a refill for Adderall.  He experiences significant back pain, initially rated at 8 to 9 out of 10, which has improved to 4 to 5 out of 10 after using a lumbar support cushion in his car. The improvement is attributed to the cushion, noting that his car seat is 'really bucketed,' which may have contributed to the pain. The pain is primarily present when lying down. He has used a Medrol dose pack and Zanaflex, but only the Medrol provided some relief, particularly in the first three days. Zanaflex caused a severe headache similar to his experience with Flexeril.  He is due for a refill of his Adderall prescription for ADHD.  He mentions having nasal congestion and drainage, which improved significantly after taking the Medrol dose pack. He did not use the Flonase nasal spray as the Medrol was effective in alleviating his symptoms.  He reports a recent episode of frequent urination, which has since resolved. He underwent an A1c test, which was not in the diabetic range, and a prostate protein test, which was normal. He believes the frequent urination was related to being sick or getting sick at the time. No current frequent urination.       Past Medical History:  Diagnosis Date   Anxiety    Hydradenitis 01/03/2021   Patient states history of hidradenitis suppurativa     Social History   Socioeconomic History   Marital status: Single    Spouse name: Not on file   Number of children: Not on file   Years of education: Not on file   Highest education level: Not on file  Occupational History   Not on file  Tobacco Use   Smoking status: Former    Types: Cigarettes   Smokeless tobacco: Never   Vaping Use   Vaping status: Never Used  Substance and Sexual Activity   Alcohol use: No   Drug use: No   Sexual activity: Not on file  Other Topics Concern   Not on file  Social History Narrative   Caffeine- once every other day   Social Drivers of Health   Financial Resource Strain: Not on file  Food Insecurity: Not on file  Transportation Needs: Not on file  Physical Activity: Not on file  Stress: Not on file  Social Connections: Unknown (11/15/2021)   Received from [facility]   Social Network    Social Network: Not on file  Intimate Partner Violence: Unknown (10/07/2021)   Received from [facility]   HITS    Physically Hurt: Not on file    Insult or Talk Down To: Not on file    Threaten Physical Harm: Not on file    Scream or Curse: Not on file    Past Surgical History:  Procedure Laterality Date   CHOLECYSTECTOMY N/A 02/12/2023   Procedure: LAPAROSCOPIC CHOLECYSTECTOMY;  Surgeon: Berna Bue, MD;  Location: [facility] OR;  Service: General;  Laterality: N/A;   INTRAOPERATIVE CHOLANGIOGRAM N/A 02/12/2023   Procedure: INTRAOPERATIVE CHOLANGIOGRAM;  Surgeon: Berna Bue, MD;  Location: [facility] OR;  Service: General;  Laterality: N/A;   TONSILLECTOMY     WISDOM TOOTH  EXTRACTION      Family History  Problem Relation Age of Onset   Thyroid disease Mother    Cancer Mother    Hypertension Mother    Thyroid cancer Mother    GER disease Mother    Healthy Father    GER disease Father    Healthy Sister    Esophageal cancer Neg Hx    Colon cancer Neg Hx     No Known Allergies  Current Outpatient Medications on File Prior to Visit  Medication Sig Dispense Refill   clonazePAM (KLONOPIN) 1 MG tablet Take 1 tablet (1 mg total) by mouth 2 (two) times daily as needed for anxiety. 60 tablet 1   doxycycline (VIBRA-TABS) 100 MG tablet Take 1 tablet (100 mg total) by mouth 2 (two) times daily. 14 tablet 0   famotidine (PEPCID) 20 MG tablet Take  1 tablet (20 mg total) by mouth 2 (two) times daily. 60 tablet 2   hydrOXYzine (ATARAX) 10 MG tablet Take 1-2 tablets (10-20 mg total) by mouth at bedtime as needed for itching 20 tablet 0   methylPREDNISolone (MEDROL DOSEPAK) 4 MG TBPK tablet Take by mouth as directed see package for directions 21 each 0   tiZANidine (ZANAFLEX) 4 MG tablet Take 1 tablet (4 mg total) by mouth at bedtime. 5 tablet 0   Vitamin D, Ergocalciferol, (DRISDOL) 1.25 MG (50000 UNIT) CAPS capsule Take 1 capsule (50,000 Units total) by mouth every 7 (seven) days. 8 capsule 0   [DISCONTINUED] metoprolol succinate (TOPROL-XL) 25 MG 24 hr tablet Take 1 tablet (25 mg total) by mouth daily. (Patient not taking: Reported on 07/11/2023) 30 tablet 3   [DISCONTINUED] sertraline (ZOLOFT) 25 MG tablet Take 1 tablet (25 mg total) by mouth daily. (Patient not taking: Reported on 09/15/2020) 30 tablet 0   No current facility-administered medications on file prior to visit.    BP (!) 132/96   Pulse 99   Resp 18   Ht 6\' 1"  (1.854 m)   Wt 266 lb (120.7 kg)   SpO2 100%   BMI 35.09 kg/m   130/70  Review of Systems See hpi.    Objective:   Physical Exam   General- No acute distress. Pleasant patient. Neck- Full range of motion, no jvd Lungs- Clear, even and unlabored. Heart- regular rate and rhythm. Neurologic- CNII- XII grossly intact.  Lumbar- mild mid lumbar tenderness to palpation.     Assessment & Plan:  Assessment and Plan    Lower Back Pain Significant improvement with lumbar support in car, reducing pain from 8-9/10 to 4-5/10. No significant relief from Medrol or Zanaflex. -Continue conservative management with lumbar support. -Consider physical therapy or sports medicine referral if pain persists in 5-7 days.  ADHD Patient due for Adderall refill. -Refill Adderall prescription as per pharmacy rules.  Frequent Urination Resolved, no signs of diabetes or urinary tract infection, normal prostate  protein. -Monitor and report if symptoms recur.  General Health Maintenance / Followup Plans -Plan for controlled medication visit in April, coinciding with B12 shot and blood work.       Tracy Richters, PA-C    Pt request refill of metformin. He is confident now no allergy to med. He took clindamycin and rash resolved.

## 2023-08-04 DIAGNOSIS — Z419 Encounter for procedure for purposes other than remedying health state, unspecified: Secondary | ICD-10-CM | POA: Diagnosis not present

## 2023-08-13 ENCOUNTER — Telehealth: Payer: Self-pay | Admitting: Medical

## 2023-08-13 NOTE — Telephone Encounter (Signed)
 Copied from CRM [id number]. Topic: General - Other >> Aug 13, 2023  1:51 PM Melissa C wrote: Reason for CRM: patient thinks he needs to schedule for B12 shot. Please advise with patient. Thank you

## 2023-08-14 ENCOUNTER — Ambulatory Visit: Payer: Medicaid Other | Admitting: Medical

## 2023-08-14 ENCOUNTER — Ambulatory Visit (INDEPENDENT_AMBULATORY_CARE_PROVIDER_SITE_OTHER): Payer: Medicaid Other | Admitting: *Deleted

## 2023-08-14 DIAGNOSIS — E538 Deficiency of other specified B group vitamins: Secondary | ICD-10-CM | POA: Diagnosis not present

## 2023-08-14 MED ORDER — CYANOCOBALAMIN 1000 MCG/ML IJ SOLN
1000.0000 ug | Freq: Once | INTRAMUSCULAR | Status: AC
  Administered 2023-08-14: 1000 ug via INTRAMUSCULAR

## 2023-08-14 NOTE — Telephone Encounter (Signed)
Pt scheduled

## 2023-08-14 NOTE — Progress Notes (Signed)
Pt here for monthly B12 injection per pcp orders.  Injection given RD without immediate complications.

## 2023-08-24 ENCOUNTER — Ambulatory Visit: Payer: Medicaid Other | Admitting: Medical

## 2023-08-27 ENCOUNTER — Ambulatory Visit: Payer: Medicaid Other | Admitting: Medical

## 2023-09-01 DIAGNOSIS — Z419 Encounter for procedure for purposes other than remedying health state, unspecified: Secondary | ICD-10-CM | POA: Diagnosis not present

## 2023-09-03 ENCOUNTER — Encounter: Payer: Self-pay | Admitting: Medical

## 2023-09-04 NOTE — Telephone Encounter (Signed)
 Requesting: adderall & klonopin  Contract:11/08/22 UDS:11/08/22 Last Visit:08/03/23 Next Visit:09/12/23 Last Refill:adderall - 08/03/23 Klonopin - 06/28/23  Please Advise

## 2023-09-05 ENCOUNTER — Other Ambulatory Visit (HOSPITAL_BASED_OUTPATIENT_CLINIC_OR_DEPARTMENT_OTHER): Payer: Self-pay

## 2023-09-05 ENCOUNTER — Ambulatory Visit: Admitting: Medical

## 2023-09-05 MED ORDER — AMPHETAMINE-DEXTROAMPHETAMINE 20 MG PO TABS
20.0000 mg | ORAL_TABLET | Freq: Two times a day (BID) | ORAL | 0 refills | Status: DC
  Filled 2023-09-05: qty 60, 30d supply, fill #0

## 2023-09-05 MED ORDER — CLONAZEPAM 1 MG PO TABS
1.0000 mg | ORAL_TABLET | Freq: Two times a day (BID) | ORAL | 1 refills | Status: DC | PRN
  Filled 2023-09-05: qty 60, 30d supply, fill #0
  Filled 2023-10-08: qty 60, 30d supply, fill #1

## 2023-09-05 NOTE — Addendum Note (Signed)
 Addended by: Gwenevere Abbot on: 09/05/2023 04:20 PM   Modules accepted: Orders

## 2023-09-05 NOTE — Telephone Encounter (Signed)
 Can you send in patients medication today , pt states he is out of medication

## 2023-09-06 ENCOUNTER — Other Ambulatory Visit (HOSPITAL_BASED_OUTPATIENT_CLINIC_OR_DEPARTMENT_OTHER): Payer: Self-pay

## 2023-09-06 ENCOUNTER — Other Ambulatory Visit: Payer: Self-pay

## 2023-09-10 ENCOUNTER — Ambulatory Visit: Admitting: Medical

## 2023-09-12 ENCOUNTER — Ambulatory Visit: Payer: Medicaid Other

## 2023-09-17 ENCOUNTER — Ambulatory Visit: Admitting: Medical

## 2023-09-18 ENCOUNTER — Other Ambulatory Visit (HOSPITAL_BASED_OUTPATIENT_CLINIC_OR_DEPARTMENT_OTHER): Payer: Self-pay

## 2023-09-18 ENCOUNTER — Encounter: Payer: Self-pay | Admitting: Medical

## 2023-09-18 ENCOUNTER — Ambulatory Visit (INDEPENDENT_AMBULATORY_CARE_PROVIDER_SITE_OTHER): Admitting: Medical

## 2023-09-18 VITALS — BP 130/85 | HR 72 | Resp 18 | Ht 73.0 in | Wt 266.0 lb

## 2023-09-18 DIAGNOSIS — J3489 Other specified disorders of nose and nasal sinuses: Secondary | ICD-10-CM | POA: Diagnosis not present

## 2023-09-18 DIAGNOSIS — J0100 Acute maxillary sinusitis, unspecified: Secondary | ICD-10-CM | POA: Diagnosis not present

## 2023-09-18 DIAGNOSIS — R0981 Nasal congestion: Secondary | ICD-10-CM

## 2023-09-18 DIAGNOSIS — F419 Anxiety disorder, unspecified: Secondary | ICD-10-CM | POA: Diagnosis not present

## 2023-09-18 DIAGNOSIS — E538 Deficiency of other specified B group vitamins: Secondary | ICD-10-CM | POA: Diagnosis not present

## 2023-09-18 DIAGNOSIS — F32A Depression, unspecified: Secondary | ICD-10-CM | POA: Diagnosis not present

## 2023-09-18 LAB — POC COVID19 BINAXNOW: SARS Coronavirus 2 Ag: NEGATIVE

## 2023-09-18 MED ORDER — AZITHROMYCIN 250 MG PO TABS
ORAL_TABLET | ORAL | 0 refills | Status: AC
  Filled 2023-09-18: qty 6, 5d supply, fill #0

## 2023-09-18 MED ORDER — CYANOCOBALAMIN 1000 MCG/ML IJ SOLN
1000.0000 ug | Freq: Once | INTRAMUSCULAR | Status: AC
  Administered 2023-09-18: 1000 ug via INTRAMUSCULAR

## 2023-09-18 MED ORDER — FLUOXETINE HCL 10 MG PO CAPS
10.0000 mg | ORAL_CAPSULE | Freq: Every day | ORAL | 0 refills | Status: DC
  Filled 2023-09-18: qty 30, 30d supply, fill #0

## 2023-09-18 MED ORDER — FLUTICASONE PROPIONATE 50 MCG/ACT NA SUSP
2.0000 | Freq: Every day | NASAL | 1 refills | Status: AC
  Filled 2023-09-18: qty 16, 30d supply, fill #0

## 2023-09-18 NOTE — Addendum Note (Signed)
 Addended by: Maximino Sarin on: 09/18/2023 04:11 PM   Modules accepted: Orders

## 2023-09-18 NOTE — Patient Instructions (Signed)
 Acute Sinusitis Symptoms consistent with acute sinusitis. Azithromycin considered effective based on past outcomes. - Prescribe azithromycin. - Prescribe Flonase nasal spray. - Advise Claritin in seven days if symptoms persist.  Generalized Anxiety Disorder Chronic anxiety with previous Lexapro side effects. Discussed Prozac as an alternative SSRI with potential side effects. If any side effects let me know. Significant then DC. - Prescribe low dose Prozac. - Advise clonazepam as needed for panic attacks. - Schedule follow-up in one month to assess response.  Vitamin B12 Deficiency Missed B12 injection last week. Plan to assess B12 levels post-injection series. - Administer B12 injection today. - Check B12 levels at next control visit to determine future supplementation needs.   Follow up one month or sooner if needed.

## 2023-09-18 NOTE — Progress Notes (Signed)
 Subjective:    Patient ID: Tracy Rich, male    DOB: 1996-01-09, 28 y.o.   MRN: [id number]  HPI  Discussed the use of AI scribe software for clinical note transcription with the patient, who gave verbal consent to proceed.  History of Present Illness   Tracy Rich is a 28 year old male who presents with nasal congestion and sinus pain.  He has been experiencing nasal congestion, cough, and rhinorrhea for the past four days. The nasal discharge is described as 'neon green', and he notes sinus pain, particularly behind his eyes and in his cheeks, which he associates with his maxillary sinuses. He feels a heartbeat sensation in his face, indicating significant pressure. He has been using Tylenol Sinus and Congestion for relief but has not taken any today.  He reports an occasional cough that was more prominent during the first two days of his illness but has since subsided. No current productive cough. He experiences body aches, particularly in the mornings, which have been present every day since the onset of his symptoms. He wonders if he has developed a new allergy, as he has been sneezing more frequently over the past four days, although he has no history of allergies.  He typically experiences sinus infections following colds and has previously been treated with azithromycin, which he reports is effective. He has used Flonase in the past and has purchased Claritin to see if it helps with his symptoms.  He missed his B12 shot last week and is currently on a monthly B12 injection regimen, with next month being his last scheduled injection. He has previously taken oral B12 supplements at a dose of 10,000 micrograms every other day after stopping injections.  He has a history of anxiety and has previously been on Lexapro, which he discontinued due to side effects such as increased anxiety, depression, insomnia, and cold sweats. He has also tried Buspar in the past but did not work. He  reports a constant feeling of anxiety but denies current depression. Still using clonazaepam  He is exposed to a lot of smoke, both from his own smoking and from his environment.             Review of Systems  HENT:  Positive for congestion, sinus pressure and sinus pain. Negative for ear pain.   Respiratory:  Positive for cough. Negative for chest tightness, shortness of breath and wheezing.   Cardiovascular:  Negative for chest pain and palpitations.  Gastrointestinal:  Negative for abdominal pain and constipation.  Genitourinary:  Negative for dysuria, flank pain, frequency and penile pain.  Neurological:  Negative for syncope, facial asymmetry and numbness.  Hematological:  Negative for adenopathy.  Psychiatric/Behavioral:  The patient is not nervous/anxious.     Past Medical History:  Diagnosis Date   Anxiety    Hydradenitis 01/03/2021   Patient states history of hidradenitis suppurativa     Social History   Socioeconomic History   Marital status: Single    Spouse name: Not on file   Number of children: Not on file   Years of education: Not on file   Highest education level: Not on file  Occupational History   Not on file  Tobacco Use   Smoking status: Former    Types: Cigarettes   Smokeless tobacco: Never  Vaping Use   Vaping status: Never Used  Substance and Sexual Activity   Alcohol use: No   Drug use: No   Sexual activity: Not on  file  Other Topics Concern   Not on file  Social History Narrative   Caffeine- once every other day   Social Drivers of Health   Financial Resource Strain: Not on file  Food Insecurity: Not on file  Transportation Needs: Not on file  Physical Activity: Not on file  Stress: Not on file  Social Connections: Unknown (11/15/2021)   Received from [facility], [facility]   Social Network    Social Network: Not on file  Intimate Partner Violence: Unknown (10/07/2021)   Received from [facility], [facility]   HITS     Physically Hurt: Not on file    Insult or Talk Down To: Not on file    Threaten Physical Harm: Not on file    Scream or Curse: Not on file    Past Surgical History:  Procedure Laterality Date   CHOLECYSTECTOMY N/A 02/12/2023   Procedure: LAPAROSCOPIC CHOLECYSTECTOMY;  Surgeon: Berna Bue, MD;  Location: [facility] OR;  Service: General;  Laterality: N/A;   INTRAOPERATIVE CHOLANGIOGRAM N/A 02/12/2023   Procedure: INTRAOPERATIVE CHOLANGIOGRAM;  Surgeon: Berna Bue, MD;  Location: [facility] OR;  Service: General;  Laterality: N/A;   TONSILLECTOMY     WISDOM TOOTH EXTRACTION      Family History  Problem Relation Age of Onset   Thyroid disease Mother    Cancer Mother    Hypertension Mother    Thyroid cancer Mother    GER disease Mother    Healthy Father    GER disease Father    Healthy Sister    Esophageal cancer Neg Hx    Colon cancer Neg Hx     No Known Allergies  Current Outpatient Medications on File Prior to Visit  Medication Sig Dispense Refill   amphetamine-dextroamphetamine (ADDERALL) 20 MG tablet Take 1 tablet (20 mg total) by mouth 2 (two) times daily. 60 tablet 0   clonazePAM (KLONOPIN) 1 MG tablet Take 1 tablet (1 mg total) by mouth 2 (two) times daily as needed for anxiety. 60 tablet 1   doxycycline (VIBRA-TABS) 100 MG tablet Take 1 tablet (100 mg total) by mouth 2 (two) times daily. 14 tablet 0   famotidine (PEPCID) 20 MG tablet Take 1 tablet (20 mg total) by mouth 2 (two) times daily. 60 tablet 2   hydrOXYzine (ATARAX) 10 MG tablet Take 1-2 tablets (10-20 mg total) by mouth at bedtime as needed for itching 20 tablet 0   metFORMIN (GLUCOPHAGE) 500 MG tablet Take 1 tablet (500 mg total) by mouth daily with breakfast. 30 tablet 0   methylPREDNISolone (MEDROL DOSEPAK) 4 MG TBPK tablet Take by mouth as directed see package for directions 21 each 0   tiZANidine (ZANAFLEX) 4 MG tablet Take 1 tablet (4 mg total) by mouth at bedtime. 5 tablet 0   Vitamin D, Ergocalciferol,  (DRISDOL) 1.25 MG (50000 UNIT) CAPS capsule Take 1 capsule (50,000 Units total) by mouth every 7 (seven) days. 8 capsule 0   [DISCONTINUED] metoprolol succinate (TOPROL-XL) 25 MG 24 hr tablet Take 1 tablet (25 mg total) by mouth daily. (Patient not taking: Reported on 07/11/2023) 30 tablet 3   [DISCONTINUED] sertraline (ZOLOFT) 25 MG tablet Take 1 tablet (25 mg total) by mouth daily. (Patient not taking: Reported on 09/15/2020) 30 tablet 0   No current facility-administered medications on file prior to visit.    BP 130/85   Pulse 72   Resp 18   Ht 6\' 1"  (1.854 m)   Wt 266 lb (120.7  kg)   SpO2 99%   BMI 35.09 kg/m         Objective:   Physical Exam   General Mental Status- Alert. General Appearance- Not in acute distress.    Neck  No JVD.  Chest and Lung Exam Auscultation: Breath Sounds:-CTA  Cardiovascular Auscultation:Rythm- RRR Murmurs & Other Heart Sounds:Auscultation of the heart reveals- No Murmurs.  Abdomen Inspection:-Inspeection Normal. Palpation/Percussion:Note:No mass. Palpation and Percussion of the abdomen reveal- Non Tender, Non Distended + BS, no rebound or guarding.   Neurologic Cranial Nerve exam:- CN III-XII intact(No nystagmus), symmetric smile. Strength:- 5/5 equal and symmetric strength both upper and lower extremities.    Heent- maxillary sinus pressure. No frontal sinus pressure.       Assessment & Plan:   Assessment and Plan    Acute Sinusitis Symptoms consistent with acute sinusitis. Azithromycin considered effective based on past outcomes. - Prescribe azithromycin. - Prescribe Flonase nasal spray. - Advise Claritin in seven days if symptoms persist.  Generalized Anxiety Disorder Chronic anxiety with previous Lexapro side effects. Discussed Prozac as an alternative SSRI with potential side effects. - Prescribe low dose Prozac. - Advise clonazepam as needed for panic attacks. - Schedule follow-up in one month to assess  response.  Vitamin B12 Deficiency Missed B12 injection last week. Plan to assess B12 levels post-injection series. - Administer B12 injection today. - Check B12 levels at next control visit to determine future supplementation needs.   Follow up one month or sooner if needed.        [name], PA-C

## 2023-10-08 ENCOUNTER — Other Ambulatory Visit: Payer: Self-pay | Admitting: Medical

## 2023-10-08 ENCOUNTER — Other Ambulatory Visit (HOSPITAL_BASED_OUTPATIENT_CLINIC_OR_DEPARTMENT_OTHER): Payer: Self-pay

## 2023-10-08 MED ORDER — AMPHETAMINE-DEXTROAMPHETAMINE 20 MG PO TABS
20.0000 mg | ORAL_TABLET | Freq: Two times a day (BID) | ORAL | 0 refills | Status: DC
  Filled 2023-10-08: qty 60, 30d supply, fill #0

## 2023-10-08 NOTE — Telephone Encounter (Signed)
 Rx refill adderal sent to pt pharmacy. Please schedule pt for nurse visit to give uds and update contract by end of this month.

## 2023-10-08 NOTE — Telephone Encounter (Signed)
 Requesting: adderall Contract:11/08/22 UDS:10/24/22 Last Visit:09/18/23 Next Visit:n/a Last Refill:09/05/23  Please Advise

## 2023-10-09 ENCOUNTER — Other Ambulatory Visit (HOSPITAL_BASED_OUTPATIENT_CLINIC_OR_DEPARTMENT_OTHER): Payer: Self-pay

## 2023-10-09 ENCOUNTER — Other Ambulatory Visit: Payer: Self-pay

## 2023-10-09 DIAGNOSIS — F419 Anxiety disorder, unspecified: Secondary | ICD-10-CM

## 2023-10-13 DIAGNOSIS — Z419 Encounter for procedure for purposes other than remedying health state, unspecified: Secondary | ICD-10-CM | POA: Diagnosis not present

## 2023-10-23 ENCOUNTER — Other Ambulatory Visit: Payer: Self-pay

## 2023-10-23 DIAGNOSIS — E538 Deficiency of other specified B group vitamins: Secondary | ICD-10-CM

## 2023-10-24 ENCOUNTER — Other Ambulatory Visit (INDEPENDENT_AMBULATORY_CARE_PROVIDER_SITE_OTHER)

## 2023-10-24 ENCOUNTER — Ambulatory Visit (INDEPENDENT_AMBULATORY_CARE_PROVIDER_SITE_OTHER)

## 2023-10-24 ENCOUNTER — Ambulatory Visit: Admitting: Medical

## 2023-10-24 ENCOUNTER — Encounter: Payer: Self-pay | Admitting: Medical

## 2023-10-24 DIAGNOSIS — F419 Anxiety disorder, unspecified: Secondary | ICD-10-CM

## 2023-10-24 DIAGNOSIS — E538 Deficiency of other specified B group vitamins: Secondary | ICD-10-CM

## 2023-10-24 LAB — VITAMIN B12: Vitamin B-12: 597 pg/mL (ref 211–911)

## 2023-10-24 MED ORDER — CYANOCOBALAMIN 1000 MCG/ML IJ SOLN
1000.0000 ug | Freq: Once | INTRAMUSCULAR | Status: AC
Start: 2023-10-24 — End: 2023-10-24
  Administered 2023-10-24: 1000 ug via INTRAMUSCULAR

## 2023-10-24 NOTE — Progress Notes (Signed)
 Pt here for monthly B12 injection per Edward Saguier,PA-C  B12 1000mcg given IM, and pt tolerated injection well.  Next B12 injection scheduled for to be determined after b12 labs

## 2023-10-26 LAB — DRUG MONITORING PANEL 376104, URINE
Amphetamines: NEGATIVE ng/mL (ref <500)
Barbiturates: NEGATIVE ng/mL (ref <300)
Benzodiazepines: NEGATIVE ng/mL (ref <100)
Cocaine Metabolite: NEGATIVE ng/mL (ref <150)
Desmethyltramadol: NEGATIVE ng/mL (ref <100)
Opiates: NEGATIVE ng/mL (ref <100)
Oxycodone: NEGATIVE ng/mL (ref <100)
Tramadol: NEGATIVE ng/mL (ref <100)

## 2023-10-26 LAB — DM TEMPLATE

## 2023-10-31 ENCOUNTER — Ambulatory Visit (INDEPENDENT_AMBULATORY_CARE_PROVIDER_SITE_OTHER): Admitting: Medical

## 2023-10-31 ENCOUNTER — Other Ambulatory Visit (HOSPITAL_BASED_OUTPATIENT_CLINIC_OR_DEPARTMENT_OTHER): Payer: Self-pay

## 2023-10-31 VITALS — BP 114/86 | HR 68 | Temp 98.3°F | Resp 18 | Ht 73.0 in | Wt 259.0 lb

## 2023-10-31 DIAGNOSIS — J3489 Other specified disorders of nose and nasal sinuses: Secondary | ICD-10-CM | POA: Diagnosis not present

## 2023-10-31 DIAGNOSIS — J40 Bronchitis, not specified as acute or chronic: Secondary | ICD-10-CM | POA: Diagnosis not present

## 2023-10-31 DIAGNOSIS — J019 Acute sinusitis, unspecified: Secondary | ICD-10-CM

## 2023-10-31 DIAGNOSIS — R059 Cough, unspecified: Secondary | ICD-10-CM | POA: Diagnosis not present

## 2023-10-31 DIAGNOSIS — R067 Sneezing: Secondary | ICD-10-CM

## 2023-10-31 MED ORDER — AZITHROMYCIN 250 MG PO TABS
ORAL_TABLET | ORAL | 0 refills | Status: AC
  Filled 2023-10-31: qty 6, 5d supply, fill #0

## 2023-10-31 MED ORDER — FLUTICASONE PROPIONATE 50 MCG/ACT NA SUSP
2.0000 | Freq: Every day | NASAL | 1 refills | Status: AC
  Filled 2023-10-31: qty 16, 30d supply, fill #0

## 2023-10-31 MED ORDER — ALBUTEROL SULFATE HFA 108 (90 BASE) MCG/ACT IN AERS
2.0000 | INHALATION_SPRAY | Freq: Four times a day (QID) | RESPIRATORY_TRACT | 0 refills | Status: DC | PRN
  Filled 2023-10-31: qty 18, 30d supply, fill #0

## 2023-10-31 MED ORDER — BENZONATATE 100 MG PO CAPS
100.0000 mg | ORAL_CAPSULE | Freq: Three times a day (TID) | ORAL | 0 refills | Status: DC | PRN
  Filled 2023-10-31: qty 30, 10d supply, fill #0

## 2023-10-31 NOTE — Progress Notes (Signed)
 Subjective:    Patient ID: Tracy Rich, male    DOB: Apr 25, 1996, 28 y.o.   MRN: [id number]  HPI Tracy Rich is a 28 year old male who presents with sinus congestion and chest discomfort.  He has been experiencing nasal congestion and chest discomfort since Sunday. Initially, he thought it was a minor chest cold, but it has progressed to involve his sinuses and lungs, similar to a previous episode of bronchitis in November-December.  He describes difficulty breathing, particularly feeling unable to take a full breath, and notes occasional wheezing that typically resolves after coughing. He also reports left-sided chest pain, particularly in the lower rib area, which occurs when exhaling.  He mentions itching in his ears and sneezing but denies a history of allergies. He suspects his symptoms may be related to swimming in the ocean last Saturday, as he became sick the following day. He recalls a similar experience of getting sick after ocean swimming in the past.  He has a history of sinus infections and bronchitis, with a previous treatment involving azithromycin , which was effective by the third day. He has not been using his Flonase  recently, although he still has it available.  He quit smoking three weeks ago, which is relevant to his respiratory symptoms. He is not currently using any inhalers but recalls being prescribed albuterol  in the past.    Review of Systems  Constitutional:  Negative for chills, fatigue and fever.  HENT:  Positive for congestion. Negative for ear pain, hearing loss, postnasal drip, sinus pressure and sore throat.   Respiratory:  Positive for cough and wheezing. Negative for chest tightness and shortness of breath.   Cardiovascular:  Negative for chest pain and palpitations.  Gastrointestinal:  Negative for abdominal pain, blood in stool and diarrhea.  Musculoskeletal:  Negative for back pain and joint swelling.  Neurological:  Negative for dizziness and  seizures.  Hematological:  Negative for adenopathy.  Psychiatric/Behavioral:  Negative for behavioral problems and decreased concentration.    Past Medical History:  Diagnosis Date   Anxiety    Hydradenitis 01/03/2021   Patient states history of hidradenitis suppurativa     Social History   Socioeconomic History   Marital status: Single    Spouse name: Not on file   Number of children: Not on file   Years of education: Not on file   Highest education level: Not on file  Occupational History   Not on file  Tobacco Use   Smoking status: Former    Types: Cigarettes   Smokeless tobacco: Never  Vaping Use   Vaping status: Never Used  Substance and Sexual Activity   Alcohol use: No   Drug use: No   Sexual activity: Not on file  Other Topics Concern   Not on file  Social History Narrative   Caffeine- once every other day   Social Drivers of Health   Financial Resource Strain: Not on file  Food Insecurity: Not on file  Transportation Needs: Not on file  Physical Activity: Not on file  Stress: Not on file  Social Connections: Unknown (11/15/2021)   Received from [facility], [facility]   Social Network    Social Network: Not on file  Intimate Partner Violence: Unknown (10/07/2021)   Received from [facility], [facility]   HITS    Physically Hurt: Not on file    Insult or Talk Down To: Not on file    Threaten Physical Harm: Not on file  Scream or Curse: Not on file    Past Surgical History:  Procedure Laterality Date   CHOLECYSTECTOMY N/A 02/12/2023   Procedure: LAPAROSCOPIC CHOLECYSTECTOMY;  Surgeon: Adalberto Acton, MD;  Location: [facility] OR;  Service: General;  Laterality: N/A;   INTRAOPERATIVE CHOLANGIOGRAM N/A 02/12/2023   Procedure: INTRAOPERATIVE CHOLANGIOGRAM;  Surgeon: Adalberto Acton, MD;  Location: [facility] OR;  Service: General;  Laterality: N/A;   TONSILLECTOMY     WISDOM TOOTH EXTRACTION      Family History  Problem Relation Age of Onset   Thyroid   disease Mother    Cancer Mother    Hypertension Mother    Thyroid  cancer Mother    GER disease Mother    Healthy Father    GER disease Father    Healthy Sister    Esophageal cancer Neg Hx    Colon cancer Neg Hx     No Known Allergies  Current Outpatient Medications on File Prior to Visit  Medication Sig Dispense Refill   amphetamine -dextroamphetamine  (ADDERALL) 20 MG tablet Take 1 tablet (20 mg total) by mouth 2 (two) times daily. 60 tablet 0   clonazePAM  (KLONOPIN ) 1 MG tablet Take 1 tablet (1 mg total) by mouth 2 (two) times daily as needed for anxiety. 60 tablet 1   doxycycline  (VIBRA -TABS) 100 MG tablet Take 1 tablet (100 mg total) by mouth 2 (two) times daily. 14 tablet 0   famotidine  (PEPCID ) 20 MG tablet Take 1 tablet (20 mg total) by mouth 2 (two) times daily. 60 tablet 2   FLUoxetine  (PROZAC ) 10 MG capsule Take 1 capsule (10 mg total) by mouth daily. 30 capsule 0   fluticasone  (FLONASE ) 50 MCG/ACT nasal spray Place 2 sprays into both nostrils daily. 16 g 1   hydrOXYzine  (ATARAX ) 10 MG tablet Take 1-2 tablets (10-20 mg total) by mouth at bedtime as needed for itching 20 tablet 0   metFORMIN  (GLUCOPHAGE ) 500 MG tablet Take 1 tablet (500 mg total) by mouth daily with breakfast. 30 tablet 0   methylPREDNISolone  (MEDROL  DOSEPAK) 4 MG TBPK tablet Take by mouth as directed see package for directions 21 each 0   tiZANidine  (ZANAFLEX ) 4 MG tablet Take 1 tablet (4 mg total) by mouth at bedtime. 5 tablet 0   Vitamin D , Ergocalciferol , (DRISDOL ) 1.25 MG (50000 UNIT) CAPS capsule Take 1 capsule (50,000 Units total) by mouth every 7 (seven) days. 8 capsule 0   [DISCONTINUED] metoprolol  succinate (TOPROL -XL) 25 MG 24 hr tablet Take 1 tablet (25 mg total) by mouth daily. (Patient not taking: Reported on 07/11/2023) 30 tablet 3   [DISCONTINUED] sertraline  (ZOLOFT ) 25 MG tablet Take 1 tablet (25 mg total) by mouth daily. (Patient not taking: Reported on 09/15/2020) 30 tablet 0   No current  facility-administered medications on file prior to visit.    BP 114/86   Pulse 68   Temp 98.3 F (36.8 C)   Resp 18   Ht 6\' 1"  (1.854 m)   Wt 259 lb (117.5 kg)   SpO2 97%   BMI 34.17 kg/m         Objective:   Physical Exam  General Mental Status- Alert. General Appearance- Not in acute distress.   Skin General: Color- Normal Color. Moisture- Normal Moisture.  Neck  No JVD.  Chest and Lung Exam Auscultation: Breath Sounds:-CTA  Cardiovascular Auscultation:Rythm- RRR Murmurs & Other Heart Sounds:Auscultation of the heart reveals- No Murmurs.  Abdomen Inspection:-Inspeection Normal. Palpation/Percussion:Note:No mass. Palpation and Percussion of the abdomen reveal- Non Tender,  Non Distended + BS, no rebound or guarding.   Neurologic Cranial Nerve exam:- CN III-XII intact(No nystagmus), symmetric smile. Strength:- 5/5 equal and symmetric strength both upper and lower extremities.       Assessment & Plan:   Patient Instructions  Acute bronchitis Acute bronchitis with wheezing and chest congestion. Possible allergy component. Recent smoking cessation noted. No chest x-ray unless symptoms worsen. - Prescribe albuterol  inhaler for wheezing. - Prescribe azithromycin  (Z-Pak) for bronchitis. -benzonatate  for cough.  - Advise monitoring symptoms and obtain chest x-ray if congestion persists or rib pain worsens.  Acute sinusitis Acute sinusitis with nasal congestion and sneezing post-cold. Recurrent infections noted. Flonase  recommended for possible allergy component. - Prescribe azithromycin  (Z-Pak) for sinusitis. - Recommend Flonase  for nasal congestion.  Allergic rhinitis Possible allergic rhinitis due to seasonal pollen exposure. Symptoms include sneezing and itchy ears. - Recommend Flonase  for nasal symptoms.  Follow up 7-10 days or sooner if needed   [employer], PA-C

## 2023-10-31 NOTE — Patient Instructions (Signed)
 Acute bronchitis Acute bronchitis with wheezing and chest congestion. Possible allergy component. Recent smoking cessation noted. No chest x-ray unless symptoms worsen. - Prescribe albuterol  inhaler for wheezing. - Prescribe azithromycin  (Z-Pak) for bronchitis. -benzonatate  for cough.  - Advise monitoring symptoms and obtain chest x-ray if congestion persists or rib pain worsens.  Acute sinusitis Acute sinusitis with nasal congestion and sneezing post-cold. Recurrent infections noted. Flonase  recommended for possible allergy component. - Prescribe azithromycin  (Z-Pak) for sinusitis. - Recommend Flonase  for nasal congestion.  Allergic rhinitis Possible allergic rhinitis due to seasonal pollen exposure. Symptoms include sneezing and itchy ears. - Recommend Flonase  for nasal symptoms.  Follow up 7-10 days or sooner if needed

## 2023-11-02 ENCOUNTER — Ambulatory Visit (HOSPITAL_BASED_OUTPATIENT_CLINIC_OR_DEPARTMENT_OTHER)
Admission: RE | Admit: 2023-11-02 | Discharge: 2023-11-02 | Disposition: A | Discharge status: Home / Self Care | Source: Ambulatory Visit | Attending: Medical | Admitting: Medical

## 2023-11-02 ENCOUNTER — Encounter: Payer: Self-pay | Admitting: Medical

## 2023-11-02 DIAGNOSIS — R059 Cough, unspecified: Secondary | ICD-10-CM | POA: Insufficient documentation

## 2023-11-02 DIAGNOSIS — J40 Bronchitis, not specified as acute or chronic: Secondary | ICD-10-CM | POA: Insufficient documentation

## 2023-11-07 ENCOUNTER — Other Ambulatory Visit (HOSPITAL_BASED_OUTPATIENT_CLINIC_OR_DEPARTMENT_OTHER): Payer: Self-pay

## 2023-11-07 ENCOUNTER — Other Ambulatory Visit: Payer: Self-pay | Admitting: Medical

## 2023-11-07 NOTE — Telephone Encounter (Signed)
 Requesting: adderall & klonopin   Contract:11/02/23 UDS:11/02/23 Last Visit:10/31/23 Next Visit:n/a Last Refill:10/08/23  Please Advise

## 2023-11-08 ENCOUNTER — Encounter: Payer: Self-pay | Admitting: Medical

## 2023-11-08 ENCOUNTER — Other Ambulatory Visit: Payer: Self-pay

## 2023-11-08 MED ORDER — CLONAZEPAM 1 MG PO TABS
1.0000 mg | ORAL_TABLET | Freq: Two times a day (BID) | ORAL | 1 refills | Status: DC | PRN
  Filled 2023-11-08: qty 60, 30d supply, fill #0
  Filled 2023-12-12 (×2): qty 60, 30d supply, fill #1

## 2023-11-08 MED ORDER — AMPHETAMINE-DEXTROAMPHETAMINE 20 MG PO TABS
20.0000 mg | ORAL_TABLET | Freq: Two times a day (BID) | ORAL | 0 refills | Status: DC
  Filled 2023-11-08: qty 60, 30d supply, fill #0

## 2023-11-08 NOTE — Telephone Encounter (Signed)
 Rx refill med sent. Have pt schedule controlled med visit with me in September.

## 2023-11-08 NOTE — Telephone Encounter (Signed)
 PCP notified of refill 11/07/23

## 2023-11-08 NOTE — Telephone Encounter (Signed)
 Pt sent another request via mychart for medication

## 2023-11-12 DIAGNOSIS — Z419 Encounter for procedure for purposes other than remedying health state, unspecified: Secondary | ICD-10-CM | POA: Diagnosis not present

## 2023-11-23 ENCOUNTER — Encounter (HOSPITAL_BASED_OUTPATIENT_CLINIC_OR_DEPARTMENT_OTHER): Payer: Self-pay

## 2023-11-23 ENCOUNTER — Other Ambulatory Visit: Payer: Self-pay

## 2023-11-23 ENCOUNTER — Other Ambulatory Visit (HOSPITAL_BASED_OUTPATIENT_CLINIC_OR_DEPARTMENT_OTHER): Payer: Self-pay

## 2023-11-23 ENCOUNTER — Emergency Department (HOSPITAL_BASED_OUTPATIENT_CLINIC_OR_DEPARTMENT_OTHER)
Admission: EM | Admit: 2023-11-23 | Discharge: 2023-11-23 | Disposition: A | Discharge status: Home / Self Care | Attending: Emergency Medicine | Admitting: Emergency Medicine

## 2023-11-23 DIAGNOSIS — Z79899 Other long term (current) drug therapy: Secondary | ICD-10-CM | POA: Insufficient documentation

## 2023-11-23 DIAGNOSIS — Z7984 Long term (current) use of oral hypoglycemic drugs: Secondary | ICD-10-CM | POA: Diagnosis not present

## 2023-11-23 DIAGNOSIS — L02416 Cutaneous abscess of left lower limb: Secondary | ICD-10-CM | POA: Insufficient documentation

## 2023-11-23 DIAGNOSIS — L0291 Cutaneous abscess, unspecified: Secondary | ICD-10-CM

## 2023-11-23 MED ORDER — KETOROLAC TROMETHAMINE 60 MG/2ML IM SOLN
60.0000 mg | Freq: Once | INTRAMUSCULAR | Status: AC
  Administered 2023-11-23: 60 mg via INTRAMUSCULAR
  Filled 2023-11-23: qty 2

## 2023-11-23 MED ORDER — DOXYCYCLINE HYCLATE 100 MG PO CAPS
100.0000 mg | ORAL_CAPSULE | Freq: Two times a day (BID) | ORAL | 0 refills | Status: DC
  Filled 2023-11-23: qty 20, 10d supply, fill #0

## 2023-11-23 NOTE — ED Triage Notes (Addendum)
 Pt reports red, swollen, hard area to left inner thigh x 3 days.

## 2023-11-23 NOTE — ED Provider Notes (Signed)
 [facility] Provider Note   CSN: [id number] Arrival date & time: 11/23/23  8469     History  Chief Complaint  Patient presents with   Abscess    Tracy Rich is a 28 y.o. male.  28 year old male presenting with abscess of his left thigh.  Symptoms have been ongoing for 3 days, patient denies fevers/chills.  Patient has presumed history of hidradenitis suppurativa, reports he tends to get abscesses in the area of his inner thighs, no other abscesses at this time.  Patient has not had recurrence of this issue in a little over a year, has not used oral antibiotics/topical clindamycin  in the past.  Reports discomfort to his left inner thigh, no active drainage.   Abscess      Home Medications Prior to Admission medications   Medication Sig Start Date End Date Taking? Authorizing Provider  albuterol  (VENTOLIN  HFA) 108 (90 Base) MCG/ACT inhaler Inhale 2 puffs into the lungs every 6 (six) hours as needed. 10/31/23   Saguier, Gaylin Ke, PA-C  amphetamine -dextroamphetamine  (ADDERALL) 20 MG tablet Take 1 tablet (20 mg total) by mouth 2 (two) times daily. 11/08/23   Saguier, Gaylin Ke, PA-C  benzonatate  (TESSALON ) 100 MG capsule Take 1 capsule (100 mg total) by mouth 3 (three) times daily as needed for cough. 10/31/23   Saguier, Gaylin Ke, PA-C  clonazePAM  (KLONOPIN ) 1 MG tablet Take 1 tablet (1 mg total) by mouth 2 (two) times daily as needed for anxiety. 11/08/23   Saguier, Gaylin Ke, PA-C  doxycycline  (VIBRA -TABS) 100 MG tablet Take 1 tablet (100 mg total) by mouth 2 (two) times daily. 07/11/23   Saguier, Gaylin Ke, PA-C  famotidine  (PEPCID ) 20 MG tablet Take 1 tablet (20 mg total) by mouth 2 (two) times daily. 07/05/23   Saguier, Gaylin Ke, PA-C  FLUoxetine  (PROZAC ) 10 MG capsule Take 1 capsule (10 mg total) by mouth daily. 09/18/23   Saguier, Gaylin Ke, PA-C  fluticasone  (FLONASE ) 50 MCG/ACT nasal spray Place 2 sprays into both nostrils daily. 09/18/23   Saguier, Gaylin Ke,  PA-C  fluticasone  (FLONASE ) 50 MCG/ACT nasal spray Place 2 sprays into both nostrils daily. 10/31/23   Saguier, Gaylin Ke, PA-C  hydrOXYzine  (ATARAX ) 10 MG tablet Take 1-2 tablets (10-20 mg total) by mouth at bedtime as needed for itching 07/11/23   Saguier, Gaylin Ke, PA-C  metFORMIN  (GLUCOPHAGE ) 500 MG tablet Take 1 tablet (500 mg total) by mouth daily with breakfast. 08/03/23   Saguier, Gaylin Ke, PA-C  methylPREDNISolone  (MEDROL  DOSEPAK) 4 MG TBPK tablet Take by mouth as directed see package for directions 07/27/23   Saguier, Gaylin Ke, PA-C  tiZANidine  (ZANAFLEX ) 4 MG tablet Take 1 tablet (4 mg total) by mouth at bedtime. 07/27/23   Saguier, Gaylin Ke, PA-C  Vitamin D , Ergocalciferol , (DRISDOL ) 1.25 MG (50000 UNIT) CAPS capsule Take 1 capsule (50,000 Units total) by mouth every 7 (seven) days. 04/23/23   Saguier, Gaylin Ke, PA-C  metoprolol  succinate (TOPROL -XL) 25 MG 24 hr tablet Take 1 tablet (25 mg total) by mouth daily. Patient not taking: Reported on 07/11/2023 09/15/20 09/23/20  Saguier, Gaylin Ke, PA-C  sertraline  (ZOLOFT ) 25 MG tablet Take 1 tablet (25 mg total) by mouth daily. Patient not taking: Reported on 09/15/2020 06/15/20 09/23/20  Saguier, Gaylin Ke, PA-C      Allergies    Patient has no known allergies.    Review of Systems   Review of Systems  Physical Exam Updated Vital Signs BP (!) 137/93   Pulse 82   Temp (!) 97 F (36.1 C) (Oral)  Resp 16   Wt 117.9 kg   SpO2 100%   BMI 34.30 kg/m  Physical Exam Vitals and nursing note reviewed.  HENT:     Head: Normocephalic.  Eyes:     Extraocular Movements: Extraocular movements intact.  Cardiovascular:     Rate and Rhythm: Normal rate.  Pulmonary:     Effort: Pulmonary effort is normal.  Musculoskeletal:     Cervical back: Normal range of motion.     Comments: Moves all extremities spontaneously without difficulty  Skin:    General: Skin is warm and dry.     Comments: 2 to 3 cm area of induration of the left inner thigh with large surrounding  area of cellulitis, see photo.  Multiple scars in same region, likely consistent with prior abscesses.  Neurological:     Mental Status: He is alert and oriented to person, place, and time.     ED Results / Procedures / Treatments   Labs (all labs ordered are listed, but only abnormal results are displayed) Labs Reviewed - No data to display  EKG None  Radiology No results found.  Procedures Procedures    Medications Ordered in ED Medications  ketorolac  (TORADOL ) injection 60 mg (60 mg Intramuscular Given 11/23/23 0956)    ED Course/ Medical Decision Making/ A&P                                 Medical Decision Making This patient presents to the ED for concern of abscess, this involves an extensive number of treatment options, and is a complaint that carries with it a high risk of complications and morbidity.  The differential diagnosis includes abscess, cellulitis, erysipelas, lymphangitis, complication of hidradenitis suppurativa.   Co morbidities that complicate the patient evaluation  History of presumed hidradenitis suppurativa   Additional history obtained:  Additional history obtained from record review External records from outside source obtained and reviewed including PCP notes   Cardiac Monitoring: / EKG:  The patient was maintained on a cardiac monitor.  I personally viewed and interpreted the cardiac monitored which showed an underlying rhythm of: Normal sinus rhythm   Problem List / ED Course / Critical interventions / Medication management   I ordered medication including Toradol  for pain Reevaluation of the patient after these medicines showed that the patient stayed the same I have reviewed the patients home medicines and have made adjustments as needed   Social Determinants of Health:  Former tobacco use, depression   Test / Admission - Considered:  Physical exam notable as above, see photos.  There is a 2 to 3 cm area of induration,  however no obvious area of fluctuance or "head" of abscess.  I spoke in depth with patient, given his presumed history of hidradenitis suppurativa I educated the patient that it is often not appropriate to I&D abscesses with this history, to avoid worsening scarring.   Given that there is no obvious fluctuance/"head", I do not feel it would be appropriate to incise and drain this abscess at this time.  Patient is afebrile, no tachycardia, no history of immunosuppression, I elected to not proceed with blood work at this time given patient's reassuring appearance/vitals.  Toradol  given for pain, advised patient starting tomorrow he can continue 400 mg ibuprofen  every 4-6 hours as needed for pain, warm compresses as needed.  Will have patient start 100 mg doxycycline  twice daily for 10 days, recommend that  he follow-up with his primary care provider next week.  Strict return precautions discussed, advised patient to return to the emergency department if symptoms worsen or if he develops a fever/chills.  Patient voiced understanding and is agreeable with this plan, he is appropriate for discharge at this time.  Discussed case with Dr. Zackowski.    Risk Prescription drug management.           Final Clinical Impression(s) / ED Diagnoses Final diagnoses:  Abscess    Rx / DC Orders ED Discharge Orders          Ordered    doxycycline  (VIBRAMYCIN ) 100 MG capsule  2 times daily        11/23/23 0946              Kendrick Pax, PA-C 11/23/23 1008    Nicklas Barns, MD 11/23/23 [phone]

## 2023-11-23 NOTE — Discharge Instructions (Addendum)
 Return to the emergency department if your symptoms worsen, or if you develop a fever/chills.  Follow-up with your primary care provider next week.  Start 100 mg doxycycline , 1 tablet twice daily for 10 days.  Continue 400 mg ibuprofen  every 4-6 hours as needed for pain starting tomorrow. Continue warm compresses to the affected area.

## 2023-12-12 ENCOUNTER — Other Ambulatory Visit (HOSPITAL_BASED_OUTPATIENT_CLINIC_OR_DEPARTMENT_OTHER): Payer: Self-pay

## 2023-12-12 ENCOUNTER — Other Ambulatory Visit: Payer: Self-pay | Admitting: Medical

## 2023-12-12 NOTE — Telephone Encounter (Signed)
 Requesting: Adderall 20mg   Contract:10/24/23 UDS: 10/24/23 Last Visit: 10/31/23 Next Visit: None Last Refill: 11/08/23 #60 and 0RF   Please Advise

## 2023-12-13 ENCOUNTER — Other Ambulatory Visit (HOSPITAL_BASED_OUTPATIENT_CLINIC_OR_DEPARTMENT_OTHER): Payer: Self-pay

## 2023-12-13 DIAGNOSIS — Z419 Encounter for procedure for purposes other than remedying health state, unspecified: Secondary | ICD-10-CM | POA: Diagnosis not present

## 2023-12-14 ENCOUNTER — Other Ambulatory Visit: Payer: Self-pay

## 2023-12-14 ENCOUNTER — Other Ambulatory Visit (HOSPITAL_BASED_OUTPATIENT_CLINIC_OR_DEPARTMENT_OTHER): Payer: Self-pay

## 2023-12-14 MED ORDER — AMPHETAMINE-DEXTROAMPHETAMINE 20 MG PO TABS
20.0000 mg | ORAL_TABLET | Freq: Two times a day (BID) | ORAL | 0 refills | Status: DC
  Filled 2023-12-14 (×2): qty 60, 30d supply, fill #0

## 2023-12-14 NOTE — Telephone Encounter (Signed)
 Rx refill sent to pharmacy.

## 2024-01-07 ENCOUNTER — Encounter: Payer: Self-pay | Admitting: Medical

## 2024-01-07 ENCOUNTER — Other Ambulatory Visit (HOSPITAL_BASED_OUTPATIENT_CLINIC_OR_DEPARTMENT_OTHER): Payer: Self-pay

## 2024-01-07 ENCOUNTER — Ambulatory Visit (INDEPENDENT_AMBULATORY_CARE_PROVIDER_SITE_OTHER): Admitting: Medical

## 2024-01-07 VITALS — BP 120/80 | HR 66 | Temp 98.3°F | Resp 17 | Ht 73.0 in | Wt 275.4 lb

## 2024-01-07 DIAGNOSIS — E669 Obesity, unspecified: Secondary | ICD-10-CM | POA: Diagnosis not present

## 2024-01-07 DIAGNOSIS — F17200 Nicotine dependence, unspecified, uncomplicated: Secondary | ICD-10-CM | POA: Diagnosis not present

## 2024-01-07 DIAGNOSIS — R5383 Other fatigue: Secondary | ICD-10-CM

## 2024-01-07 DIAGNOSIS — E538 Deficiency of other specified B group vitamins: Secondary | ICD-10-CM | POA: Diagnosis not present

## 2024-01-07 MED ORDER — FAMOTIDINE 20 MG PO TABS
20.0000 mg | ORAL_TABLET | Freq: Two times a day (BID) | ORAL | 2 refills | Status: DC
  Filled 2024-01-07: qty 60, 30d supply, fill #0

## 2024-01-07 MED ORDER — CLONAZEPAM 1 MG PO TABS
1.0000 mg | ORAL_TABLET | Freq: Two times a day (BID) | ORAL | 1 refills | Status: DC | PRN
  Filled 2024-01-07 – 2024-01-10 (×2): qty 60, 30d supply, fill #0
  Filled 2024-02-12: qty 60, 30d supply, fill #1

## 2024-01-07 MED ORDER — BUPROPION HCL ER (XL) 150 MG PO TB24
150.0000 mg | ORAL_TABLET | Freq: Every day | ORAL | 2 refills | Status: DC
  Filled 2024-01-07: qty 30, 30d supply, fill #0

## 2024-01-07 NOTE — Progress Notes (Signed)
   Subjective:    Patient ID: Tracy Rich, male    DOB: 1996/04/07, 28 y.o.   MRN: [id number]  HPI  The patient presents with fatigue and gastrointestinal symptoms related to metformin  use.  The patient experiences significant fatigue, which has been ongoing for an unspecified duration. He describes waking up feeling fatigued, with the sensation worsening throughout the day, impacting his ability to manage daily activities. He typically sleeps from 9 PM to 5:30 or 6 AM, totaling approximately eight hours of sleep per night. He is unsure if he snores but mentions waking himself up occasionally when congested. Previous B12 levels were low at 181 eight months ago but normalized to 597 two months ago without supplementation. He has not taken any B12 supplements since the last check.  in past had rx'd metformin  with hopes would help loose weight. He experiences gastrointestinal symptoms, specifically severe stomach cramping, associated with metformin  use. The cramps occur daily, lasting about one to one and a half hours, starting two to three hours after taking the medication. No diarrhea is reported, but the cramping is described as 'horrible.' Due to these symptoms, he has stopped taking metformin . Wants other med to use for weight loss. he declines injectable medication/glp-1 and mom relatives on her side may have thyroid  cancer.  Regarding medication use, he is due for a refill of Klonopin , which is used for anxiety management. He has recently completed the necessary controlled medication contract and drug screening. He also requests a refill of famotidine , which he 'desperately needs.'  In terms of family history, there is a history of thyroid  issues on his mother's side. Although his mother did not have thyroid  cancer, she underwent surgery for a thyroid  condition. This family history is relevant as it may influence medication choices for weight management.    Review of Systems See hpi     Objective:   Physical Exam  General Mental Status- Alert. General Appearance- Not in acute distress.   Skin General: Color- Normal Color. Moisture- Normal Moisture.  Neck No JVD.  Chest and Lung Exam Auscultation: Breath Sounds:-Normal.  Cardiovascular Auscultation:Rythm- Regular. Murmurs & Other Heart Sounds:Auscultation of the heart reveals- No Murmurs.  Abdomen Inspection:-Inspeection Normal. Palpation/Percussion:Note:No mass. Palpation and Percussion of the abdomen reveal- Non Tender, Non Distended + BS, no rebound or guarding.   Neurologic Cranial Nerve exam:- CN III-XII intact(No nystagmus), symmetric smile. Strength:- 5/5 equal and symmetric strength both upper and lower extremities.       Assessment & Plan:   Fatigue Chronic fatigue with previous low B12 levels. Adequate sleep reported. Potential sleep apnea considered if labs are normal. - Order B12 level. - Order CBC. - Order metabolic panel. - Order TSH. - Order T4. - Order B1. - Consider sleep study if labs are normal.  Anxiety Anxiety managed with clonazepam . Controlled medication contract and drug screening up to date. - Refill clonazepam .  Weight management and current smoker) just restarted. Severe stomach cramps with metformin . Family history of thyroid  cancer contraindicates GLP-1 receptor agonists. Wellbutrin  considered for weight management and mood improvement. - Prescribe Wellbutrin . - Discontinue metformin .  Gastroesophageal reflux disease (GERD) Famotidine  needed for symptom control. Acknowledged delayed onset of action. - Prescribe famotidine .  Follow up date to be determined after lab review

## 2024-01-07 NOTE — Patient Instructions (Signed)
 Fatigue Chronic fatigue with previous low B12 levels. Adequate sleep reported. Potential sleep apnea considered if labs are normal. - Order B12 level. - Order CBC. - Order metabolic panel. - Order TSH. - Order T4. - Order B1. - Consider sleep study if labs are normal.  Anxiety Anxiety managed with clonazepam . Controlled medication contract and drug screening up to date. - Refill clonazepam .  Weight management Severe stomach cramps with metformin . Family history of thyroid  cancer contraindicates GLP-1 receptor agonists. Wellbutrin  considered for weight management and mood improvement. - Prescribe Wellbutrin . - Discontinue metformin .  Gastroesophageal reflux disease (GERD) Famotidine  needed for symptom control. Acknowledged delayed onset of action. - Prescribe famotidine .  Follow up date to be determined after lab review

## 2024-01-08 ENCOUNTER — Other Ambulatory Visit

## 2024-01-10 ENCOUNTER — Other Ambulatory Visit (HOSPITAL_BASED_OUTPATIENT_CLINIC_OR_DEPARTMENT_OTHER): Payer: Self-pay

## 2024-01-12 ENCOUNTER — Encounter: Payer: Self-pay | Admitting: Medical

## 2024-01-12 DIAGNOSIS — Z419 Encounter for procedure for purposes other than remedying health state, unspecified: Secondary | ICD-10-CM | POA: Diagnosis not present

## 2024-01-14 ENCOUNTER — Other Ambulatory Visit: Payer: Self-pay | Admitting: Medical

## 2024-01-14 ENCOUNTER — Other Ambulatory Visit (HOSPITAL_BASED_OUTPATIENT_CLINIC_OR_DEPARTMENT_OTHER): Payer: Self-pay

## 2024-01-15 ENCOUNTER — Other Ambulatory Visit (HOSPITAL_BASED_OUTPATIENT_CLINIC_OR_DEPARTMENT_OTHER): Payer: Self-pay

## 2024-01-15 MED ORDER — AMPHETAMINE-DEXTROAMPHETAMINE 20 MG PO TABS
20.0000 mg | ORAL_TABLET | Freq: Two times a day (BID) | ORAL | 0 refills | Status: DC
  Filled 2024-01-15: qty 60, 30d supply, fill #0

## 2024-01-15 NOTE — Addendum Note (Signed)
 Addended by: DORINA DALLAS HERO on: 01/15/2024 08:47 AM   Modules accepted: Orders

## 2024-01-16 NOTE — Telephone Encounter (Signed)
 Rx adderall sent. Not sure why got duplicate request. Please make sure rx sent succesfully.

## 2024-01-17 NOTE — Telephone Encounter (Signed)
 Rx was sent thank you

## 2024-01-23 ENCOUNTER — Other Ambulatory Visit (INDEPENDENT_AMBULATORY_CARE_PROVIDER_SITE_OTHER)

## 2024-01-23 DIAGNOSIS — R5383 Other fatigue: Secondary | ICD-10-CM

## 2024-01-23 DIAGNOSIS — E538 Deficiency of other specified B group vitamins: Secondary | ICD-10-CM

## 2024-01-24 LAB — CBC WITH DIFFERENTIAL/PLATELET
Basophils Absolute: 0.1 K/uL (ref 0.0–0.1)
Basophils Relative: 1 % (ref 0.0–3.0)
Eosinophils Absolute: 0.4 K/uL (ref 0.0–0.7)
Eosinophils Relative: 6.8 % — ABNORMAL HIGH (ref 0.0–5.0)
HCT: 44.7 % (ref 39.0–52.0)
Hemoglobin: 15 g/dL (ref 13.0–17.0)
Lymphocytes Relative: 33.5 % (ref 12.0–46.0)
Lymphs Abs: 2 K/uL (ref 0.7–4.0)
MCHC: 33.4 g/dL (ref 30.0–36.0)
MCV: 91.4 fl (ref 78.0–100.0)
Monocytes Absolute: 0.5 K/uL (ref 0.1–1.0)
Monocytes Relative: 8 % (ref 3.0–12.0)
Neutro Abs: 3 K/uL (ref 1.4–7.7)
Neutrophils Relative %: 50.7 % (ref 43.0–77.0)
Platelets: 211 K/uL (ref 150.0–400.0)
RBC: 4.9 Mil/uL (ref 4.22–5.81)
RDW: 12.7 % (ref 11.5–15.5)
WBC: 6 K/uL (ref 4.0–10.5)

## 2024-01-24 LAB — TSH: TSH: 1.77 u[IU]/mL (ref 0.35–5.50)

## 2024-01-24 LAB — T4, FREE: Free T4: 0.77 ng/dL (ref 0.60–1.60)

## 2024-01-24 LAB — VITAMIN B12: Vitamin B-12: 339 pg/mL (ref 211–911)

## 2024-01-26 ENCOUNTER — Ambulatory Visit: Payer: Self-pay | Admitting: Medical

## 2024-01-26 DIAGNOSIS — E538 Deficiency of other specified B group vitamins: Secondary | ICD-10-CM

## 2024-01-28 NOTE — Telephone Encounter (Signed)
 Copied from CRM #[id number]. Topic: Clinical - Medical Advice >> Jan 28, 2024  1:38 PM [place] wrote: Reason for CRM: Patient wanting to start back with getting B12 shots, looking for an order from Edward Saguier so he can get scheduled

## 2024-01-28 NOTE — Telephone Encounter (Signed)
Called pt apt scheduled

## 2024-01-29 LAB — COMPLETE METABOLIC PANEL WITHOUT GFR
AG Ratio: 2.1 (calc) (ref 1.0–2.5)
ALT: 19 U/L (ref 9–46)
AST: 18 U/L (ref 10–40)
Albumin: 4.6 g/dL (ref 3.6–5.1)
Alkaline phosphatase (APISO): 68 U/L (ref 36–130)
BUN: 12 mg/dL (ref 7–25)
CO2: 26 mmol/L (ref 20–32)
Calcium: 9.5 mg/dL (ref 8.6–10.3)
Chloride: 105 mmol/L (ref 98–110)
Creat: 1.02 mg/dL (ref 0.60–1.24)
Globulin: 2.2 g/dL (ref 1.9–3.7)
Glucose, Bld: 99 mg/dL (ref 65–99)
Potassium: 4.6 mmol/L (ref 3.5–5.3)
Sodium: 139 mmol/L (ref 135–146)
Total Bilirubin: 1.2 mg/dL (ref 0.2–1.2)
Total Protein: 6.8 g/dL (ref 6.1–8.1)

## 2024-01-29 LAB — VITAMIN B1: Vitamin B1 (Thiamine): 8 nmol/L (ref 8–30)

## 2024-01-30 ENCOUNTER — Ambulatory Visit

## 2024-01-30 DIAGNOSIS — E538 Deficiency of other specified B group vitamins: Secondary | ICD-10-CM | POA: Diagnosis not present

## 2024-01-30 MED ORDER — CYANOCOBALAMIN 1000 MCG/ML IJ SOLN
1000.0000 ug | Freq: Once | INTRAMUSCULAR | Status: AC
  Administered 2024-01-30: 1000 ug via INTRAMUSCULAR

## 2024-01-30 NOTE — Progress Notes (Signed)
 Pt here for monthly B12 injection per original order dated: Per Edward Saguier,PA-C on 01/28/24 do injection b12 every other month.   Last B12 injection: 10/24/23  Last B12 level:  01/23/24  B12 1000mcg given IM, left deltoid and pt tolerated injection well.  Next B12 injection scheduled for: 04/01/24

## 2024-02-12 ENCOUNTER — Other Ambulatory Visit: Payer: Self-pay | Admitting: Medical

## 2024-02-12 ENCOUNTER — Other Ambulatory Visit (HOSPITAL_BASED_OUTPATIENT_CLINIC_OR_DEPARTMENT_OTHER): Payer: Self-pay

## 2024-02-12 DIAGNOSIS — Z419 Encounter for procedure for purposes other than remedying health state, unspecified: Secondary | ICD-10-CM | POA: Diagnosis not present

## 2024-02-12 MED ORDER — AMPHETAMINE-DEXTROAMPHETAMINE 20 MG PO TABS
20.0000 mg | ORAL_TABLET | Freq: Two times a day (BID) | ORAL | 0 refills | Status: DC
  Filled 2024-02-12: qty 60, 30d supply, fill #0

## 2024-02-13 ENCOUNTER — Other Ambulatory Visit (HOSPITAL_BASED_OUTPATIENT_CLINIC_OR_DEPARTMENT_OTHER): Payer: Self-pay

## 2024-03-04 ENCOUNTER — Ambulatory Visit (INDEPENDENT_AMBULATORY_CARE_PROVIDER_SITE_OTHER): Admitting: Medical

## 2024-03-04 ENCOUNTER — Other Ambulatory Visit (HOSPITAL_BASED_OUTPATIENT_CLINIC_OR_DEPARTMENT_OTHER): Payer: Self-pay

## 2024-03-04 VITALS — BP 124/80 | HR 63 | Resp 16 | Ht 73.0 in | Wt 261.4 lb

## 2024-03-04 DIAGNOSIS — L732 Hidradenitis suppurativa: Secondary | ICD-10-CM

## 2024-03-04 DIAGNOSIS — L089 Local infection of the skin and subcutaneous tissue, unspecified: Secondary | ICD-10-CM

## 2024-03-04 DIAGNOSIS — J309 Allergic rhinitis, unspecified: Secondary | ICD-10-CM

## 2024-03-04 MED ORDER — DOXYCYCLINE HYCLATE 100 MG PO TABS
100.0000 mg | ORAL_TABLET | Freq: Two times a day (BID) | ORAL | 0 refills | Status: DC
  Filled 2024-03-04: qty 20, 10d supply, fill #0

## 2024-03-04 MED ORDER — FLUTICASONE PROPIONATE 50 MCG/ACT NA SUSP
2.0000 | Freq: Every day | NASAL | 2 refills | Status: AC
  Filled 2024-03-04: qty 16, 30d supply, fill #0

## 2024-03-04 MED ORDER — LEVOCETIRIZINE DIHYDROCHLORIDE 5 MG PO TABS
5.0000 mg | ORAL_TABLET | Freq: Every evening | ORAL | 3 refills | Status: AC
  Filled 2024-03-04 (×2): qty 30, 30d supply, fill #0

## 2024-03-04 NOTE — Progress Notes (Signed)
 Subjective:    Patient ID: Tracy Rich, male    DOB: 1995/07/23, 28 y.o.   MRN: [id number]  HPI  KIP CROPP is a 28 year old male who presents with nasal congestion and a flare-up of hidradenitis suppurativa.  He has been experiencing nasal congestion for four days, primarily localized to the maxillary area and ethmoid area. Initially, he had sneezing yesterday, which has since resolved. He describes a sensation of pressure at the bridge of his nose. No itching or watery eyes. He has been using over-the-counter Benadryl and has a history of using Flonase  nasal spray, which he still has available.  He reports a flare-up of hidradenitis suppurativa in the medial thigh area, which is less severe than previous episodes. He has a history of multiple scars in the same region and has previously been treated with doxycycline , which resolved symptoms quickly within two to three days. He has previously required drainage at urgent care. He has been prescribed doxycycline  in the past, which he found effective in resolving symptoms.  He has seen a dermatologist twice in the past, who diagnosed him with hidradenitis suppurativa, although this was not documented in his medical records. He was provided with a cream or lotion to help with scarring. He has not seen a dermatologist recently and does not recall the exact age when he last consulted one.  No sneezing today, itching or watery eyes, and no current headache. He confirms nasal congestion and pressure at the bridge of his nose.     Review of Systems  Constitutional:  Negative for chills, fatigue and fever.  HENT:  Positive for congestion, postnasal drip and sneezing.   Eyes:  Negative for pain and redness.  Respiratory:  Negative for cough, chest tightness and wheezing.   Cardiovascular:  Negative for chest pain and palpitations.  Gastrointestinal:  Negative for abdominal pain.  Musculoskeletal:  Negative for back pain and myalgias.  Skin:   Negative for rash.       See rt thigh hpi.  Neurological:  Negative for dizziness, speech difficulty, weakness and light-headedness.  Hematological:  Negative for adenopathy. Does not bruise/bleed easily.  Psychiatric/Behavioral:  Negative for behavioral problems and decreased concentration. The patient is not nervous/anxious.      Past Medical History:  Diagnosis Date   Anxiety    Hydradenitis 01/03/2021   Patient states history of hidradenitis suppurativa     Social History   Socioeconomic History   Marital status: Single    Spouse name: Not on file   Number of children: Not on file   Years of education: Not on file   Highest education level: Not on file  Occupational History   Not on file  Tobacco Use   Smoking status: Former    Types: Cigarettes   Smokeless tobacco: Never  Vaping Use   Vaping status: Never Used  Substance and Sexual Activity   Alcohol use: No   Drug use: No   Sexual activity: Not on file  Other Topics Concern   Not on file  Social History Narrative   Caffeine- once every other day   Social Drivers of Health   Financial Resource Strain: Not on file  Food Insecurity: Not on file  Transportation Needs: Not on file  Physical Activity: Not on file  Stress: Not on file  Social Connections: Unknown (11/15/2021)   Received [facility]   Social Network    Social Network: Not on file  Intimate Partner Violence: Unknown (  10/07/2021)   Received from [facility]   HITS    Physically Hurt: Not on file    Insult or Talk Down To: Not on file    Threaten Physical Harm: Not on file    Scream or Curse: Not on file    Past Surgical History:  Procedure Laterality Date   CHOLECYSTECTOMY N/A 02/12/2023   Procedure: LAPAROSCOPIC CHOLECYSTECTOMY;  Surgeon: Signe Mitzie LABOR, MD;  Location: [facility] OR;  Service: General;  Laterality: N/A;   INTRAOPERATIVE CHOLANGIOGRAM N/A 02/12/2023   Procedure: INTRAOPERATIVE CHOLANGIOGRAM;  Surgeon: Signe Mitzie LABOR, MD;   Location: [facility] OR;  Service: General;  Laterality: N/A;   TONSILLECTOMY     WISDOM TOOTH EXTRACTION      Family History  Problem Relation Age of Onset   Thyroid  disease Mother    Cancer Mother    Hypertension Mother    Thyroid  cancer Mother    GER disease Mother    Healthy Father    GER disease Father    Healthy Sister    Esophageal cancer Neg Hx    Colon cancer Neg Hx     No Known Allergies  Current Outpatient Medications on File Prior to Visit  Medication Sig Dispense Refill   amphetamine -dextroamphetamine  (ADDERALL) 20 MG tablet Take 1 tablet (20 mg total) by mouth 2 (two) times daily. 60 tablet 0   clonazePAM  (KLONOPIN ) 1 MG tablet Take 1 tablet (1 mg total) by mouth 2 (two) times daily as needed for anxiety. 60 tablet 1   famotidine  (PEPCID ) 20 MG tablet Take 1 tablet (20 mg total) by mouth 2 (two) times daily. 60 tablet 2   FLUoxetine  (PROZAC ) 10 MG capsule Take 1 capsule (10 mg total) by mouth daily. 30 capsule 0   fluticasone  (FLONASE ) 50 MCG/ACT nasal spray Place 2 sprays into both nostrils daily. 16 g 1   fluticasone  (FLONASE ) 50 MCG/ACT nasal spray Place 2 sprays into both nostrils daily. 16 g 1   hydrOXYzine  (ATARAX ) 10 MG tablet Take 1-2 tablets (10-20 mg total) by mouth at bedtime as needed for itching 20 tablet 0   metFORMIN  (GLUCOPHAGE ) 500 MG tablet Take 1 tablet (500 mg total) by mouth daily with breakfast. 30 tablet 0   methylPREDNISolone  (MEDROL  DOSEPAK) 4 MG TBPK tablet Take by mouth as directed see package for directions 21 each 0   tiZANidine  (ZANAFLEX ) 4 MG tablet Take 1 tablet (4 mg total) by mouth at bedtime. 5 tablet 0   Vitamin D , Ergocalciferol , (DRISDOL ) 1.25 MG (50000 UNIT) CAPS capsule Take 1 capsule (50,000 Units total) by mouth every 7 (seven) days. 8 capsule 0   [DISCONTINUED] metoprolol  succinate (TOPROL -XL) 25 MG 24 hr tablet Take 1 tablet (25 mg total) by mouth daily. (Patient not taking: Reported on 07/11/2023) 30 tablet 3   [DISCONTINUED]  sertraline  (ZOLOFT ) 25 MG tablet Take 1 tablet (25 mg total) by mouth daily. (Patient not taking: Reported on 09/15/2020) 30 tablet 0   No current facility-administered medications on file prior to visit.    BP 124/80   Pulse 63   Resp 16   Ht 6' 1 (1.854 m)   Wt 261 lb 6.4 oz (118.6 kg)   SpO2 99%   BMI 34.49 kg/m        Objective:   Physical Exam  General Mental Status- Alert. General Appearance- Not in acute distress.   Skin General: Color- Normal Color. Moisture- Normal Moisture.  Neck . No JVD.  Chest and Lung Exam Auscultation: Breath  Sounds:-CTA  Cardiovascular Auscultation:Rythm- RRR Murmurs & Other Heart Sounds:Auscultation of the heart reveals- No Murmurs.  Abdomen Inspection:-Inspeection Normal. Palpation/Percussion:Note:No mass. Palpation and Percussion of the abdomen reveal- Non Tender, Non Distended + BS, no rebound or guarding.   Neurologic Cranial Nerve exam:- CN III-XII intact(No nystagmus), symmetric smile. Strength:- 5/5 equal and symmetric strength both upper and lower extremities.   Heent- boggy turbinates, + PND. Mid ethmoid sinus pressure.    Skin- rt medial thigh various small scars and one follicle indurated and very tender but not fluctuant. No Dc.  Heent- Boggy turbinates, +pnd. No frontal or maxillary sinus pressure. Mild ethmoid sinus pressure.        Assessment & Plan:   Hidradenitis suppurativa with secondary skin infection, medial thigh Inflamed and tender area on medial thigh, less severe than past episodes requiring drainage. Previous doxycycline  treatment effective. - Prescribe doxycycline  100 mg, one tablet twice daily for ten days. - Advise taking doxycycline  with food. Rx advisement given. - Caution about sun sensitivity while on doxycycline . - Refer to dermatology for further evaluation and management due chronicity/reoccurences. - Schedule follow-up in ten days to assess resolution or earlier if symptoms worsen or  drainage occurs.  Allergic rhinitis with recent nasal congestion and ethmoid sinus pressure Recent nasal congestion and ethmoid sinus pressure with noted improvement. No sinus infection, but doxycycline  may help if infection develops. - Prescribe Flonase  nasal spray with two refills. - Prescribe Xyzal , one tablet at night as needed for allergy symptoms. - Advise monitoring symptoms as the allergy season progresses.  Follow up date to be determined depending on response to treatment.   Tatum Corl, PA-C

## 2024-03-04 NOTE — Patient Instructions (Addendum)
  Hidradenitis suppurativa with secondary skin infection, medial thigh Inflamed and tender area on medial thigh, less severe than past episodes requiring drainage. Previous doxycycline  treatment effective. - Prescribe doxycycline  100 mg, one tablet twice daily for ten days. - Advise taking doxycycline  with food. Rx advisement given. - Caution about sun sensitivity while on doxycycline . - Refer to dermatology for further evaluation and management due chronicity/reoccurences. - Schedule follow-up in ten days to assess resolution or earlier if symptoms worsen or drainage occurs.  Allergic rhinitis with recent nasal congestion and ethmoid sinus pressure Recent nasal congestion and ethmoid sinus pressure with noted improvement. No sinus infection, but doxycycline  may help if infection develops. - Prescribe Flonase  nasal spray with two refills. - Prescribe Xyzal , one tablet at night as needed for allergy symptoms. - Advise monitoring symptoms as the allergy season progresses.  Follow up date to be determined depending on response to treatment.      [facility] OFFICE [address] [place], [place] [address] Phone: [phone] Fax: [phone]

## 2024-03-11 ENCOUNTER — Telehealth: Payer: Self-pay

## 2024-03-11 NOTE — Telephone Encounter (Signed)
 Copied from CRM #[id number]. Topic: Referral - Status >> Mar 11, 2024  3:24 PM Anair[place] L wrote: Reason for CRM: [place] calling from [facility] dermatology to inform  us , patient insurance is out of network

## 2024-03-12 DIAGNOSIS — H5213 Myopia, bilateral: Secondary | ICD-10-CM | POA: Diagnosis not present

## 2024-03-13 ENCOUNTER — Other Ambulatory Visit: Payer: Self-pay | Admitting: Family

## 2024-03-13 ENCOUNTER — Other Ambulatory Visit (HOSPITAL_BASED_OUTPATIENT_CLINIC_OR_DEPARTMENT_OTHER): Payer: Self-pay

## 2024-03-13 ENCOUNTER — Other Ambulatory Visit: Payer: Self-pay | Admitting: Medical

## 2024-03-14 ENCOUNTER — Other Ambulatory Visit: Payer: Self-pay

## 2024-03-14 ENCOUNTER — Other Ambulatory Visit (HOSPITAL_BASED_OUTPATIENT_CLINIC_OR_DEPARTMENT_OTHER): Payer: Self-pay

## 2024-03-14 DIAGNOSIS — Z419 Encounter for procedure for purposes other than remedying health state, unspecified: Secondary | ICD-10-CM | POA: Diagnosis not present

## 2024-03-14 MED ORDER — CLONAZEPAM 1 MG PO TABS
1.0000 mg | ORAL_TABLET | Freq: Two times a day (BID) | ORAL | 1 refills | Status: DC | PRN
  Filled 2024-03-14: qty 60, 30d supply, fill #0
  Filled 2024-04-15: qty 60, 30d supply, fill #1

## 2024-03-14 NOTE — Telephone Encounter (Signed)
 Rx refill clonazepam  sent.

## 2024-03-17 ENCOUNTER — Other Ambulatory Visit (HOSPITAL_BASED_OUTPATIENT_CLINIC_OR_DEPARTMENT_OTHER): Payer: Self-pay

## 2024-03-17 ENCOUNTER — Other Ambulatory Visit (HOSPITAL_COMMUNITY): Payer: Self-pay

## 2024-03-17 ENCOUNTER — Other Ambulatory Visit: Payer: Self-pay | Admitting: Family

## 2024-03-18 ENCOUNTER — Ambulatory Visit (INDEPENDENT_AMBULATORY_CARE_PROVIDER_SITE_OTHER): Admitting: Medical

## 2024-03-18 ENCOUNTER — Other Ambulatory Visit (HOSPITAL_BASED_OUTPATIENT_CLINIC_OR_DEPARTMENT_OTHER): Payer: Self-pay

## 2024-03-18 VITALS — BP 112/82 | HR 51 | Temp 97.9°F | Resp 16 | Ht 73.0 in | Wt 272.8 lb

## 2024-03-18 DIAGNOSIS — R0981 Nasal congestion: Secondary | ICD-10-CM | POA: Diagnosis not present

## 2024-03-18 DIAGNOSIS — J309 Allergic rhinitis, unspecified: Secondary | ICD-10-CM

## 2024-03-18 DIAGNOSIS — R0989 Other specified symptoms and signs involving the circulatory and respiratory systems: Secondary | ICD-10-CM | POA: Diagnosis not present

## 2024-03-18 DIAGNOSIS — R059 Cough, unspecified: Secondary | ICD-10-CM

## 2024-03-18 DIAGNOSIS — J029 Acute pharyngitis, unspecified: Secondary | ICD-10-CM | POA: Diagnosis not present

## 2024-03-18 DIAGNOSIS — F909 Attention-deficit hyperactivity disorder, unspecified type: Secondary | ICD-10-CM | POA: Diagnosis not present

## 2024-03-18 LAB — POC COVID19 BINAXNOW: SARS Coronavirus 2 Ag: NEGATIVE

## 2024-03-18 LAB — POCT INFLUENZA A/B
Influenza A, POC: NEGATIVE
Influenza B, POC: NEGATIVE

## 2024-03-18 LAB — POCT RAPID STREP A (OFFICE): Rapid Strep A Screen: NEGATIVE

## 2024-03-18 MED ORDER — AMPHETAMINE-DEXTROAMPHETAMINE 20 MG PO TABS
20.0000 mg | ORAL_TABLET | Freq: Two times a day (BID) | ORAL | 0 refills | Status: DC
  Filled 2024-03-18: qty 60, 30d supply, fill #0

## 2024-03-18 MED ORDER — METHYLPREDNISOLONE 4 MG PO TABS
ORAL_TABLET | ORAL | 0 refills | Status: AC
Start: 2024-03-18 — End: 2024-03-21
  Filled 2024-03-18: qty 6, 3d supply, fill #0

## 2024-03-18 NOTE — Patient Instructions (Signed)
 Allergic rhinitis with acute exacerbation Acute exacerbation likely due to allergy flare and discontinuation of medications. Negative for flu, COVID, and strep. - Restart Xyzal  and Flonase . - Prescribe 3-day low-dose Medrol  taper if no improvement. - Advise MyChart message if symptoms worsen or for sinus pressure, chest congestion, or productive cough for Z-Pak consideration. - Monitor for wheezing and bronchitis due to smoking history.  Anxiety  Anxiety managed with clonazepam , effective but with caution due to misuse potential. - Continue clonazepam . - Refill clonazepam  as needed.  Attention-deficit hyperactivity disorder (ADHD) ADHD managed with Adderall, effective. Prescription refill issue noted. - Send Adderall prescription to pharmacy. - Advise MyChart message 3-4 days before refill needed.  Follow up 6 month-controlled med visit or sooner if needed

## 2024-03-18 NOTE — Progress Notes (Signed)
 Subjective:    Patient ID: Tracy Rich, male    DOB: 10/27/1995, 28 y.o.   MRN: [id number]  HPI  Tracy Rich is a 28 year old male who presents for a controlled medication visit and evaluation of chest congestion.  He recently experienced a recurrence of chest congestion, initially attributing it to a cold from a couple of weeks ago. The congestion is more pronounced in his chest rather than nasal. No fevers, chills, sweats, body aches, or sneezing. He has a little wheezing but no shortness of breath.  He is currently taking clonazepam  for anxiety and Adderall for concentration. He encountered an issue with his Adderall prescription being sent to another provider, delaying his refill. He has been taking clonazepam  with refills provided.  He has a history of allergies and was previously prescribed Xyzal  and Flonase , which he stopped taking recently. He reports a sore throat that started today, describing it as a 'good amount' of pain, especially when swallowing saliva. He has been using a cough spray but is unsure about swallowing it.  He has a history of smoking but quit two weeks ago.        Review of Systems  Constitutional:  Negative for chills and fatigue.  HENT:  Positive for congestion and sore throat. Negative for sinus pressure and sinus pain.   Respiratory:  Positive for wheezing. Negative for cough, chest tightness and stridor.        Chest congestion  Cardiovascular:  Negative for chest pain and palpitations.  Gastrointestinal:  Negative for abdominal pain, constipation, nausea and vomiting.  Genitourinary:  Negative for dysuria and frequency.  Neurological:  Negative for facial asymmetry.    Past Medical History:  Diagnosis Date   Anxiety    Hydradenitis 01/03/2021   Patient states history of hidradenitis suppurativa     Social History   Socioeconomic History   Marital status: Single    Spouse name: Not on file   Number of children: Not on file   Years  of education: Not on file   Highest education level: Not on file  Occupational History   Not on file  Tobacco Use   Smoking status: Former    Types: Cigarettes   Smokeless tobacco: Never  Vaping Use   Vaping status: Never Used  Substance and Sexual Activity   Alcohol use: No   Drug use: No   Sexual activity: Not on file  Other Topics Concern   Not on file  Social History Narrative   Caffeine- once every other day   Social Drivers of Health   Financial Resource Strain: Not on file  Food Insecurity: Not on file  Transportation Needs: Not on file  Physical Activity: Not on file  Stress: Not on file  Social Connections: Unknown (11/15/2021)   Received from [facility]   Social Network    Social Network: Not on file  Intimate Partner Violence: Unknown (10/07/2021)   Received from [facility]   HITS    Physically Hurt: Not on file    Insult or Talk Down To: Not on file    Threaten Physical Harm: Not on file    Scream or Curse: Not on file    Past Surgical History:  Procedure Laterality Date   CHOLECYSTECTOMY N/A 02/12/2023   Procedure: LAPAROSCOPIC CHOLECYSTECTOMY;  Surgeon: Signe Mitzie LABOR, MD;  Location: [facility] OR;  Service: General;  Laterality: N/A;   INTRAOPERATIVE CHOLANGIOGRAM N/A 02/12/2023   Procedure: INTRAOPERATIVE CHOLANGIOGRAM;  Surgeon: Signe,  Mitzie LABOR, MD;  Location: [facility] OR;  Service: General;  Laterality: N/A;   TONSILLECTOMY     WISDOM TOOTH EXTRACTION      Family History  Problem Relation Age of Onset   Thyroid  disease Mother    Cancer Mother    Hypertension Mother    Thyroid  cancer Mother    GER disease Mother    Healthy Father    GER disease Father    Healthy Sister    Esophageal cancer Neg Hx    Colon cancer Neg Hx     No Known Allergies  Current Outpatient Medications on File Prior to Visit  Medication Sig Dispense Refill   amphetamine -dextroamphetamine  (ADDERALL) 20 MG tablet Take 1 tablet (20 mg total) by mouth 2 (two) times daily. 60  tablet 0   clonazePAM  (KLONOPIN ) 1 MG tablet Take 1 tablet (1 mg total) by mouth 2 (two) times daily as needed for anxiety. 60 tablet 1   doxycycline  (VIBRA -TABS) 100 MG tablet Take 1 tablet (100 mg total) by mouth 2 (two) times daily. 20 tablet 0   famotidine  (PEPCID ) 20 MG tablet Take 1 tablet (20 mg total) by mouth 2 (two) times daily. 60 tablet 2   FLUoxetine  (PROZAC ) 10 MG capsule Take 1 capsule (10 mg total) by mouth daily. 30 capsule 0   fluticasone  (FLONASE ) 50 MCG/ACT nasal spray Place 2 sprays into both nostrils daily. 16 g 1   fluticasone  (FLONASE ) 50 MCG/ACT nasal spray Place 2 sprays into both nostrils daily. 16 g 1   fluticasone  (FLONASE ) 50 MCG/ACT nasal spray Place 2 sprays into both nostrils daily. 16 g 2   hydrOXYzine  (ATARAX ) 10 MG tablet Take 1-2 tablets (10-20 mg total) by mouth at bedtime as needed for itching 20 tablet 0   levocetirizine (XYZAL ) 5 MG tablet Take 1 tablet (5 mg total) by mouth every evening. 30 tablet 3   metFORMIN  (GLUCOPHAGE ) 500 MG tablet Take 1 tablet (500 mg total) by mouth daily with breakfast. 30 tablet 0   methylPREDNISolone  (MEDROL  DOSEPAK) 4 MG TBPK tablet Take by mouth as directed see package for directions 21 each 0   tiZANidine  (ZANAFLEX ) 4 MG tablet Take 1 tablet (4 mg total) by mouth at bedtime. 5 tablet 0   Vitamin D , Ergocalciferol , (DRISDOL ) 1.25 MG (50000 UNIT) CAPS capsule Take 1 capsule (50,000 Units total) by mouth every 7 (seven) days. 8 capsule 0   [DISCONTINUED] sertraline  (ZOLOFT ) 25 MG tablet Take 1 tablet (25 mg total) by mouth daily. (Patient not taking: Reported on 09/15/2020) 30 tablet 0   No current facility-administered medications on file prior to visit.    BP 112/82   Pulse (!) 51   Temp 97.9 F (36.6 C) (Oral)   Resp 16   Ht 6' 1 (1.854 m)   Wt 272 lb 12.8 oz (123.7 kg)   SpO2 96%   BMI 35.99 kg/m        Objective:   Physical Exam  General Mental Status- Alert. General Appearance- Not in acute distress.    Skin General: Color- Normal Color. Moisture- Normal Moisture.  Neck  No JVD.  Chest and Lung Exam Auscultation: Breath Sounds:-CTA  Cardiovascular Auscultation:Rythm- RRR Murmurs & Other Heart Sounds:Auscultation of the heart reveals- No Murmurs.  Abdomen Inspection:-Inspeection Normal. Palpation/Percussion:Note:No mass. Palpation and Percussion of the abdomen reveal- Non Tender, Non Distended + BS, no rebound or guarding.   Neurologic Cranial Nerve exam:- CN III-XII intact(No nystagmus), symmetric smile. Strength:- 5/5 equal and symmetric strength  both upper and lower extremities.   Heent- boggy turbinates. No sinus pressure. + pnd. Mild red pharynx.     Assessment & Plan:   Patient Instructions  Allergic rhinitis with acute exacerbation Acute exacerbation likely due to allergy flare and discontinuation of medications. Negative for flu, COVID, and strep. - Restart Xyzal  and Flonase . - Prescribe 3-day low-dose Medrol  taper if no improvement. - Advise MyChart message if symptoms worsen or for sinus pressure, chest congestion, or productive cough for Z-Pak consideration. - Monitor for wheezing and bronchitis due to smoking history.  Anxiety  Anxiety managed with clonazepam , effective but with caution due to misuse potential. - Continue clonazepam . - Refill clonazepam  as needed.  Attention-deficit hyperactivity disorder (ADHD) ADHD managed with Adderall, effective. Prescription refill issue noted. - Send Adderall prescription to pharmacy. - Advise MyChart message 3-4 days before refill needed.  Follow up 6 month-controlled med visit or sooner if needed   [employer], PA-C

## 2024-04-01 ENCOUNTER — Ambulatory Visit (INDEPENDENT_AMBULATORY_CARE_PROVIDER_SITE_OTHER)

## 2024-04-01 DIAGNOSIS — E538 Deficiency of other specified B group vitamins: Secondary | ICD-10-CM

## 2024-04-01 MED ORDER — CYANOCOBALAMIN 1000 MCG/ML IJ SOLN
1000.0000 ug | Freq: Once | INTRAMUSCULAR | Status: AC
  Administered 2024-04-01: 1000 ug via INTRAMUSCULAR

## 2024-04-01 NOTE — Progress Notes (Signed)
 Pt here for every other month B12 injection per PCP  Last B12 injection: 01/30/2024  B12 1000mcg given IM L deltoid, and pt tolerated injection well.  Next B12 injection scheduled for: 05/27/2024.

## 2024-04-14 ENCOUNTER — Other Ambulatory Visit (HOSPITAL_BASED_OUTPATIENT_CLINIC_OR_DEPARTMENT_OTHER): Payer: Self-pay

## 2024-04-14 ENCOUNTER — Other Ambulatory Visit: Payer: Self-pay | Admitting: Medical

## 2024-04-14 ENCOUNTER — Encounter: Payer: Self-pay | Admitting: Medical

## 2024-04-14 MED ORDER — AMPHETAMINE-DEXTROAMPHETAMINE 20 MG PO TABS
20.0000 mg | ORAL_TABLET | Freq: Two times a day (BID) | ORAL | 0 refills | Status: DC
  Filled 2024-04-14: qty 60, 30d supply, fill #0

## 2024-04-14 NOTE — Addendum Note (Signed)
 Addended by: DORINA DALLAS HERO on: 04/14/2024 10:11 PM   Modules accepted: Orders

## 2024-04-15 ENCOUNTER — Other Ambulatory Visit (HOSPITAL_BASED_OUTPATIENT_CLINIC_OR_DEPARTMENT_OTHER): Payer: Self-pay

## 2024-05-01 ENCOUNTER — Encounter: Payer: Self-pay | Admitting: Medical

## 2024-05-01 ENCOUNTER — Other Ambulatory Visit (HOSPITAL_BASED_OUTPATIENT_CLINIC_OR_DEPARTMENT_OTHER): Payer: Self-pay

## 2024-05-01 MED ORDER — FAMOTIDINE 20 MG PO TABS
20.0000 mg | ORAL_TABLET | Freq: Every day | ORAL | 0 refills | Status: AC
  Filled 2024-05-01: qty 30, 30d supply, fill #0

## 2024-05-01 MED ORDER — METFORMIN HCL 500 MG PO TABS
500.0000 mg | ORAL_TABLET | Freq: Every day | ORAL | 3 refills | Status: AC
  Filled 2024-05-01: qty 90, 90d supply, fill #0

## 2024-05-01 MED ORDER — FAMOTIDINE 20 MG PO TABS
20.0000 mg | ORAL_TABLET | Freq: Two times a day (BID) | ORAL | 0 refills | Status: AC
  Filled 2024-05-01: qty 180, 90d supply, fill #0

## 2024-05-01 NOTE — Addendum Note (Signed)
 Addended by: DORINA DALLAS HERO on: 05/01/2024 04:59 PM   Modules accepted: Orders

## 2024-05-03 DIAGNOSIS — R059 Cough, unspecified: Secondary | ICD-10-CM | POA: Diagnosis not present

## 2024-05-03 DIAGNOSIS — J019 Acute sinusitis, unspecified: Secondary | ICD-10-CM | POA: Diagnosis not present

## 2024-05-03 DIAGNOSIS — Z20822 Contact with and (suspected) exposure to covid-19: Secondary | ICD-10-CM | POA: Diagnosis not present

## 2024-05-14 ENCOUNTER — Encounter: Payer: Self-pay | Admitting: Medical

## 2024-05-14 DIAGNOSIS — Z419 Encounter for procedure for purposes other than remedying health state, unspecified: Secondary | ICD-10-CM | POA: Diagnosis not present

## 2024-05-15 ENCOUNTER — Other Ambulatory Visit (HOSPITAL_BASED_OUTPATIENT_CLINIC_OR_DEPARTMENT_OTHER): Payer: Self-pay

## 2024-05-15 MED ORDER — AMPHETAMINE-DEXTROAMPHETAMINE 20 MG PO TABS
20.0000 mg | ORAL_TABLET | Freq: Two times a day (BID) | ORAL | 0 refills | Status: DC
  Filled 2024-05-15: qty 60, 30d supply, fill #0

## 2024-05-15 MED ORDER — CLONAZEPAM 1 MG PO TABS
1.0000 mg | ORAL_TABLET | Freq: Two times a day (BID) | ORAL | 1 refills | Status: DC | PRN
  Filled 2024-05-15: qty 60, 30d supply, fill #0
  Filled 2024-06-13: qty 60, 30d supply, fill #1

## 2024-05-15 NOTE — Addendum Note (Signed)
 Addended by: DORINA DALLAS HERO on: 05/15/2024 04:47 PM   Modules accepted: Orders

## 2024-05-27 ENCOUNTER — Ambulatory Visit

## 2024-06-06 ENCOUNTER — Emergency Department (HOSPITAL_BASED_OUTPATIENT_CLINIC_OR_DEPARTMENT_OTHER)
Admission: EM | Admit: 2024-06-06 | Discharge: 2024-06-06 | Disposition: A | Discharge status: Home / Self Care | Attending: Emergency Medicine | Admitting: Emergency Medicine

## 2024-06-06 ENCOUNTER — Encounter (HOSPITAL_BASED_OUTPATIENT_CLINIC_OR_DEPARTMENT_OTHER): Payer: Self-pay

## 2024-06-06 ENCOUNTER — Other Ambulatory Visit: Payer: Self-pay

## 2024-06-06 ENCOUNTER — Emergency Department (HOSPITAL_BASED_OUTPATIENT_CLINIC_OR_DEPARTMENT_OTHER)

## 2024-06-06 DIAGNOSIS — Z23 Encounter for immunization: Secondary | ICD-10-CM | POA: Insufficient documentation

## 2024-06-06 DIAGNOSIS — S91331A Puncture wound without foreign body, right foot, initial encounter: Secondary | ICD-10-CM | POA: Insufficient documentation

## 2024-06-06 DIAGNOSIS — W450XXA Nail entering through skin, initial encounter: Secondary | ICD-10-CM | POA: Insufficient documentation

## 2024-06-06 DIAGNOSIS — Y990 Civilian activity done for income or pay: Secondary | ICD-10-CM | POA: Insufficient documentation

## 2024-06-06 DIAGNOSIS — S91231A Puncture wound without foreign body of right great toe with damage to nail, initial encounter: Secondary | ICD-10-CM | POA: Diagnosis not present

## 2024-06-06 MED ORDER — CEPHALEXIN 500 MG PO CAPS
500.0000 mg | ORAL_CAPSULE | Freq: Four times a day (QID) | ORAL | 0 refills | Status: DC
  Filled 2024-06-06: qty 20, 5d supply, fill #0

## 2024-06-06 MED ORDER — CEPHALEXIN 500 MG PO CAPS: 500.0000 mg | ORAL_CAPSULE | Freq: Four times a day (QID) | ORAL | 0 refills | Status: AC

## 2024-06-06 MED ORDER — TETANUS-DIPHTH-ACELL PERTUSSIS 5-2-15.5 LF-MCG/0.5 IM SUSP
0.5000 mL | Freq: Once | INTRAMUSCULAR | Status: AC
  Administered 2024-06-06: 0.5 mL via INTRAMUSCULAR
  Filled 2024-06-06: qty 0.5

## 2024-06-06 NOTE — ED Triage Notes (Signed)
 Stepped on rusty nail on right foot, pt reports it was bleeding, this is controlled now. Unsure if tetanus is up to date.

## 2024-06-06 NOTE — Discharge Instructions (Signed)
 You are seen today for injury by nail to the foot.  Due to the nail being very dirty and penetrating into the skin, I am in the home with antibiotics which you will take 4 times a day for the next 5 days.  Please take with food to avoid stomach upset.  You had your tetanus shot updated today.  Keep clean with soap and water.  Return to the ER if and have any new or worsening symptoms including spreading redness of the skin, pus drainage from the wound site, fever, uncontrollable pain.

## 2024-06-06 NOTE — ED Provider Notes (Signed)
 [facility] Provider Note   CSN: [id number] Arrival date & time: 06/06/24  1911     Patient presents with: Puncture Wound (Nail)   Tracy Rich is a 28 y.o. male.  HPI Patient is a 28 year old male presenting today for concerns for having stepped on a rusty nail while at work, penetrating his foot approximately 1 cm before taking off the shoe.  Nail was fully removed according to patient.  Bleeding controlled this time.  Has been able to limp on foot since the injury but has mild pain at wound site with minimal pain to surrounding area.  History of anxiety and hidradenitis  Denies numbness, weakness, tingling.    Prior to Admission medications   Medication Sig Start Date End Date Taking? Authorizing Provider  cephALEXin  (KEFLEX ) 500 MG capsule Take 1 capsule (500 mg total) by mouth 4 (four) times daily. 06/06/24  Yes Beola Terrall RAMAN, PA-C  amphetamine -dextroamphetamine  (ADDERALL) 20 MG tablet Take 1 tablet (20 mg total) by mouth 2 (two) times daily. 05/15/24   Saguier, [place], PA-C  clonazePAM  (KLONOPIN ) 1 MG tablet Take 1 tablet (1 mg total) by mouth 2 (two) times daily as needed for anxiety. 05/15/24   Saguier, [place], PA-C  doxycycline  (VIBRA -TABS) 100 MG tablet Take 1 tablet (100 mg total) by mouth 2 (two) times daily. 03/04/24   Saguier, [place], PA-C  famotidine  (PEPCID ) 20 MG tablet Take 1 tablet (20 mg total) by mouth daily. 05/01/24   Saguier, [place], PA-C  famotidine  (PEPCID ) 20 MG tablet Take 1 tablet (20 mg total) by mouth 2 (two) times daily. 05/01/24   Saguier, [place], PA-C  FLUoxetine  (PROZAC ) 10 MG capsule Take 1 capsule (10 mg total) by mouth daily. 09/18/23   Saguier, [place], PA-C  fluticasone  (FLONASE ) 50 MCG/ACT nasal spray Place 2 sprays into both nostrils daily. 09/18/23   Saguier, [place], PA-C  fluticasone  (FLONASE ) 50 MCG/ACT nasal spray Place 2 sprays into both nostrils daily. 10/31/23   Saguier, [place], PA-C   fluticasone  (FLONASE ) 50 MCG/ACT nasal spray Place 2 sprays into both nostrils daily. 03/04/24   Saguier, [place], PA-C  hydrOXYzine  (ATARAX ) 10 MG tablet Take 1-2 tablets (10-20 mg total) by mouth at bedtime as needed for itching 07/11/23   Saguier, [place], PA-C  levocetirizine (XYZAL ) 5 MG tablet Take 1 tablet (5 mg total) by mouth every evening. 03/04/24   Saguier, [place], PA-C  metFORMIN  (GLUCOPHAGE ) 500 MG tablet Take 1 tablet (500 mg total) by mouth daily with breakfast. 08/03/23   Saguier, [place], PA-C  metFORMIN  (GLUCOPHAGE ) 500 MG tablet Take 1 tablet (500 mg total) by mouth daily with breakfast. 05/01/24   Saguier, [place], PA-C  tiZANidine  (ZANAFLEX ) 4 MG tablet Take 1 tablet (4 mg total) by mouth at bedtime. 07/27/23   Saguier, [place], PA-C  Vitamin D , Ergocalciferol , (DRISDOL ) 1.25 MG (50000 UNIT) CAPS capsule Take 1 capsule (50,000 Units total) by mouth every 7 (seven) days. 04/23/23   Saguier, [place], PA-C  sertraline  (ZOLOFT ) 25 MG tablet Take 1 tablet (25 mg total) by mouth daily. Patient not taking: Reported on 09/15/2020 06/15/20 09/23/20  Saguier, Dallas, PA-C    Allergies: Patient has no known allergies.    Review of Systems  Musculoskeletal:  Positive for myalgias.  All other systems reviewed and are negative.   Updated Vital Signs BP (!) 130/90 (BP Location: Right Arm)   Pulse 85   Temp 98.8 F (37.1 C) (Oral)   Resp 15   Ht 6' (  1.829 m)   Wt 113.4 kg   SpO2 100%   BMI 33.91 kg/m   Physical Exam Vitals and nursing note reviewed.  Constitutional:      General: He is not in acute distress.    Appearance: Normal appearance. He is not ill-appearing or diaphoretic.  HENT:     Head: Normocephalic and atraumatic.  Eyes:     General: No scleral icterus.       Right eye: No discharge.        Left eye: No discharge.     Extraocular Movements: Extraocular movements intact.     Conjunctiva/sclera: Conjunctivae normal.  Cardiovascular:     Rate and Rhythm: Normal rate and  regular rhythm.     Pulses: Normal pulses.     Heart sounds: Normal heart sounds. No murmur heard.    No friction rub. No gallop.  Pulmonary:     Effort: Pulmonary effort is normal. No respiratory distress.     Breath sounds: No stridor. No wheezing, rhonchi or rales.  Chest:     Chest wall: No tenderness.  Abdominal:     General: Abdomen is flat. There is no distension.     Palpations: Abdomen is soft.     Tenderness: There is no abdominal tenderness. There is no right CVA tenderness, left CVA tenderness, guarding or rebound.  Musculoskeletal:        General: Signs of injury (Notably had small puncture wound to distal plantar aspect of right foot with bleeding controlled.) present. No swelling or deformity. Normal range of motion.     Cervical back: Normal range of motion. No rigidity.     Right lower leg: No edema.     Left lower leg: No edema.     Comments: Has full range of motion, full sensation to right foot and ankle and the toes.  Good DP pulse.  Bleeding controlled.  Unremarkable exam otherwise.  Skin:    General: Skin is warm and dry.     Findings: No bruising, erythema or lesion.  Neurological:     General: No focal deficit present.     Mental Status: He is alert and oriented to person, place, and time. Mental status is at baseline.     Sensory: No sensory deficit.     Motor: No weakness.  Psychiatric:        Mood and Affect: Mood normal.     (all labs ordered are listed, but only abnormal results are displayed) Labs Reviewed - No data to display  EKG: None  Radiology: DG Foot Complete Right Result Date: 06/06/2024 EXAM: 3 OR MORE VIEW(S) XRAY OF THE RIGHT FOOT 06/06/2024 09:18:00 PM COMPARISON: None available. CLINICAL HISTORY: stepped on nail penetrating distal midline foot. self removed nail FINDINGS: BONES AND JOINTS: No acute fracture. No malalignment. SOFT TISSUES: No radiopaque foreign body in the soft tissues. IMPRESSION: 1. No radiopaque foreign body in the  soft tissues of the foot detected. 2. No acute bony abnormality. Electronically signed by: Franky Crease MD 06/06/2024 09:23 PM EST RP Workstation: [id number]   Procedures   Medications Ordered in the ED  Tdap (ADACEL ) injection 0.5 mL (0.5 mLs Intramuscular Given 06/06/24 2110)    Clinical Course as of 06/06/24 2232  Fri Jun 06, 2024  2130 DG Foot Complete Right No acute fracture or bony abnormality or foreign bodies noted on x-ray. [CB]    Clinical Course User Index [CB] Beola Terrall RAMAN, PA-C  Medical Decision Making Amount and/or Complexity of Data Reviewed Radiology: ordered. Decision-making details documented in ED Course.  Risk Prescription drug management.  This patient is a 28 year old male who presents to the ED for concern of right puncture injury to nail to plantar aspect of right foot.  Happening today while at work, noted to be very rusty nail. Nail was removed when she was taken off.  Bleeding controlled.  On physical exam, patient is in no acute distress, afebrile, alert and orient x 4, speaking in full sentences, nontachypneic, nontachycardic.  Notably has small puncture wound to distal midline of right plantar aspect of foot.  Bleeding controlled.  No pain to surrounding osseous structures, no erythema.  Full nail was visualized in shoe which appeared to be fully removed without any missing pieces.  Unremarkable otherwise.  X-ray did not show any acute abnormality or foreign body.  Due to patient's unknown tetanus status, tetanus was updated.  Will also send home with antibiotics due to the depth of puncture and nail being contaminated.  Patient vital signs have remained stable throughout the course of patient's time in the ED. Low suspicion for any other emergent pathology at this time. I believe this patient is safe to be discharged. Provided strict return to ER precautions. Patient expressed agreement and understanding of plan. All  questions were answered.  Differential diagnoses prior to evaluation: The emergent differential diagnosis includes, but is not limited to, fracture, ligamentous injury, neurovascular injury, dislocation, malalignment, foreign body, open fracture. This is not an exhaustive differential.   Past Medical History / Co-morbidities / Social History:  Anxiety, hidradenitis   Additional history: Chart reviewed.   Lab Tests/Imaging studies: I personally interpreted labs/imaging and the pertinent results include:    X-ray of right foot does not show any acute bony abnormality or foreign body.   I agree with the radiologist interpretation.  Medications: I ordered medication including Keflex .  I have reviewed the patients home medicines and have made adjustments as needed.  Critical Interventions: None  Social Determinants of Health: None  Disposition: After consideration of the diagnostic results and the patients response to treatment, I feel that the patient would benefit from discharge and show as above.   emergency department workup does not suggest an emergent condition requiring admission or immediate intervention beyond what has been performed at this time. The plan is: Follow-up with PCP as needed, antibiotics, wound care, return for any new or worsening symptoms. The patient is safe for discharge and has been instructed to return immediately for worsening symptoms, change in symptoms or any other concerns.   Final diagnoses:  Injury by nail, initial encounter    ED Discharge Orders          Ordered    cephALEXin  (KEFLEX ) 500 MG capsule  4 times daily        06/06/24 2225               Halina Asano S, PA-C 06/06/24 2232    Kingsley, Victoria K, DO 06/06/24 2323

## 2024-06-07 ENCOUNTER — Other Ambulatory Visit (HOSPITAL_BASED_OUTPATIENT_CLINIC_OR_DEPARTMENT_OTHER): Payer: Self-pay

## 2024-06-08 ENCOUNTER — Other Ambulatory Visit: Payer: Self-pay

## 2024-06-13 ENCOUNTER — Other Ambulatory Visit: Payer: Self-pay | Admitting: Medical

## 2024-06-13 ENCOUNTER — Encounter: Payer: Self-pay | Admitting: Medical

## 2024-06-13 ENCOUNTER — Other Ambulatory Visit (HOSPITAL_BASED_OUTPATIENT_CLINIC_OR_DEPARTMENT_OTHER): Payer: Self-pay

## 2024-06-13 MED ORDER — AMPHETAMINE-DEXTROAMPHETAMINE 20 MG PO TABS
20.0000 mg | ORAL_TABLET | Freq: Two times a day (BID) | ORAL | 0 refills | Status: DC
  Filled 2024-06-13: qty 60, 30d supply, fill #0

## 2024-06-13 NOTE — Addendum Note (Signed)
 Addended by: DORINA DALLAS HERO on: 06/13/2024 04:41 PM   Modules accepted: Orders

## 2024-06-17 ENCOUNTER — Telehealth (INDEPENDENT_AMBULATORY_CARE_PROVIDER_SITE_OTHER): Admitting: Medical

## 2024-06-17 ENCOUNTER — Other Ambulatory Visit (HOSPITAL_BASED_OUTPATIENT_CLINIC_OR_DEPARTMENT_OTHER): Payer: Self-pay

## 2024-06-17 ENCOUNTER — Other Ambulatory Visit: Payer: Self-pay

## 2024-06-17 DIAGNOSIS — L089 Local infection of the skin and subcutaneous tissue, unspecified: Secondary | ICD-10-CM

## 2024-06-17 DIAGNOSIS — E559 Vitamin D deficiency, unspecified: Secondary | ICD-10-CM | POA: Diagnosis not present

## 2024-06-17 DIAGNOSIS — L732 Hidradenitis suppurativa: Secondary | ICD-10-CM

## 2024-06-17 DIAGNOSIS — E538 Deficiency of other specified B group vitamins: Secondary | ICD-10-CM | POA: Diagnosis not present

## 2024-06-17 MED ORDER — DOXYCYCLINE HYCLATE 100 MG PO TABS
100.0000 mg | ORAL_TABLET | Freq: Two times a day (BID) | ORAL | 0 refills | Status: AC
  Filled 2024-06-17: qty 20, 10d supply, fill #0

## 2024-06-17 MED ORDER — CLINDAMYCIN PHOS (TWICE-DAILY) 1 % EX GEL
Freq: Two times a day (BID) | CUTANEOUS | 0 refills | Status: AC
  Filled 2024-06-17: qty 30, 30d supply, fill #0

## 2024-06-17 NOTE — Patient Instructions (Signed)
 Hidradenitis suppurativa Recurrent flare on inner thighs with redness and inflammation. No systemic symptoms. Previous treatment with doxycycline  and clindamycin  gel. - Prescribed doxycycline . - Prescribed clindamycin  gel for topical application to tender or inflamed areas. - Advised follow-up if symptoms persist or worsen. -new dermatologist referral placed.  Vitamin B12 deficiency Previous levels showing a downward trend. Last level was 339 four months ago. - Ordered future B12 level test. - Scheduled B12 injection, ensuring it is not done before the B12 level test. - Referred for B12 injection scheduling.  Vitamin D  deficiency Previous low levels. Currently on 50,000 units weekly. - Ordered future vitamin D  level test. - Continue current vitamin D  regimen.   Follow up date to be determined after lab review and depends on response to tx.

## 2024-06-17 NOTE — Progress Notes (Signed)
 Virtual Visit via Video Note  I connected with Tracy Rich on 06/17/2024 at  9:40 AM EST by a video enabled telemedicine application and verified that I am speaking with the correct person using two identifiers.  Location: Patient: home [place] Provider: car [place]. parked   I discussed the limitations of evaluation and management by telemedicine and the availability of in person appointments. The patient expressed understanding and agreed to proceed.  History of Present Illness: Discussed the use of AI scribe software for clinical note transcription with the patient, who gave verbal consent to proceed.     History of Present Illness   Tracy Rich is a 28 year old male with hidradenitis suppurativa who reports rash on his inner thighs.  He has a tender, inflamed rash on the inner thighs. He was previously treated with doxycycline  by his by myself  and in the emergency department for intermittent occurances. He has no fevers, chills, sweats, discharge, or skin breakdown.   In the past referred to dermatologist but pt explains excessive wait and insurance out of network issues.  He has low vitamin D  and is taking 50,000 units weekly after an 8-week high-dose course. He is not taking additional over-the-counter vitamin D .   Has missed recent b12 injection for his b12 deficiency.           Observations/Objective: General-no acute distress, pleasant, oriented. Lungs- on inspection lungs appear unlabored. Neck- no tracheal deviation or jvd on inspection. Neuro- gross motor function appears intact. Derm- no seen since video visit and pt in car.  Assessment and Plan: Assessment and Plan    Hidradenitis suppurativa Recurrent flare on inner thighs with redness and inflammation. No systemic symptoms. Previous treatment with doxycycline  and clindamycin  gel. - Prescribed doxycycline . - Prescribed clindamycin  gel for topical application to tender or inflamed areas. - Advised  follow-up if symptoms persist or worsen. -new dermatologist referral placed.  Vitamin B12 deficiency Previous levels showing a downward trend. Last level was 339 four months ago. - Ordered future B12 level test. - Scheduled B12 injection, ensuring it is not done before the B12 level test. - Referred for B12 injection scheduling.  Vitamin D  deficiency Previous low levels. Currently on 50,000 units weekly. - Ordered future vitamin D  level test. - Continue current vitamin D  regimen.   Follow up date to be determined after lab review and depends on response to tx.         Follow Up Instructions:    I discussed the assessment and treatment plan with the patient. The patient was provided an opportunity to ask questions and all were answered. The patient agreed with the plan and demonstrated an understanding of the instructions.   The patient was advised to call back or seek an in-person evaluation if the symptoms worsen or if the condition fails to improve as anticipated.   Hendricks Schwandt, PA-C

## 2024-06-18 ENCOUNTER — Other Ambulatory Visit (HOSPITAL_BASED_OUTPATIENT_CLINIC_OR_DEPARTMENT_OTHER): Payer: Self-pay

## 2024-07-11 ENCOUNTER — Other Ambulatory Visit: Payer: Self-pay | Admitting: Medical

## 2024-07-11 ENCOUNTER — Encounter: Payer: Self-pay | Admitting: Medical

## 2024-07-11 ENCOUNTER — Other Ambulatory Visit (HOSPITAL_BASED_OUTPATIENT_CLINIC_OR_DEPARTMENT_OTHER): Payer: Self-pay

## 2024-07-11 MED ORDER — AMPHETAMINE-DEXTROAMPHETAMINE 20 MG PO TABS
20.0000 mg | ORAL_TABLET | Freq: Two times a day (BID) | ORAL | 0 refills | Status: AC
  Filled 2024-07-11: qty 60, 30d supply, fill #0

## 2024-07-11 MED ORDER — CLONAZEPAM 1 MG PO TABS
1.0000 mg | ORAL_TABLET | Freq: Two times a day (BID) | ORAL | 1 refills | Status: AC | PRN
  Filled 2024-07-11: qty 60, 30d supply, fill #0

## 2024-07-11 NOTE — Addendum Note (Signed)
 Addended by: DORINA DALLAS DORINA PA-C M on: 07/11/2024 01:59 PM   Modules accepted: Orders

## 2024-07-21 ENCOUNTER — Ambulatory Visit: Payer: Self-pay | Admitting: Medical

## 2024-07-21 ENCOUNTER — Other Ambulatory Visit (HOSPITAL_BASED_OUTPATIENT_CLINIC_OR_DEPARTMENT_OTHER): Payer: Self-pay

## 2024-07-21 ENCOUNTER — Ambulatory Visit: Admitting: Medical

## 2024-07-21 VITALS — BP 112/80 | HR 67 | Temp 98.2°F | Resp 16 | Ht 72.0 in | Wt 269.4 lb

## 2024-07-21 DIAGNOSIS — E559 Vitamin D deficiency, unspecified: Secondary | ICD-10-CM

## 2024-07-21 DIAGNOSIS — M7661 Achilles tendinitis, right leg: Secondary | ICD-10-CM | POA: Diagnosis not present

## 2024-07-21 DIAGNOSIS — F32A Depression, unspecified: Secondary | ICD-10-CM

## 2024-07-21 DIAGNOSIS — E538 Deficiency of other specified B group vitamins: Secondary | ICD-10-CM

## 2024-07-21 DIAGNOSIS — F419 Anxiety disorder, unspecified: Secondary | ICD-10-CM | POA: Diagnosis not present

## 2024-07-21 LAB — VITAMIN B12: Vitamin B-12: 667 pg/mL (ref 211–911)

## 2024-07-21 LAB — VITAMIN D 25 HYDROXY (VIT D DEFICIENCY, FRACTURES): VITD: 16.02 ng/mL — ABNORMAL LOW (ref 30.00–100.00)

## 2024-07-21 MED ORDER — CYANOCOBALAMIN 1000 MCG/ML IJ SOLN
1000.0000 ug | Freq: Once | INTRAMUSCULAR | Status: AC
  Administered 2024-07-21: 1000 ug via INTRAMUSCULAR

## 2024-07-21 MED ORDER — VITAMIN D (ERGOCALCIFEROL) 1.25 MG (50000 UNIT) PO CAPS
50000.0000 [IU] | ORAL_CAPSULE | ORAL | 0 refills | Status: AC
  Filled 2024-07-21: qty 8, 56d supply, fill #0

## 2024-07-21 MED ORDER — MELOXICAM 7.5 MG PO TABS
7.5000 mg | ORAL_TABLET | Freq: Every day | ORAL | 0 refills | Status: AC
  Filled 2024-07-21: qty 30, 30d supply, fill #0

## 2024-07-21 NOTE — Addendum Note (Signed)
 Addended by: DORINA DALLAS DORINA PA-C M on: 07/21/2024 08:34 PM   Modules accepted: Orders

## 2024-07-21 NOTE — Addendum Note (Signed)
 Addended by: GERARD CHUCKIE SAILOR on: 07/21/2024 10:09 AM   Modules accepted: Orders

## 2024-07-21 NOTE — Progress Notes (Signed)
 "  Subjective:    Patient ID: Tracy Rich, male    DOB: 01-06-1996, 29 y.o.   MRN: [id number]  HPI Tracy Rich is a 29 year old male with a history of traumatic right foot injury who presents with recurrent right foot pain.  He has recurrent right foot pain at the prior surgical site that has worsened over the past month and is more severe than in prior years. Pain is localized to the posterior foot near the old traumatic injury and surgery. It is triggered by cold weather and movement, especially dorsiflexion, which he describes as feeling like breaking something. Pain is most limiting in the mornings and restricts foot movement. An old prescription of Flexeril  has not provided relief.  He has not had recent trauma or strenuous activity. He has B12 deficiency, received his last injection in November, and recalls a B12 level of 339 in July.      Review of Systems  Constitutional:  Negative for chills, fatigue and fever.  Respiratory:  Negative for cough, chest tightness and wheezing.   Cardiovascular:  Negative for chest pain and palpitations.  Gastrointestinal:  Negative for abdominal pain, nausea and rectal pain.  Genitourinary:  Negative for dysuria and frequency.  Musculoskeletal:  Negative for back pain.  Skin:  Negative for rash.  Neurological:  Negative for dizziness and seizures.  Hematological:  Negative for adenopathy.  Psychiatric/Behavioral:  Positive for dysphoric mood. Negative for behavioral problems and suicidal ideas. The patient is nervous/anxious.     Past Medical History:  Diagnosis Date   Anxiety    Hydradenitis 01/03/2021   Patient states history of hidradenitis suppurativa     Social History   Socioeconomic History   Marital status: Single    Spouse name: Not on file   Number of children: Not on file   Years of education: Not on file   Highest education level: Not on file  Occupational History   Not on file  Tobacco Use   Smoking status:  Former    Types: Cigarettes   Smokeless tobacco: Never  Vaping Use   Vaping status: Never Used  Substance and Sexual Activity   Alcohol use: No   Drug use: No   Sexual activity: Not on file  Other Topics Concern   Not on file  Social History Narrative   Caffeine- once every other day   Social Drivers of Health   Tobacco Use: Medium Risk (06/06/2024)   Patient History    Smoking Tobacco Use: Former    Smokeless Tobacco Use: Never    Passive Exposure: Not on Actuary Strain: Not on file  Food Insecurity: Not on file  Transportation Needs: Not on file  Physical Activity: Not on file  Stress: Not on file  Social Connections: Unknown (11/15/2021)   Received from [facility]   Social Network    Social Network: Not on file  Intimate Partner Violence: Unknown (10/07/2021)   Received from [facility]   HITS    Physically Hurt: Not on file    Insult or Talk Down To: Not on file    Threaten Physical Harm: Not on file    Scream or Curse: Not on file  Depression (PHQ2-9): Medium Risk (07/21/2024)   Depression (PHQ2-9)    PHQ-2 Score: 7  Alcohol Screen: Not on file  Housing: Not on file  Utilities: Not on file  Health Literacy: Not on file    Past Surgical History:  Procedure Laterality  Date   CHOLECYSTECTOMY N/A 02/12/2023   Procedure: LAPAROSCOPIC CHOLECYSTECTOMY;  Surgeon: Signe Mitzie LABOR, MD;  Location: [facility] OR;  Service: General;  Laterality: N/A;   INTRAOPERATIVE CHOLANGIOGRAM N/A 02/12/2023   Procedure: INTRAOPERATIVE CHOLANGIOGRAM;  Surgeon: Signe Mitzie LABOR, MD;  Location: [facility] OR;  Service: General;  Laterality: N/A;   TONSILLECTOMY     WISDOM TOOTH EXTRACTION      Family History  Problem Relation Age of Onset   Thyroid  disease Mother    Cancer Mother    Hypertension Mother    Thyroid  cancer Mother    GER disease Mother    Healthy Father    GER disease Father    Healthy Sister    Esophageal cancer Neg Hx    Colon cancer Neg Hx      Allergies[1]  Medications Ordered Prior to Encounter[2]  BP 112/80   Pulse 67   Temp 98.2 F (36.8 C) (Oral)   Resp 16   Ht 6' (1.829 m)   Wt 269 lb 6.4 oz (122.2 kg)   SpO2 97%   BMI 36.54 kg/m        Objective:   Physical Exam  General- No acute distress. Pleasant patient. Neck- Full range of motion, no jvd Lungs- Clear, even and unlabored. Heart- regular rate and rhythm. Neurologic- CNII- XII grossly intact.  Rt achilles heel area- medial aspect large scar present. Normal flexion and extension.     Assessment & Plan:   Patient Instructions  Achilles tendinitis of right lower extremity Chronic tendinitis exacerbated by cold weather, severe pain, difficulty with plantar flexion. Old remote injury of tendon when 29 yo. - Prescribed meloxicam  7.5 mg, 30 tablets, as needed for inflammation and pain. - Advised against OTC NSAIDs while on meloxicam . - Referred to sports medicine for further evaluation and possible ultrasound.  Vitamin B12 deficiency Previous B12 level of 339. Last injection in November. Plan to reassess levels before next injection. - Ordered B12 level test. - Will administer B12 injection if levels are low.  Vit d deficiency -will check level today and advise on supplementation after result review  Anxiety and depression Mild symptoms with PHQ-9 score of 7 and GAD-7 score of 6. Previous fluoxetine  trial ineffective. No suicidal ideation. - Referred to behavioral health for psychiatric and therapy evaluation.  Follow up date to be determined after lab review      Referring To Provider Information [facility] [address] [place] [address] [phone]   Referral Start Date: 06/17/2024 Referral End Date: 12/16/202     Dallas Maxwell, PA-C     [1] No Known Allergies [2]  Current Outpatient Medications on File Prior to Visit  Medication Sig Dispense Refill   amphetamine -dextroamphetamine  (ADDERALL) 20  MG tablet Take 1 tablet (20 mg total) by mouth 2 (two) times daily. 60 tablet 0   cephALEXin  (KEFLEX ) 500 MG capsule Take 1 capsule (500 mg total) by mouth 4 (four) times daily. 20 capsule 0   clindamycin  (CLINDAGEL ) 1 % gel Apply topically 2 (two) times daily. 30 g 0   clonazePAM  (KLONOPIN ) 1 MG tablet Take 1 tablet (1 mg total) by mouth 2 (two) times daily as needed for anxiety. 60 tablet 1   doxycycline  (VIBRA -TABS) 100 MG tablet Take 1 tablet (100 mg total) by mouth 2 (two) times daily. 20 tablet 0   famotidine  (PEPCID ) 20 MG tablet Take 1 tablet (20 mg total) by mouth daily. 30 tablet 0   famotidine  (PEPCID ) 20 MG  tablet Take 1 tablet (20 mg total) by mouth 2 (two) times daily. 180 tablet 0   fluticasone  (FLONASE ) 50 MCG/ACT nasal spray Place 2 sprays into both nostrils daily. 16 g 1   fluticasone  (FLONASE ) 50 MCG/ACT nasal spray Place 2 sprays into both nostrils daily. 16 g 1   fluticasone  (FLONASE ) 50 MCG/ACT nasal spray Place 2 sprays into both nostrils daily. 16 g 2   hydrOXYzine  (ATARAX ) 10 MG tablet Take 1-2 tablets (10-20 mg total) by mouth at bedtime as needed for itching 20 tablet 0   levocetirizine (XYZAL ) 5 MG tablet Take 1 tablet (5 mg total) by mouth every evening. 30 tablet 3   metFORMIN  (GLUCOPHAGE ) 500 MG tablet Take 1 tablet (500 mg total) by mouth daily with breakfast. 30 tablet 0   metFORMIN  (GLUCOPHAGE ) 500 MG tablet Take 1 tablet (500 mg total) by mouth daily with breakfast. 90 tablet 3   tiZANidine  (ZANAFLEX ) 4 MG tablet Take 1 tablet (4 mg total) by mouth at bedtime. 5 tablet 0   Vitamin D , Ergocalciferol , (DRISDOL ) 1.25 MG (50000 UNIT) CAPS capsule Take 1 capsule (50,000 Units total) by mouth every 7 (seven) days. 8 capsule 0   [DISCONTINUED] sertraline  (ZOLOFT ) 25 MG tablet Take 1 tablet (25 mg total) by mouth daily. (Patient not taking: Reported on 09/15/2020) 30 tablet 0   No current facility-administered medications on file prior to visit.   "

## 2024-07-21 NOTE — Patient Instructions (Addendum)
 Achilles tendinitis of right lower extremity Chronic tendinitis exacerbated by cold weather, severe pain, difficulty with plantar flexion. Old remote injury of tendon when 29 yo. - Prescribed meloxicam  7.5 mg, 30 tablets, as needed for inflammation and pain. - Advised against OTC NSAIDs while on meloxicam . - Referred to sports medicine for further evaluation and possible ultrasound.  Vitamin B12 deficiency Previous B12 level of 339. Last injection in November. Plan to reassess levels before next injection. - Ordered B12 level test. - Will administer B12 injection if levels are low.  Vit d deficiency -will check level today and advise on supplementation after result review  Anxiety and depression Mild symptoms with PHQ-9 score of 7 and GAD-7 score of 6. Previous fluoxetine  trial ineffective. No suicidal ideation. - Referred to behavioral health for psychiatric and therapy evaluation.  Follow up date to be determined after lab review      Referring To Provider Information [facility] [address] [place] [address] [phone]   Referral Start Date: 06/17/2024 Referral End Date: 12/16/202

## 2024-07-22 ENCOUNTER — Other Ambulatory Visit (HOSPITAL_BASED_OUTPATIENT_CLINIC_OR_DEPARTMENT_OTHER): Payer: Self-pay

## 2024-08-04 ENCOUNTER — Ambulatory Visit: Admitting: Sports Medicine

## 2024-08-05 ENCOUNTER — Other Ambulatory Visit (HOSPITAL_BASED_OUTPATIENT_CLINIC_OR_DEPARTMENT_OTHER): Payer: Self-pay

## 2024-08-05 MED ORDER — CLONAZEPAM 1 MG PO TABS: 1.0000 mg | ORAL_TABLET | Freq: Two times a day (BID) | ORAL | 0 refills | Status: AC

## 2024-08-05 MED ORDER — LAMOTRIGINE 25 MG PO TABS
ORAL_TABLET | ORAL | 0 refills | Status: AC
  Filled 2024-08-05: qty 60, 44d supply, fill #0
